# Patient Record
Sex: Male | Born: 1945
Health system: Southern US, Community
[De-identification: ages and names within clinical notes are randomized; demographics above are authoritative.]

## PROBLEM LIST (undated history)

## (undated) DIAGNOSIS — C801 Malignant (primary) neoplasm, unspecified: Secondary | ICD-10-CM

## (undated) DIAGNOSIS — I739 Peripheral vascular disease, unspecified: Secondary | ICD-10-CM

## (undated) DIAGNOSIS — M79606 Pain in leg, unspecified: Secondary | ICD-10-CM

## (undated) DIAGNOSIS — I714 Abdominal aortic aneurysm, without rupture, unspecified: Secondary | ICD-10-CM

## (undated) DIAGNOSIS — E785 Hyperlipidemia, unspecified: Secondary | ICD-10-CM

## (undated) DIAGNOSIS — I10 Essential (primary) hypertension: Secondary | ICD-10-CM

## (undated) DIAGNOSIS — I219 Acute myocardial infarction, unspecified: Secondary | ICD-10-CM

## (undated) DIAGNOSIS — C44300 Unspecified malignant neoplasm of skin of unspecified part of face: Secondary | ICD-10-CM

## (undated) DIAGNOSIS — I252 Old myocardial infarction: Secondary | ICD-10-CM

## (undated) DIAGNOSIS — I2510 Atherosclerotic heart disease of native coronary artery without angina pectoris: Secondary | ICD-10-CM

## (undated) HISTORY — DX: Abdominal aortic aneurysm, without rupture, unspecified: I71.40

## (undated) HISTORY — DX: Atherosclerotic heart disease of native coronary artery without angina pectoris: I25.10

## (undated) HISTORY — DX: Old myocardial infarction: I25.2

## (undated) HISTORY — PX: CARDIAC CATHETERIZATION: SHX172

## (undated) HISTORY — DX: Peripheral vascular disease, unspecified: I73.9

## (undated) HISTORY — DX: Essential (primary) hypertension: I10

## (undated) HISTORY — DX: Malignant (primary) neoplasm, unspecified: C80.1

## (undated) HISTORY — DX: Hyperlipidemia, unspecified: E78.5

## (undated) HISTORY — DX: Unspecified malignant neoplasm of skin of unspecified part of face: C44.300

## (undated) HISTORY — PX: CORONARY ANGIOPLASTY WITH STENT PLACEMENT: SHX49

## (undated) HISTORY — DX: Acute myocardial infarction, unspecified: I21.9

## (undated) HISTORY — DX: Abdominal aortic aneurysm, without rupture: I71.4

## (undated) HISTORY — DX: Pain in leg, unspecified: M79.606

## (undated) HISTORY — PX: CORONARY ARTERY BYPASS GRAFT: SHX141

---

## 2003-11-21 DIAGNOSIS — C801 Malignant (primary) neoplasm, unspecified: Secondary | ICD-10-CM

## 2003-11-21 HISTORY — DX: Malignant (primary) neoplasm, unspecified: C80.1

## 2008-10-24 ENCOUNTER — Inpatient Hospital Stay (HOSPITAL_COMMUNITY): Admission: EM | Admit: 2008-10-24 | Discharge: 2008-10-29 | Payer: Self-pay | Admitting: Emergency Medicine

## 2008-10-24 DIAGNOSIS — I219 Acute myocardial infarction, unspecified: Secondary | ICD-10-CM

## 2008-10-24 HISTORY — DX: Acute myocardial infarction, unspecified: I21.9

## 2008-10-25 ENCOUNTER — Ambulatory Visit: Payer: Self-pay | Admitting: Surgery

## 2008-11-16 ENCOUNTER — Ambulatory Visit: Payer: Self-pay | Admitting: Surgery

## 2008-11-16 ENCOUNTER — Encounter: Admission: RE | Admit: 2008-11-16 | Discharge: 2008-11-16 | Payer: Self-pay | Admitting: Surgery

## 2008-11-26 ENCOUNTER — Ambulatory Visit: Payer: Self-pay | Admitting: Surgery

## 2008-11-26 ENCOUNTER — Ambulatory Visit (HOSPITAL_COMMUNITY): Admission: RE | Admit: 2008-11-26 | Discharge: 2008-11-26 | Payer: Self-pay | Admitting: Surgery

## 2008-11-26 HISTORY — PX: OTHER SURGICAL HISTORY: SHX169

## 2008-12-14 ENCOUNTER — Ambulatory Visit: Payer: Self-pay | Admitting: Surgery

## 2008-12-23 ENCOUNTER — Encounter (HOSPITAL_COMMUNITY): Admission: RE | Admit: 2008-12-23 | Discharge: 2009-01-22 | Payer: Self-pay | Admitting: Cardiovascular Disease

## 2009-01-18 ENCOUNTER — Ambulatory Visit: Payer: Self-pay | Admitting: Surgery

## 2009-01-22 ENCOUNTER — Encounter (HOSPITAL_COMMUNITY): Admission: RE | Admit: 2009-01-22 | Discharge: 2009-02-21 | Payer: Self-pay | Admitting: Cardiovascular Disease

## 2009-02-22 ENCOUNTER — Encounter (HOSPITAL_COMMUNITY): Admission: RE | Admit: 2009-02-22 | Discharge: 2009-03-24 | Payer: Self-pay | Admitting: Cardiovascular Disease

## 2009-05-31 ENCOUNTER — Ambulatory Visit: Payer: Self-pay | Admitting: Surgery

## 2010-07-06 ENCOUNTER — Ambulatory Visit: Payer: Self-pay | Admitting: Cardiovascular Disease

## 2010-10-03 ENCOUNTER — Ambulatory Visit: Payer: Self-pay | Admitting: Surgery

## 2011-01-09 ENCOUNTER — Ambulatory Visit (INDEPENDENT_AMBULATORY_CARE_PROVIDER_SITE_OTHER): Payer: BC Managed Care – PPO | Admitting: Cardiovascular Disease

## 2011-01-09 DIAGNOSIS — E7800 Pure hypercholesterolemia, unspecified: Secondary | ICD-10-CM

## 2011-01-09 DIAGNOSIS — I119 Hypertensive heart disease without heart failure: Secondary | ICD-10-CM

## 2011-03-06 LAB — POCT I-STAT, CHEM 8
Calcium, Ion: 1.16 mmol/L (ref 1.12–1.32)
Glucose, Bld: 103 mg/dL — ABNORMAL HIGH (ref 70–99)
HCT: 42 % (ref 39.0–52.0)
Hemoglobin: 14.3 g/dL (ref 13.0–17.0)
TCO2: 27 mmol/L (ref 0–100)

## 2011-04-04 NOTE — Discharge Summary (Signed)
NAMEGAYNOR, FERRERAS               ACCOUNT NO.:  [id number]   MEDICAL RECORD NO.:  [id number]          PATIENT TYPE:  INP   LOCATION:  6527                         FACILITY:  [facility]   PHYSICIAN:  Vesta Mixer, M.D. DATE OF BIRTH:  [birth year]   DATE OF ADMISSION:  10/24/2008  DATE OF DISCHARGE:  10/29/2008                               DISCHARGE SUMMARY   Donald Mckinney is a 65 year old gentleman who was admitted from the  emergency room on October 24, 2008, with an acute inferior wall  myocardial infarction.   DISCHARGE DIAGNOSES:  1. Inferior wall myocardial infarction - status post percutaneous      transluminal coronary angioplasty and stent placement to the right      coronary artery.  He is also status post percutaneous transluminal      coronary angioplasty and stent placement to the left and mid left      anterior descending artery.  2. Dyslipidemia.  3. History of cigarette smoking.  4. Abdominal aortic aneurysm.  5. Moderate-to-severe peripheral vascular disease involving the distal      aorta and bilateral iliacs.   DISCHARGE MEDICATIONS:  1. Aspen 81 mg a day.  2. Plavix 75 mg a day.  3. Nitroglycerin 0.4 mg sublingually as needed for chest pain.  4. Crestor 20 mg at night.  5. Nicoderm patch 14 mg a day and taper as directed by the      instructions.  6. Carvedilol 3.125 mg twice a day.  7. Xanax 0.5 mg in a tapered fashion.   DISPOSITION:  The patient will see Dr. Elease Hashimoto in 1-2 weeks.  He will see  Dr. Myra Gianotti in 1-2 weeks as well.  He is to call for appointments on  both of those.  We will arrange for him to be seen at the [facility]  Cardiac Rehab Department.   HISTORY:  Donald Mckinney is a 65 year old gentleman with a strong family  history of coronary artery disease and hypercholesterolemia.  He has a  long history of cigarette smoking.  He was admitted through the  emergency room as an unassigned patient.  We were asked to seen by the  emergency room  doctors.  He was found to have an acute ST-segment  elevation myocardial infarction.   That evening he underwent successful PTCA and placement of a 2.5 x 32-mm  Taxus stent.  It was postdilated using a 2.75 Voyager [place] up to 16  atmospheres.   At that time, he was noted to have a tight mid left anterior descending  artery stenosis of about 70-80%.  We also noted that he had significant  peripheral vascular disease and had great difficulty in getting up into  a central circulation.   We allowed him to recover from his heart attack and then on October 28, 2008, we proceeded with PTCA and stenting of the left anterior  descending artery with placement of a 2.75 x 15 mm Xience drug-eluting  stent.  Poststent dilatation was achieved using a 3.0 x 12 mm [place] Voyager  up to 12 atmospheres.   The patient  tolerated that procedure quite well.  He will follow up with  Dr. Elease Hashimoto.  He has been instructed to take Plavix for at least a year  or so.   Peripheral vascular disease.  The patient has a long history of  claudication.  He was seen by Dr. Durene Cal.  At the end of the left  anterior descending artery PCI case, we performed a distal aortogram,  which revealed a moderate-sized infrarenal abdominal aortic aneurysm.  He had a stenosis just distal to this aneurysm and then had moderate-to-  severe bilateral peripheral vascular disease involving the entire iliac  arteries.  The disease extended down into the femoral arteries, but we  were not able to see any further than that.  The patient will follow up  with Dr. Myra Gianotti and will quite likely need to have further  investigations and quite possibly may need revascularization.   Hyperlipidemia.  The patient has a long history of hypercholesterolemia,  but has never taken cholesterol medications.  Fasting lipid level  revealed an LDL of 260.  We started him on Crestor 20 mg a day.  He was  scared to take statins because he had multiple  brothers who did not  tolerate statins but so far he seems to be tolerating it fairly well.   Smoking cessation.  The patient smokes 1-2 packs of cigarettes a day.  We placed a nicotine patch on him and gave him some Ativan and he  tolerated this fairly well.  He will continue with the nicotine patch  and tapering Xanax dose.   Alcohol abuse.  The patient has a long history of alcoholism.  We will  continue with the Xanax and he will gradually taper that dose.  He has  been instructed to drink alcohol only in moderation.  All his other  medical problems are stable.      Vesta Mixer, M.D.  Electronically Signed     PJN/MEDQ  D:  10/29/2008  T:  10/29/2008  Job:  [id number]   cc:   Jorge Ny, MD  [facility] Cardiac Rehab Department

## 2011-04-04 NOTE — Assessment & Plan Note (Signed)
OFFICE VISIT   FENIX, RORKE  DOB:  Jul 28, 1946                                       01/18/2009  UEAVW#:[id number]   REASON FOR VISIT:  Follow-up.   HISTORY:  This is a 65 year old gentleman who initially presented to the  emergency department with an acute MI.  He underwent coronary  intervention.  During his hospital stay he was worked up for a  chronically ischemic right leg.  He has undergone arteriogram on January  7th and found to have high-grade stenosis at the origin of his iliac  arteries for which he underwent kissing stent placement.  His external  iliac artery was also stented.  The patient does have residual lower  extremity disease, specifically in his profunda and superficial femoral  arteries.  Due to his coronary stent placement, he is on Plavix.  I do  not think he has additional percutaneous options, his next option would  be an open repair.  When I last saw him he had improved from a lower  extremity standpoint, I elected to manage him medically and we added  cilostazol to his medication regimen.  He comes back in today to see if  this has had any benefit.  He states that he is still having some  trouble with his right foot; however, he is able to participate in  cardiac rehab.  He occasionally has rest pain.  He does not have any  ulceration.   PHYSICAL EXAMINATION:  He is well-appearing, in no distress.  His blood  pressure is 138/78, pulse is 77.  His feet are warm and well-perfused.  He does not have palpable pulses.  There is no ulceration.   DIAGNOSTIC STUDIES:  Ankle brachial index was performed today, this is  0.32 on the right, which is up from undetectable, and 0.62 on the left,  which is up from 0.58.  The patient also underwent a carotid duplex  which reveals no evidence of internal carotid stenosis bilaterally.   ASSESSMENT/PLAN:  Right lower extremity peripheral vascular disease.   PLAN:  The patient continues to get  by, with regards to his peripheral  vascular disease.  I think his intervention will need to be a surgical  bypass.  I would prefer this to be an aortobifemoral, however, the  timing at which this needs to be done he may require an ax-bi-fem.  He  potentially could require an additional femoral popliteal bypass to help  with his symptoms.  Since he has been stable with his symptoms, I think  we can continue to manage him medically and expectantly.  He is going to  come back to see me in 3 months.  If he has any problems in the interim  he will contact me; specifically, if he has any ulceration.  He is going  to continue cilostazol for another month.   Jorge Ny, MD  Electronically Signed   VWB/MEDQ  D:  01/18/2009  T:  01/19/2009  Job:  [id number]   cc:   Vesta Mixer, M.D.

## 2011-04-04 NOTE — Assessment & Plan Note (Signed)
OFFICE VISIT   Donald Mckinney, Donald Mckinney  DOB:  Dec 11, 1945                                       11/16/2008  ZOXWR#:[id number]   REASON FOR VISIT:  Leg pain.   HISTORY:  This is a 65 year old gentleman who presented to the emergency  department with acute MI.  He was taken for percutaneous transluminal  coronary angioplasty and stent placement to the right coronary artery.  Following his procedure, the patient began complaining of pain in his  right foot.  I was consulted for possible ischemic leg.  Upon my  questioning the patient, he stated that he has been dealing with rest  pain for several months, to the point that it keeps him awake most  nights and requires him to hang his leg over the bed for relief.  We  kept a close eye on his legs, which did not change.  He was  neurologically intact throughout these events.  He additionally  underwent stenting of his left and mid left anterior descending artery  several days later, and was discharged to home on Plavix.  The patient  states that his rest pain has improved somewhat, although he still is  unable to participate in cardiac rehab given his claudication.  The  patient had a CT scan prior to seeing me today.   PHYSICAL EXAMINATION:  He is in no acute distress.  Cardiovascular:  Regular rate and rhythm.  Extremities are warm and well perfused.  Abdomen is soft.   CT SCAN:  The patient's CT scan shows extensive atherosclerotic disease  in his lower extremity.  He had multilevel disease.   ASSESSMENT AND PLAN:  Bilateral claudication, right leg rest pain.   PLAN:  The patient is scheduled to undergo lower extremity arteriogram  with possible percutaneous intervention.  The patient has common and  external iliac disease.  I discussed with him he will most likely need  bilateral access to potentially extend his aortic bifurcation.  I plan  on just treating his inflow disease at this time and getting better  detail of his outflow disease and runoff.  We discussed the risk of  bleeding, contrast, complications, and embolization.  The patient is on  the schedule for Thursday, January 7.  He is supposed to see Dr. Elease Hashimoto  on January 12.  As long as Dr. Elease Hashimoto is okay with me proceeding on this  date, we will go ahead.  The patient is going to contact Dr. Elease Hashimoto and  call me to reschedule if he would like to wait until he sees him before  preceding with lower extremity intervention.  The patient is already on  Plavix, so he will not need to be loaded.   Jorge Ny, MD  Electronically Signed   VWB/MEDQ  D:  11/16/2008  T:  11/17/2008  Job:  [id number]   cc:   Vesta Mixer, M.D.

## 2011-04-04 NOTE — Assessment & Plan Note (Signed)
OFFICE VISIT   Mckinney, Donald  DOB:  [birth year]                                       05/31/2009  ZOXWR#:[id number]   REASON FOR VISIT:  Followup.   HISTORY:  This is a 65 year old gentleman who I initially met when he  presented with an MI.  He underwent percutaneous coronary intervention.  He developed a painful right leg following his procedure.  On further  evaluation, he has chronically had rest pain; however, it was more  pronounced after his cardiac catheterization.  He did undergo lower  extremity arteriogram on November 26, 2008 and found to have high-grade  stenosis at the origin of his bilateral iliac arteries and underwent  kissing stent placement.  He also had stenting of his external iliac  artery.  He was found to have residual lower extremity disease in his  profunda and superficial femoral artery.  Based on his arteriogram, he  was found to not be a candidate for additional percutaneous procedures  and would require an open operation.  Since he has been on Plavix, I  have elected to manage him medically at this time.  He has been started  on cilostazol.  He comes back in today for followup.  He states he is  also doing some form of massage therapy to his right leg which is his  more symptomatic leg.  He is now sleeping through the night and out  playing golf.  Overall I think he is much improved.   PHYSICAL EXAMINATION:  Blood pressure 156/88, pulse 71.  General:  He is  well-appearing, no distress.  His legs are warm and well perfused.  There is no ulceration.   DIAGNOSTIC STUDIES:  ABIs were performed today which reveal ABI of 0.32  on the right and 0.62 on the left.  His bilateral common iliac and right  external iliac stents are widely patent.   ASSESSMENT/PLAN:  Severe peripheral vascular disease.   PLAN:  We will continue to manage the patient medically.  As long as he  is continuing to be without rest pain and without nonhealing  wounds, I  would not recommend intervention until he can come off his Plavix which  will likely be in the distant future.  I plan on seeing him back in a  year with repeat ankle-brachial indices.  Should he develop worsening  symptoms prior to that, he has been instructed to contact me.   Jorge Ny, MD  Electronically Signed   VWB/MEDQ  D:  05/31/2009  T:  06/01/2009  Job:  [id number]   cc:   Vesta Mixer, M.D.

## 2011-04-04 NOTE — H&P (Signed)
Donald Mckinney               ACCOUNT NO.:  [id number]   MEDICAL RECORD NO.:  [id number]          PATIENT TYPE:  INP   LOCATION:  2914                         FACILITY:  [facility]   PHYSICIAN:  Vesta Mixer, M.D. DATE OF BIRTH:  Feb 20, 1946   DATE OF ADMISSION:  10/24/2008  DATE OF DISCHARGE:                              HISTORY & PHYSICAL   Donald Mckinney is a 65 year old gentleman who is admitted from the ER for  an acute inferior wall myocardial infarction.  He filled up on  unassigned cardiology call.   Donald Mckinney does not have a regular medical doctor.  He knows that he  has a history of hypercholesterolemia and he has a strong family history  of hypercholesterolemia.  He presented to the hospital after a sudden  episode of syncope.  The episode of syncope happened around 7 o'clock.  Following that, he had some diffuse chest pressure that was described as  about 5/10.  The pressure was in the middle of his chest.  There was  some diaphoresis and some shortness of breath.  The pain did not  resolve.  He called his family who ultimately called the EMS.  He was  found to have acute ST-segment elevation in the inferior leads and was  brought emergently to [facility].   The patient denies any previous episodes of syncope or presyncope.  He  denies any PND or orthopnea.   CURRENT MEDICATIONS:  None.   PAST MEDICAL HISTORY:  Hypercholesterolemia.   SOCIAL HISTORY:  The patient smokes 2 packs of cigarettes a day.  He  drinks 5-6 alcoholic beverages a day.  He works as a Visual merchandiser.  He used to  work in [employer].   FAMILY HISTORY:  Strongly positive for coronary artery disease and  hypercholesterolemia.   REVIEW OF SYSTEMS:  All systems are reviewed and are negative.   PHYSICAL EXAMINATION:  GENERAL:  He is a middle-aged gentleman in no  acute distress.  He is alert and oriented x3.  His mood and affect are  normal.  VITAL SIGNS:  His blood pressure is 100/60, heart  rate 60.  HEENT:  No JVD, no thyromegaly.  He is normocephalic and atraumatic.  His sclerae are nonicteric.  LUNGS:  Clear.  HEART:  Regular rate, S1 and S2.  ABDOMEN:  Nontender.  EXTREMITIES:  He has 1+ pulses on the right side.  He has 1+ pulses  distally.  NEUROLOGIC:  Nonfocal.   His EKG reveals normal sinus rhythm.  He has ST-segment elevation  inferiorly.   IMPRESSION AND PLAN:  Acute myocardial infarction.  The patient presents  with syncope and episodes of chest pain.  He is clearly having an acute  inferior wall myocardial infarction.  We have discussed the risks,  benefits, and options concerning heart catheterization.  He understands  and agrees to go to the cath lab.  Further plans following heart  catheterization.  All of his other medical problems remain fairly  stable.      Vesta Mixer, M.D.  Electronically Signed     PJN/MEDQ  D:  10/25/2008  T:  10/25/2008  Job:  [id number]

## 2011-04-04 NOTE — Assessment & Plan Note (Signed)
OFFICE VISIT   Donald Mckinney, Donald Mckinney  DOB:  03-27-1946                                       10/03/2010  ZOXWR#:[id number]   REASON FOR VISIT:  Followup claudication.   HISTORY:  This is a 65 year old gentleman who suffered an MI in  December/January of 2010.  He underwent a cardiac catheterization for  this and in the recovery phase he was found to have significant rest  pain in his right leg.  He has ultimately undergone stenting of his  right external iliac artery.  He does have residual disease in his leg,  however, he has been without symptoms.  He is back playing golf and  working.  He does not have rest pain.  He is taking cilostazol.   PHYSICAL EXAMINATION:  Vital signs:  Heart rate 76, blood pressure of  155/83, respiratory rate 18.  General:  He is well-appearing, in no  distress.  Legs are warm and well-perfused.  Pedal pulses are not  palpable.  There is no edema.   DIAGNOSTIC STUDIES:  ABIs were reviewed today.  His ABIs are stable from  his previous study, the right is 0.42, the left is 057.   ASSESSMENT AND PLAN:  Status post right external iliac stent.  The  patient is asymptomatic at this time with stable ABIs.  We will continue  to follow him yearly.  If the symptoms change he is instructed to  contact me.     Jorge Ny, MD  Electronically Signed   VWB/MEDQ  D:  10/03/2010  T:  10/04/2010  Job:  [id number]   cc:   Vesta Mixer, M.D.

## 2011-04-04 NOTE — Cardiovascular Report (Signed)
[name], Mckinney               ACCOUNT NO.:  [id number]   MEDICAL RECORD NO.:  [id number]          PATIENT TYPE:  INP   LOCATION:  2914                         FACILITY:  [facility]   PHYSICIAN:  Vesta Mixer, M.D. DATE OF BIRTH:  12/24/1945   DATE OF PROCEDURE:  DATE OF DISCHARGE:                            CARDIAC CATHETERIZATION   Donald Mckinney is a 65 year old gentleman with a history of  hypercholesterolemia and cigarette smoking.  He presents today after  having an episode of syncope.  His family called EMS and was found to  have an inferior wall myocardial infarction.  He was brought urgently to  the cath lab for further evaluation.   The procedure was left heart catheterization with coronary angiography  with PTCA and stenting of the right coronary artery.   The right femoral artery was easily cannulated using modified Seldinger  technique.  There was significant plaque in his iliac artery and  required a Wholey wire to get up into the central circulation.  The  catheters were exchanged over a guidewire.   HEMODYNAMIC:  LV pressure is 90/9 with aortic pressure of 90/54.   Angiography left main, the left main is fairly normal.   The left anterior descending artery has a 70-80% eccentric stenosis in  the proximal segment.  This is right at the takeoff of the first  diagonal artery.  The first diagonal artery has mild irregularities and  about a 50% proximal stenosis at its takeoff.  The second diagonal  artery is unremarkable.  The distal LAD is fairly unremarkable.   A left circumflex artery is fairly normal.  It gives off a large first  obtuse marginal artery which is normal.   The right coronary artery is occluded.   The left ventriculogram was actually performed at the end of the case.  Reveals overall well preserved left ventricular systolic function.  Ejection fraction is around 55%.  There is no mitral regurgitation.   PCI of the right coronary artery was  engaged using the Judkins 6-French  JR-4 side-hole guide.  Angiography revealed an occluded right coronary  artery in its proximal segment.  Angiomax was given.  ACT initially was  215.  Another bolus was given at the Angiomax.  The second ACT was 327.   A Prowater angioplasty wire was positioned down into the distal right  coronary artery.  A 2.5 x 15-mm apex was positioned across the stenosis.  We actually achieved reperfusion with the wire.  We were able to open up  the vessel and then the patient had some bradycardia.  We quickly  reinflated the balloon and then did a series of 10 very short inflations  with very short reperfusion time somewhat in an effort to allow the  patient to reperfuse slowly.  He did have some bradycardia and sinus  pauses.  We gave atropine 1 mg IV which corrected the problem.   After the patient had stabilized and his rhythm had stabilized, we took  a 2.5 x 32 mm Taxus stent.  It was placed across the entire mid vessel  and was deployed  at 14 atmospheres for 23 seconds.  Post stent  dilatation was achieved using a 2.75 x 20 mm Voyager [place].  It was  positioned distally and inflated up to 12 atmospheres for 18 seconds.  It was then brought back into the middle of the stent and inflated up to  18 atmospheres for 16 seconds and then 22 atmospheres for 25 seconds.  This gave [place] a very nice angiographic result of the stent.   There seemed to be a dissection that extended from the midvessel down  into the distal vessel.  The distal aspect of this flap was still  visible, but otherwise fairly normal segment.  We watched this segment  for a while and did not seem to obstruct flow.  Our plan is to treat  this with Plavix and let heal upon its own.  The patient will need to be  brought back for intervention on that as his left anterior descending  artery.  We will follow up with restudy of his distal right coronary  artery at that time.  We will continue to treat with  Plavix.   The patient will need aggressive therapy with Crestor as he has a  history of hypercholesterolemia and a significant coronary artery  disease.      Vesta Mixer, M.D.  Electronically Signed     PJN/MEDQ  D:  10/24/2008  T:  10/25/2008  Job:  [id number]

## 2011-04-04 NOTE — Op Note (Signed)
Donald Mckinney, Donald Mckinney               ACCOUNT NO.:  [id number]   MEDICAL RECORD NO.:  [id number]          PATIENT TYPE:  AMB   LOCATION:  SDS                          FACILITY:  [facility]   PHYSICIAN:  Juleen China IV, MDDATE OF BIRTH:  10-04-1946   DATE OF PROCEDURE:  11/26/2008  DATE OF DISCHARGE:  11/26/2008                               OPERATIVE REPORT   PREOPERATIVE DIAGNOSIS:  Right leg rest pain.   POSTOPERATIVE DIAGNOSIS:  Right leg rest pain.   PROCEDURES PERFORMED:  1. Ultrasound access, left femoral artery.  2. Ultrasound access, right common femoral artery.  3. Abdominal aortogram.  4. Second-order catheterization (right external iliac artery).  5. Catheter in aorta from the right leg.  6. Right lower extremity runoff.  7. Stent, right common iliac artery.  8. Stent, left common iliac artery.  9. Stent, right external iliac artery.   CONSCIOUS SEDATION:  Times 1 hour from 12:04 to 1:04.   INDICATIONS:  This is a 65 year old gentleman who presented with an  acute MI and underwent subsequent cardiac stenting by Dr. Elease Hashimoto.  During his stay, he was having difficulty with right foot pain which was  a subacute problem for him.  He was discharged from the hospital after  recovering and comes in for further evaluation of his right leg.  I did  got a CT scan in the hospital which shows inflow, outflow, and runoff  disease.  He comes in today for an arteriogram and possible intervention  to better assist him and his ability to participate in cardiac rehab.   PROCEDURE IN DETAIL:  The patient was identified in the holding and  taken to room 8.  He was placed supine on the table.  Bilateral groins  were prepped and draped in standard sterile fashion.  Time-out was  called.  The left femoral artery was divided with ultrasound and found  to be patent.  Lidocaine 1% was used for local anesthesia.  The left  femoral artery was accessed under ultrasound guidance with an 18-gauge  needle.  A 0.035 Bentson wire was advanced into the aorta under  fluoroscopic visualization and a 5-French sheath was placed.  Over the  wire, Omni flush catheter was placed to the level of L1 and abdominal  aortogram was obtained.  Next, the catheter was pulled down to the  aortic bifurcation and pelvic angiogram was obtained.  Using the Omni  flush catheter and Bentson wire, the aortic bifurcation was crossed.  The catheter was placed in the right external iliac artery and right  lower extremity runoff was obtained.   FINDINGS:  Abdominal aortogram:  The visualized portions of suprarenal  abdominal aorta showed minimal disease.  There are single renal arteries  bilaterally which are widely patent.  There is mild disease within the  infrarenal abdominal aorta and the distal aorta is ectatic.   Pelvic angiogram:  There is stenosis at the origin of the right and left  common iliac artery.  There is mild diffuse disease throughout the right  common iliac artery.  The right internal iliac artery is widely  patent.  The right external iliac artery is diffusely diseased.  The left iliac  artery is patent after its origin of stenosis.  The left hypogastric  artery is diffusely diseased but patent.  There is diffuse disease  throughout the left external iliac artery.   Right lower extremity:  The right common femoral artery is patent.  The  right superficial femoral and profunda femoral artery are occluded.  There is reconstitution of the profunda femoral artery just distal to  its origin.  The popliteal artery above the knee reconstitutes, however,  it is diffusely diseased.  The below-knee popliteal artery is patent.  The patient appears to have three-vessel runoff, however, this was a  somewhat limited evaluation due to proximal disease and patient  movement.   INTERVENTION:  At this point in time, I elected to proceed with treating  the patient's inflow disease.  The right femoral artery  was evaluated  with ultrasound and found to be patent.  Lidocaine 1% was used for local  anesthesia.  It was accessed under ultrasound guidance with an 18-gauge  needle.  Using a Bentson wire, access was obtained into the common iliac  artery and a 7-French sheath was then placed.  Using a Kumpe catheter,  wire access was obtained into the aorta.  Wire exchange was then  performed on both sides to place a Rosen wire and a 7-French sheath was  placed from the left groin.  I elected to place kissing iliac stents.  Abbott balloon expandable stents measuring 9 x 28 were placed  bilaterally.  Balloon was taken to 12 atmospheres.  Followup study  revealed significantly improved blood flow across the aortic  bifurcation.  Next, I elected to treat the right external iliac artery.  This was done using a Abbott self-expanding stent 7 x 60.  It was molded  to confirmation with a 6-mm balloon.  The stent landed at the takeoff of  the hypogastric artery and came down into the external iliac artery.  The patient's followup study revealed significantly improved results.  The patient still has a femoral disease which is best not treated  percutaneously.  However, due to the limitations he was with his recent  cardiac history, I think his exercise tolerance will be improved and he  should relieved of his rest pain.   Please note that the patient was given systemic heparinization while the  7-French sheath was placed.  After these results, the patient was taken  to the holding area for sheath pull once his coagulation profile  corrected.   IMPRESSION:  1. Aortic bifurcation stenosis, successfully treated using balloon      expandable 9 x 28 kissing iliac stents.  2. Diffuse disease throughout the right external iliac artery treated      with a self-expanding 7 x 60 stent.  3. Occluded superficial femoral and profunda femoral artery on the      right.  There is reconstitution of the profunda distal to  its      takeoff.  There is reconstitution of the below-knee popliteal      artery with a three-vessel runoff.           ______________________________  V. Charlena Cross, MD  Electronically Signed     VWB/MEDQ  D:  11/26/2008  T:  11/27/2008  Job:  [id number]   cc:   Vesta Mixer, M.D.

## 2011-04-04 NOTE — Cardiovascular Report (Signed)
NAMETRACE, WIRICK               ACCOUNT NO.:  [id number]   MEDICAL RECORD NO.:  [id number]          PATIENT TYPE:  INP   LOCATION:  6527                         FACILITY:  [facility]   PHYSICIAN:  Vesta Mixer, M.D. DATE OF BIRTH:  [birth year]   DATE OF PROCEDURE:  10/28/2008  DATE OF DISCHARGE:                            CARDIAC CATHETERIZATION   HISTORY:  Donald Mckinney is a 65 year old gentleman who presented several  days ago with an acute inferior myocardial epicardium.  He underwent  sick successful emergent PTCA and stenting of his right coronary artery.  At that time, he was noted to have a significant stenosis in his left  anterior descending artery.  We also noticed that he had severe  peripheral vascular disease and we had great difficulty getting up into  the central circulation.  He is brought back today for the second of his  stage procedure for angioplasty of the left anterior descending artery.  We also wanted to check the right coronary artery since he had a  dissection at the end of the case.  We also wanted to do an aortogram  for further evaluation of his peripheral vascular disease.   The left femoral artery was easily cannulated using modified Seldinger  technique.  We had difficulty in getting up with the curved J-wire, so a  straight Glidewire was used.  We were able to pass it without difficulty  into the central circulation.  All catheters were exchanged over the  guidewire.   Aortic pressure was 89/52.   A right diagnostic catheter was placed into the right coronary artery.  This revealed a widely patent stent with no residual restenosis.  There  is no edge dissection.  The question of the dissection flap that was in  the distal right coronary artery has now healed and there is no evidence  of any linear dissection.  There is brisk flow down the right coronary  artery.   The right coronary catheter was traded out for left 4 guide.  Angiomax  was given.   The ACT was 343.  Prowater angioplasty wire was placed down  into the distal left anterior descending artery.   Angiography revealed a tight 80% stenosis in the mid left anterior  descending artery just between the first and second diagonal vessels.   Initial pre-dilatation was achieved using a 2.7, 5 x 12 mm apex balloon.  We inflated it up to 6 atmospheres for 30 seconds.  At this point, a  2.75 x 15 mm Xience (drug-eluting stent) was positioned across the  stenosis.  It was deployed at 12 atmospheres for 36 seconds.   Following this, post-stent dilatation was achieved using a 3.0 x 12 mm  Voyager.  We inflated it distally up to 12 mm for 15 seconds and then  proximally 12 mm for 15 seconds giving a final size of 3.01 mm.  This  gave [place] a very nice angiographic result.  There was no evidence of edge  dissection and there appears to be full apposition.  There is brisk flow  down each of the diagonal arteries.  Following this, distal aortogram was performed at 45 mL at a rate of 12  mL per second.  The distal aortogram revealed patent renal arteries.  He  has a small to moderate sized abdominal aortic aneurysm just before the  bifurcation.  There is then a stenosis in the aorta.  There is severe  and diffuse disease involving both iliac arteries.  We could not come  down much further than the iliac arteries.   COMPLICATIONS:  None.   CONCLUSION:  1. Successful PTCA and stenting of the left anterior descending artery      (mid LAD).  2. Good short-term result of the right coronary artery stent based on      October 24, 2008.  The distal dissection flap has now healed      nicely.  3. Significant peripheral vascular disease with an abdominal aortic      aneurysm as well as an aortic stenosis and severe bilateral iliac      artery disease.  We will continue to treat the patient medically.      He needs aggressive cholesterol reduction.  He may have stopped      smoking.  He  certainly has not smoked in the hospital.  He will      need further vascular studies by Dr. Myra Gianotti.      Vesta Mixer, M.D.  Electronically Signed     PJN/MEDQ  D:  10/28/2008  T:  10/28/2008  Job:  [id number]   cc:   Jorge Ny, MD

## 2011-04-04 NOTE — Procedures (Signed)
AORTA-ILIAC DUPLEX EVALUATION   INDICATION:  Follow up lower extremity revascularization.   HISTORY:  Diabetes:  No.  Cardiac:  MI.  Hypertension:  No.  Smoking:  Previous.  Previous Surgery:  On 11/26/08, bilateral common iliac artery and right  external iliac artery stents.               SINGLE LEVEL ARTERIAL EXAM                              RIGHT                  LEFT  Brachial:                  152                    151  Anterior tibial:           56                     84  Posterior tibial:                                 89  Peroneal:                  44  Ankle/brachial index:      0.37                   0.59  Previous ABI/date:         01/18/09, 0.32         01/18/09, 0.62   AORTA-ILIAC DUPLEX EXAM  Aorta - Proximal     69 cm/s  Aorta - Mid          52 cm/s  Aorta - Distal       84 cm/s   RIGHT                                   LEFT  85 cm/s           CIA-PROXIMAL          124 cm/s  133 cm/s          CIA-DISTAL            159 cm/s  202 cm/s          HYPOGASTRIC           261 cm/s  119 cm/s          EIA-PROXIMAL          242 cm/s  61 cm/s           EIA-MID               171 cm/s  79 cm/s           EIA-DISTAL            222 cm/s   IMPRESSION:  1. Bilateral ankle brachial indices appear stable from previous study.  2. Patent bilateral common iliac artery and right external iliac      artery stents.  3. Mildly elevated velocities in the left external iliac artery and      bilateral hypogastric arteries.   ___________________________________________  V. Charlena Cross, MD   AS/MEDQ  D:  05/31/2009  T:  05/31/2009  Job:  [id number]

## 2011-04-04 NOTE — Assessment & Plan Note (Signed)
OFFICE VISIT   Donald Mckinney, Donald Mckinney  DOB:  Jul 16, 1946                                       12/14/2008  YQMVH#:[id number]   REASON FOR VISIT:  Follow-up.   HISTORY:  This is a 65 year old gentleman who initially presented to the  emergency department with an acute MI.  He underwent percutaneous  transluminal coronary angioplasty and stent placement in his right  coronary artery.  Following this, there is concern over an ischemic leg.  Upon my questioning the patient had been dealing with rest pain for  several months to the point where it was keeping him up most nights and  requiring him to hang his foot over the bed for relief.  We kept a very  close eye on his right leg and it did not change throughout his stay.  He was neurologically intact with exception of some mild sensation  changes.  Several days later he underwent stenting of his left and mid  left anterior descending artery.  He was discharged home on Plavix.  I  took him to the angiogram suite for evaluation of his lower extremity on  November 26, 2008.  At that time high-grade lesions were found at the  origin of his right common iliac artery.  He underwent kissing iliac  stents.  He also had a severe stenosis throughout his right external  iliac artery, which was subsequently stented.  Following this the  patient states that he had improvement of his rest pain symptoms for  approximately 1 week at which time, he began requiring awakening at  night and hanging his foot over the bed to improve his discomfort.  He  comes back in today for his follow-up.   PHYSICAL EXAMINATION:  VITAL SIGNS:  Blood pressure 147/84, pulse 78,  respirations 18.  GENERAL:  He is well-appearing in no distress.  CARDIOVASCULAR:  Regular rate and rhythm.  His groins are well-healed.  Extremities are warm and well-perfused, there were no ulcerations.   DIAGNOSTIC STUDIES:  The patient had Doppler ultrasound today, no  signals  are recorded in his anterior tibial and posterior tibial artery  on the right.  His ankle brachial index on the left was 0.58.   ASSESSMENT/PLAN:  Severe peripheral vascular disease, right greater than  left.   PLAN:  I had a long discussion with the patient and his wife regarding  the complex nature of his symptoms.  I am concerned that he did not have  significant relief from the stenting of his aortoiliac segment.  I am  concerned that he may have developed early stenosis within the stents  placed.  That does not bode well for our options from a percutaneous  perspective.  The patient does have a palpable right femoral pulse and  therefore I am confident that the stenting of his iliac system is  patent.  However, he has either had progression of his distal disease or  is now back to his baseline from in-stent stenosis.  Regardless, I do  not think he will do well with further endovascular therapy and is more  likely to need open surgical therapy.  Since he has recently had an  acute MI and is now on Plavix for coronary stents, I am reluctant to  proceed with surgical revascularization and therefore I think we should  try to manage  him medically.  I have added cilostazol to see if this has  any improvement in his symptoms.   From a minimalist perspective, since he does have an occluded profunda  femoral artery, we could potentially try a groin incision, either under  local, or MAC anesthesia, to minimize anesthetic risks and perform  endarterectomy to open his profunda femoral artery.  He most likely in  the future will require aortobifemoral bypass and femoral below knee  popliteal bypass for resolution of symptoms.  Since at this time he does  not have any tissue loss, I would recommend conservative nonoperative  therapy and we will see him back in 1 month to see if he has had any  improvement from his cilostazol.   Jorge Ny, MD  Electronically Signed   VWB/MEDQ  D:   12/14/2008  T:  12/15/2008  Job:  [id number]   cc:   Vesta Mixer, M.D.

## 2011-04-04 NOTE — Procedures (Signed)
CAROTID DUPLEX EXAM   INDICATION:  Peripheral vascular disease.   HISTORY:  Diabetes:  No.  Cardiac:  No.  Hypertension:  No.  Smoking:  Previous.  Previous Surgery:  History of bilateral iliac artery stents.  CV History:  Currently asymptomatic.  Amaurosis Fugax No, Paresthesias No, Hemiparesis No.                                       RIGHT             LEFT  Brachial systolic pressure:         138               139  Brachial Doppler waveforms:         Normal            Normal  Vertebral direction of flow:        Antegrade         Antegrade  DUPLEX VELOCITIES (cm/sec)  CCA peak systolic                   79                69  ECA peak systolic                   105               90  ICA peak systolic                   78                67  ICA end diastolic                   26                24  PLAQUE MORPHOLOGY:                  Heterogeneous     Heterogeneous  PLAQUE AMOUNT:                      Minimal           Minimal  PLAQUE LOCATION:                    Bifurcation       Bifurcation   IMPRESSION:  No evidence of bilateral internal carotid artery stenosis  noted with minimal plaque seen at the bilateral bifurcation levels.   ___________________________________________  V. Charlena Cross, MD   CH/MEDQ  D:  01/18/2009  T:  01/18/2009  Job:  [id number]

## 2011-04-26 ENCOUNTER — Encounter: Payer: Self-pay | Admitting: Cardiovascular Disease

## 2011-04-27 ENCOUNTER — Other Ambulatory Visit: Payer: Self-pay | Admitting: Cardiology

## 2011-04-27 ENCOUNTER — Ambulatory Visit (INDEPENDENT_AMBULATORY_CARE_PROVIDER_SITE_OTHER): Payer: BC Managed Care – PPO | Admitting: Cardiovascular Disease

## 2011-04-27 ENCOUNTER — Encounter: Payer: Self-pay | Admitting: Cardiovascular Disease

## 2011-04-27 VITALS — BP 172/110 | HR 70 | Ht 67.0 in | Wt 178.4 lb

## 2011-04-27 DIAGNOSIS — E782 Mixed hyperlipidemia: Secondary | ICD-10-CM | POA: Insufficient documentation

## 2011-04-27 DIAGNOSIS — I2510 Atherosclerotic heart disease of native coronary artery without angina pectoris: Secondary | ICD-10-CM

## 2011-04-27 DIAGNOSIS — I10 Essential (primary) hypertension: Secondary | ICD-10-CM

## 2011-04-27 DIAGNOSIS — E785 Hyperlipidemia, unspecified: Secondary | ICD-10-CM

## 2011-04-27 LAB — HEPATIC FUNCTION PANEL
ALT: 26 U/L (ref 0–53)
AST: 24 U/L (ref 0–37)
Albumin: 4.8 g/dL (ref 3.5–5.2)
Alkaline Phosphatase: 50 U/L (ref 39–117)

## 2011-04-27 LAB — LIPID PANEL
Cholesterol: 235 mg/dL — ABNORMAL HIGH (ref 0–200)
Triglycerides: 236 mg/dL — ABNORMAL HIGH (ref 0.0–149.0)

## 2011-04-27 LAB — BASIC METABOLIC PANEL
Calcium: 8.8 mg/dL (ref 8.4–10.5)
Creatinine, Ser: 0.8 mg/dL (ref 0.4–1.5)

## 2011-04-27 LAB — LDL CHOLESTEROL, DIRECT: Direct LDL: 81.9 mg/dL

## 2011-04-27 NOTE — Telephone Encounter (Signed)
Refill done.  

## 2011-04-27 NOTE — Progress Notes (Signed)
[name] Date of Birth  1946/09/15 [facility] / [facility] [address]     [address] [place], [place]  [address] [phone]  Fax  [phone]  History of Present Illness:  No complaints.  No angina or dyspnea with exertion.  BP readings at home are normal.  He has been trying to watch his diet and has lost 7 pounds.  Overall he's doing well.  Current Outpatient Prescriptions on File Prior to Visit  Medication Sig Dispense Refill  . aspirin 81 MG tablet Take 81 mg by mouth daily.        . carvedilol (COREG) 3.125 MG tablet Take 12.5 mg by mouth 2 (two) times daily with a meal.       . cilostazol (PLETAL) 100 MG tablet Take 100 mg by mouth 2 (two) times daily.        . clopidogrel (PLAVIX) 75 MG tablet Take 75 mg by mouth daily.        . Multiple Vitamin (MULTIVITAMIN) tablet Take 1 tablet by mouth daily.        . niacin (NIASPAN) 500 MG CR tablet Take 500 mg by mouth at bedtime.        . nitroGLYCERIN (NITROSTAT) 0.4 MG SL tablet Place 0.4 mg under the tongue every 5 (five) minutes as needed.        . rosuvastatin (CRESTOR) 20 MG tablet Take 20 mg by mouth daily.          Allergies  Allergen Reactions  . Ampicillin   . Iodinated Diagnostic Agents     Past Medical History  Diagnosis Date  . Coronary artery disease   . MI, old     INFERIOR WALL M.I.  . Hyperlipidemia   . Hypertension   . AAA (abdominal aortic aneurysm)   . PVD (peripheral vascular disease)     MODERATE TO SEVERE, INVOLVING THE DISTAL AORTA AND BILATERAL ILIACS    Past Surgical History  Procedure Date  . Cardiac catheterization     WELL PRESERVED LEFT VENTRICULAR SYSTOLIC FUNCTION. EF 55%  . Coronary angioplasty with stent placement     RIGHT CORONARY ARTERY, LEFT ANTERIOR DESCENDING ARTERY    History  Smoking status  . Former Smoker  . Quit date: 10/20/2008  Smokeless tobacco  . Not on file    History  Alcohol Use No    No family history on  file.  Reviw of Systems:  Reviewed in the HPI.  All other systems are negative.  Physical Exam: BP 172/110  Pulse 70  Ht 5\' 7"  (1.702 m)  Wt 178 lb 6.4 oz (80.922 kg)  BMI 27.94 kg/m2 The patient is alert and oriented x 3.  The mood and affect are normal.  The skin is warm and dry.  Color is normal.  The HEENT exam reveals that the sclera are nonicteric.  The mucous membranes are moist.  The carotids are 2+ without bruits.  There is no thyromegaly.  There is no JVD.  The lungs are clear.  The chest wall is non tender.  The heart exam reveals a regular rate with a normal S1 and S2.  There are no murmurs, gallops, or rubs.  The PMI is not displaced.   Abdominal exam reveals good bowel sounds.  There is no guarding or rebound.  There is no hepatosplenomegaly or tenderness.  There are no masses.  Exam of the legs reveal no clubbing, cyanosis, or edema.  The legs are without  rashes.  The distal pulses are intact.  Cranial nerves II - XII are intact.  Motor and sensory functions are intact.  The gait is normal.  ECG:  Assessment / Plan:

## 2011-04-27 NOTE — Assessment & Plan Note (Signed)
He is doing very well.  No angina.  Continue current meds.

## 2011-04-27 NOTE — Assessment & Plan Note (Signed)
He's really working hard on diet and exercise program.  To continue to try diet for starting fenofibrate.

## 2011-04-28 ENCOUNTER — Telehealth: Payer: Self-pay | Admitting: *Deleted

## 2011-04-28 NOTE — Telephone Encounter (Signed)
msg left to diet and exercise and continue meds, to call office with questions or concersJodette Mason Jim RN

## 2011-04-28 NOTE — Telephone Encounter (Signed)
Message copied by Antony Odea on Fri Apr 28, 2011  4:43 PM ------      Message from: Vesta Mixer      Created: Fri Apr 28, 2011  4:04 PM       Continue meds, diet and exercise

## 2011-08-09 ENCOUNTER — Encounter: Payer: Self-pay | Admitting: Surgery

## 2011-08-25 LAB — CBC
HCT: 39.5 % (ref 39.0–52.0)
HCT: 41.4 % (ref 39.0–52.0)
HCT: 44.7 % (ref 39.0–52.0)
Hemoglobin: 13.6 g/dL (ref 13.0–17.0)
Hemoglobin: 14.2 g/dL (ref 13.0–17.0)
MCHC: 33.5 g/dL (ref 30.0–36.0)
MCHC: 34.3 g/dL (ref 30.0–36.0)
MCHC: 34.3 g/dL (ref 30.0–36.0)
Platelets: 255 10*3/uL (ref 150–400)
Platelets: 266 10*3/uL (ref 150–400)
RBC: 4.2 MIL/uL — ABNORMAL LOW (ref 4.22–5.81)
RDW: 13.6 % (ref 11.5–15.5)
RDW: 14 % (ref 11.5–15.5)
RDW: 14.2 % (ref 11.5–15.5)

## 2011-08-25 LAB — BASIC METABOLIC PANEL
BUN: 9 mg/dL (ref 6–23)
CO2: 23 mEq/L (ref 19–32)
CO2: 23 mEq/L (ref 19–32)
Calcium: 8.4 mg/dL (ref 8.4–10.5)
Calcium: 9.3 mg/dL (ref 8.4–10.5)
Chloride: 105 mEq/L (ref 96–112)
GFR calc Af Amer: >60 mL/min (ref >60)
Glucose, Bld: 100 mg/dL — ABNORMAL HIGH (ref 70–99)
Glucose, Bld: 93 mg/dL (ref 70–99)
Potassium: 3.7 mEq/L (ref 3.5–5.1)
Potassium: 4.1 mEq/L (ref 3.5–5.1)
Sodium: 136 mEq/L (ref 135–145)
Sodium: 141 mEq/L (ref 135–145)

## 2011-08-25 LAB — POCT I-STAT, CHEM 8
BUN: 13 mg/dL (ref 6–23)
BUN: 17 mg/dL (ref 6–23)
Calcium, Ion: 1.07 mmol/L — ABNORMAL LOW (ref 1.12–1.32)
Chloride: 102 mEq/L (ref 96–112)
Creatinine, Ser: 0.8 mg/dL (ref 0.4–1.5)
Creatinine, Ser: 1.1 mg/dL (ref 0.4–1.5)
Glucose, Bld: 108 mg/dL — ABNORMAL HIGH (ref 70–99)
Glucose, Bld: 110 mg/dL — ABNORMAL HIGH (ref 70–99)
Hemoglobin: 16.3 g/dL (ref 13.0–17.0)
Potassium: 3.2 mEq/L — ABNORMAL LOW (ref 3.5–5.1)
Sodium: 135 mEq/L (ref 135–145)
Sodium: 137 mEq/L (ref 135–145)
TCO2: 23 mmol/L (ref 0–100)

## 2011-08-25 LAB — POCT CARDIAC MARKERS: Myoglobin, poc: 72.3 ng/mL (ref 12–200)

## 2011-08-25 LAB — LIPID PANEL
Cholesterol: 402 mg/dL — ABNORMAL HIGH (ref 0–200)
LDL Cholesterol: 285 mg/dL — ABNORMAL HIGH (ref 0–99)
Total CHOL/HDL Ratio: 9.8 RATIO
Triglycerides: 380 mg/dL — ABNORMAL HIGH (ref <150)
VLDL: 76 mg/dL — ABNORMAL HIGH (ref 0–40)

## 2011-08-25 LAB — PROTIME-INR: Prothrombin Time: 11.9 seconds (ref 11.6–15.2)

## 2011-08-25 LAB — DIFFERENTIAL
Basophils Absolute: 0.1 10*3/uL (ref 0.0–0.1)
Basophils Relative: 0 % (ref 0–1)
Eosinophils Relative: 2 % (ref 0–5)
Lymphocytes Relative: 26 % (ref 12–46)
Neutro Abs: 11.6 10*3/uL — ABNORMAL HIGH (ref 1.7–7.7)

## 2011-08-25 LAB — CARDIAC PANEL(CRET KIN+CKTOT+MB+TROPI): Total CK: 1630 U/L — ABNORMAL HIGH (ref 7–232)

## 2011-09-29 ENCOUNTER — Encounter: Payer: Self-pay | Admitting: Surgery

## 2011-10-02 ENCOUNTER — Other Ambulatory Visit (INDEPENDENT_AMBULATORY_CARE_PROVIDER_SITE_OTHER): Payer: BC Managed Care – PPO | Admitting: *Deleted

## 2011-10-02 ENCOUNTER — Encounter: Payer: Self-pay | Admitting: Surgery

## 2011-10-02 ENCOUNTER — Ambulatory Visit (INDEPENDENT_AMBULATORY_CARE_PROVIDER_SITE_OTHER): Payer: BC Managed Care – PPO | Admitting: Surgery

## 2011-10-02 ENCOUNTER — Encounter (INDEPENDENT_AMBULATORY_CARE_PROVIDER_SITE_OTHER): Payer: BC Managed Care – PPO | Admitting: *Deleted

## 2011-10-02 VITALS — BP 150/84 | HR 69 | Ht 67.0 in | Wt 182.0 lb

## 2011-10-02 DIAGNOSIS — Z48812 Encounter for surgical aftercare following surgery on the circulatory system: Secondary | ICD-10-CM

## 2011-10-02 DIAGNOSIS — I70229 Atherosclerosis of native arteries of extremities with rest pain, unspecified extremity: Secondary | ICD-10-CM

## 2011-10-02 DIAGNOSIS — I70219 Atherosclerosis of native arteries of extremities with intermittent claudication, unspecified extremity: Secondary | ICD-10-CM

## 2011-10-02 NOTE — Progress Notes (Signed)
Vascular and Vein Specialist of [facility]   Patient name: Donald Mckinney MRN: [id number] DOB: 1946/03/16 Sex: male     Chief Complaint  Patient presents with  . PVD    1 yr. f/u; vasc study today    HISTORY OF PRESENT ILLNESS: The patient comes back today for followup. He suffered a myocardial infarction in in December of 2009. He underwent a cardiac catheterization for this and in the recovery room was found to have significant rest pain in his right leg. He ultimately underwent stenting of his right external and bilateral common iliac arteries. He is also been taking cilostazol. He denies symptoms of claudication at this time. He denies ulceration he denies nonhealing wounds. Overall he is doing very well. He continues to be without chest pain.  Past Medical History  Diagnosis Date  . Coronary artery disease   . MI, old     INFERIOR WALL M.I.  . Hyperlipidemia   . Hypertension   . AAA (abdominal aortic aneurysm)   . PVD (peripheral vascular disease)     MODERATE TO SEVERE, INVOLVING THE DISTAL AORTA AND BILATERAL ILIACS  . Myocardial infarction 10/24/2008  . Leg pain     with walking    Past Surgical History  Procedure Date  . Cardiac catheterization     WELL PRESERVED LEFT VENTRICULAR SYSTOLIC FUNCTION. EF 55%  . Coronary angioplasty with stent placement     RIGHT CORONARY ARTERY, LEFT ANTERIOR DESCENDING ARTERY  . Coronary artery bypass graft   . Iliac artery stenting     History   Social History  . Marital Status: Married    Spouse Name: N/A    Number of Children: N/A  . Years of Education: N/A   Occupational History  . Not on file.   Social History Main Topics  . Smoking status: Former Smoker    Types: Cigarettes    Quit date: 10/20/2008  . Smokeless tobacco: Not on file  . Alcohol Use: 0.0 oz/week     states drinking 2-3 beers a day  . Drug Use: No  . Sexually Active: Not on file   Other Topics Concern  . Not on file   Social History Narrative  .  No narrative on file    No family history on file.  Allergies as of 10/02/2011 - Review Complete 10/02/2011  Allergen Reaction Noted  . Ampicillin  04/26/2011  . Iodinated diagnostic agents  04/26/2011    Current Outpatient Prescriptions on File Prior to Visit  Medication Sig Dispense Refill  . aspirin 81 MG tablet Take 81 mg by mouth daily.        . carvedilol (COREG) 3.125 MG tablet Take 12.5 mg by mouth 2 (two) times daily with a meal.       . cilostazol (PLETAL) 100 MG tablet Take 100 mg by mouth 2 (two) times daily.        . clopidogrel (PLAVIX) 75 MG tablet Take 75 mg by mouth daily.        [name] lisinopril (PRINIVIL,ZESTRIL) 10 MG tablet Take 10 mg by mouth daily.        . Multiple Vitamin (MULTIVITAMIN) tablet Take 1 tablet by mouth daily.        . niacin (NIASPAN) 500 MG CR tablet Take 500 mg by mouth at bedtime.        [name] NITROSTAT 0.4 MG SL tablet DISSOLVE ONE TABLET UNDER THE TONGUE EVERY 5 MINUTES AS NEEDED FOR CHEST PAIN.  DO NOT EXCEED  A TOTAL OF 3 DOSES IN 15 MINUTES. NO RELIEF, C  25 each  3  . rosuvastatin (CRESTOR) 20 MG tablet Take 20 mg by mouth daily.           REVIEW OF SYSTEMS: No change from prior visit  PHYSICAL EXAMINATION:   Vital signs are BP 150/84  Pulse 69  Ht 5\' 7"  (1.702 m)  Wt 182 lb (82.555 kg)  BMI 28.51 kg/m2  SpO2 99% General: The patient appears their stated age. Pulmonary:  Non labored breathing Musculoskeletal: There are no major deformities.  There is no significant extremity pain. Neurologic: No focal weakness or paresthesias are detected, Skin: There are no ulcer or rashes noted. Psychiatric: The patient has normal affect. Cardiovascular: Pedal pulses are not palpable he has 2+ femoral pulses bilaterally   Diagnostic Studies Ultrasound was reviewed in order today this shows stable ABIs. 0.44 on the right 0.55 on the left. There were unable to visualize the distal abdominal aorta due to overlying bowel gas  Assessment: Status post  bilateral iliac stenting for severe claudication/rest pain Plan: The patient continues to do very well. He denies symptoms of claudication. I recommend continuing to take his cilostazol. We will repeat an ultrasound in one year. Should he develop recurrent symptoms he'll contact me.  Jorge Ny, M.D. [facility] of [place] Office: [phone] Pager:  [phone]

## 2011-10-09 NOTE — Procedures (Unsigned)
AORTA-ILIAC DUPLEX EVALUATION  INDICATION:  Iliac stents.  HISTORY: Diabetes:  No. Cardiac:  MI. Hypertension:  Yes. Smoking:  Previous. Previous Surgery:  Bilateral common iliac artery and right external iliac artery stents on 09/26/2009.              SINGLE LEVEL ARTERIAL EXAM                             RIGHT                  LEFT Brachial: Anterior tibial: Posterior tibial: Peroneal: Ankle/brachial index: Previous ABI/date:  AORTA-ILIAC DUPLEX EXAM Aorta - Proximal     26 cm/s Aorta - Mid          Not visualized Aorta - Distal       Not visualized  RIGHT                                   LEFT Not visualized    CIA-PROXIMAL          Not visualized Not visualized    CIA-DISTAL            Not visualized Not visualized    HYPOGASTRIC           Not visualized 49 cm/s           EIA-PROXIMAL          83 cm/s 82 cm/s           EIA-MID               177 cm/s 92 cm/s           EIA-DISTAL            183 cm/s  IMPRESSION: 1. Unable to adequately visualize the distal abdominal aorta and     bilateral common iliac arteries due to overlying bowel gas     patterns. 2. Patent bilateral external iliac arteries with monophasic Doppler     waveforms noted. 3. Bilateral ankle brachial indices are noted on a separate report.  ___________________________________________ V. Charlena Cross, MD  CH/MEDQ  D:  10/04/2011  T:  10/04/2011  Job:  [id number]

## 2011-10-24 ENCOUNTER — Ambulatory Visit (INDEPENDENT_AMBULATORY_CARE_PROVIDER_SITE_OTHER): Payer: BC Managed Care – PPO | Admitting: Cardiovascular Disease

## 2011-10-24 ENCOUNTER — Encounter: Payer: Self-pay | Admitting: Cardiovascular Disease

## 2011-10-24 DIAGNOSIS — I2510 Atherosclerotic heart disease of native coronary artery without angina pectoris: Secondary | ICD-10-CM

## 2011-10-24 DIAGNOSIS — E785 Hyperlipidemia, unspecified: Secondary | ICD-10-CM

## 2011-10-24 DIAGNOSIS — I10 Essential (primary) hypertension: Secondary | ICD-10-CM

## 2011-10-24 DIAGNOSIS — I739 Peripheral vascular disease, unspecified: Secondary | ICD-10-CM | POA: Insufficient documentation

## 2011-10-24 LAB — LIPID PANEL
Cholesterol: 261 mg/dL — ABNORMAL HIGH (ref 0–200)
Total CHOL/HDL Ratio: 4
Triglycerides: 254 mg/dL — ABNORMAL HIGH (ref 0.0–149.0)
VLDL: 50.8 mg/dL — ABNORMAL HIGH (ref 0.0–40.0)

## 2011-10-24 LAB — HEPATIC FUNCTION PANEL
Albumin: 4.5 g/dL (ref 3.5–5.2)
Alkaline Phosphatase: 45 U/L (ref 39–117)

## 2011-10-24 LAB — BASIC METABOLIC PANEL
BUN: 12 mg/dL (ref 6–23)
Chloride: 105 mEq/L (ref 96–112)
Potassium: 4.7 mEq/L (ref 3.5–5.1)

## 2011-10-24 NOTE — Assessment & Plan Note (Signed)
He continues to followup with Dr. Myra Gianotti.   He's not having any claudication.

## 2011-10-24 NOTE — Assessment & Plan Note (Signed)
Horrace remained fairly stable. He's not had any episodes of chest pain or shortness breath.

## 2011-10-24 NOTE — Progress Notes (Signed)
Donald Mckinney Date of Birth  1946/04/28 [facility] [address]    [address] [place], [place]  [address] [phone]  Fax  [phone]  History of Present Illness:  Donald Mckinney is a 65 year old gentleman with a history of coronary artery disease. He status post PTCA and stenting of his right coronary artery and also status post PTCA and stenting of left anterior descending artery.  He also has a history of hypertension and hyperlipidemia.  He has a history of severe peripheral vascular disease and is followed by Dr. Myra Gianotti.  He's done well since I last saw him. He's not having episodes of chest pain or shortness of breath. He injured his right knee and has not been exercising quite as much as he would like.  Current Outpatient Prescriptions on File Prior to Visit  Medication Sig Dispense Refill  . aspirin 81 MG tablet Take 81 mg by mouth daily.        . carvedilol (COREG) 3.125 MG tablet Take 12.5 mg by mouth 2 (two) times daily with a meal.       . cilostazol (PLETAL) 100 MG tablet Take 100 mg by mouth 2 (two) times daily.        . clopidogrel (PLAVIX) 75 MG tablet Take 75 mg by mouth daily.        [name] lisinopril (PRINIVIL,ZESTRIL) 10 MG tablet Take 10 mg by mouth daily.        . Multiple Vitamin (MULTIVITAMIN) tablet Take 1 tablet by mouth daily.        . niacin (NIASPAN) 500 MG CR tablet Take 500 mg by mouth at bedtime.        [name] NITROSTAT 0.4 MG SL tablet DISSOLVE ONE TABLET UNDER THE TONGUE EVERY 5 MINUTES AS NEEDED FOR CHEST PAIN.  DO NOT EXCEED A TOTAL OF 3 DOSES IN 15 MINUTES. NO RELIEF, C  25 each  3  . rosuvastatin (CRESTOR) 20 MG tablet Take 20 mg by mouth daily.          Allergies  Allergen Reactions  . Ampicillin   . Iodinated Diagnostic Agents     Past Medical History  Diagnosis Date  . Coronary artery disease   . MI, old     INFERIOR WALL M.I.  . Hyperlipidemia   . Hypertension   . AAA (abdominal aortic aneurysm)   . PVD (peripheral vascular disease)       MODERATE TO SEVERE, INVOLVING THE DISTAL AORTA AND BILATERAL ILIACS  . Myocardial infarction 10/24/2008  . Leg pain     with walking    Past Surgical History  Procedure Date  . Cardiac catheterization     WELL PRESERVED LEFT VENTRICULAR SYSTOLIC FUNCTION. EF 55%  . Coronary angioplasty with stent placement     RIGHT CORONARY ARTERY, LEFT ANTERIOR DESCENDING ARTERY  . Coronary artery bypass graft   . Iliac artery stenting     History  Smoking status  . Former Smoker  . Types: Cigarettes  . Quit date: 10/20/2008  Smokeless tobacco  . Not on file    History  Alcohol Use  . 0.0 oz/week    states drinking 2-3 beers a day    No family history on file.  Reviw of Systems:  Reviewed in the HPI.  All other systems are negative.  Physical Exam: BP 160/91  Pulse 68  Ht 5\' 7"  (1.702 m)  Wt 183 lb (83.008 kg)  BMI 28.66 kg/m2 The patient is alert and oriented  x 3.  The mood and affect are normal.   Skin: warm and dry.  Color is normal.    HEENT:  [place]/AT , normal carotids, no JVD  Lungs: Clear   Heart: RR, no murmurs    Abdomen: +BS, nontender  Extremities:  Poor leg pulses  Neuro:  nonfocal    ECG: NSR. Mild non specific ST /T wave abnormalities.  Assessment / Plan:

## 2011-10-24 NOTE — Patient Instructions (Addendum)
Your physician wants you to follow-up in: 6 months You will receive a reminder letter in the mail two months in advance. If you don't receive a letter, please call our office to schedule the follow-up appointment.  Your physician recommends that you return for a FASTING lipid profile: 6 months and TODAY, we will call you with results in 3-5 days.

## 2011-10-25 MED ORDER — FENOFIBRATE 145 MG PO TABS: 145.0000 mg | ORAL_TABLET | Freq: Every day | ORAL | Status: DC

## 2011-10-25 NOTE — Progress Notes (Signed)
New addition of tricor added, pt informed and 3 mo labs ordered, pt aware

## 2011-12-12 ENCOUNTER — Other Ambulatory Visit: Payer: Self-pay | Admitting: Cardiology

## 2011-12-12 ENCOUNTER — Telehealth: Payer: Self-pay | Admitting: Cardiovascular Disease

## 2011-12-12 ENCOUNTER — Other Ambulatory Visit: Payer: Self-pay | Admitting: Cardiovascular Disease

## 2011-12-12 ENCOUNTER — Other Ambulatory Visit: Payer: Self-pay | Admitting: Surgery

## 2011-12-12 NOTE — Telephone Encounter (Signed)
Fu call °Patient returning your call °

## 2011-12-12 NOTE — Telephone Encounter (Signed)
They are not sure why our office number showed up on there phone, no notes seen for calls.

## 2011-12-12 NOTE — Telephone Encounter (Signed)
Fax Received. Refill Completed. Donald Mckinney (R.M.A)   

## 2011-12-18 ENCOUNTER — Other Ambulatory Visit: Payer: Self-pay | Admitting: Cardiology

## 2012-01-17 ENCOUNTER — Other Ambulatory Visit: Payer: Self-pay | Admitting: Cardiovascular Disease

## 2012-01-18 NOTE — Telephone Encounter (Signed)
Refilled carvedilol.

## 2012-01-23 ENCOUNTER — Other Ambulatory Visit (INDEPENDENT_AMBULATORY_CARE_PROVIDER_SITE_OTHER): Payer: BC Managed Care – PPO

## 2012-01-23 DIAGNOSIS — I739 Peripheral vascular disease, unspecified: Secondary | ICD-10-CM

## 2012-01-23 DIAGNOSIS — I2510 Atherosclerotic heart disease of native coronary artery without angina pectoris: Secondary | ICD-10-CM

## 2012-01-23 DIAGNOSIS — E785 Hyperlipidemia, unspecified: Secondary | ICD-10-CM

## 2012-01-23 LAB — LIPID PANEL
Cholesterol: 161 mg/dL (ref 0–200)
LDL Cholesterol: 60 mg/dL (ref 0–99)
Total CHOL/HDL Ratio: 3

## 2012-01-23 LAB — BASIC METABOLIC PANEL
Chloride: 101 mEq/L (ref 96–112)
Potassium: 3.6 mEq/L (ref 3.5–5.1)
Sodium: 134 mEq/L — ABNORMAL LOW (ref 135–145)

## 2012-01-23 LAB — HEPATIC FUNCTION PANEL
ALT: 25 U/L (ref 0–53)
AST: 24 U/L (ref 0–37)
Bilirubin, Direct: 0 mg/dL (ref 0.0–0.3)
Total Bilirubin: 0.4 mg/dL (ref 0.3–1.2)

## 2012-02-05 ENCOUNTER — Other Ambulatory Visit: Payer: Self-pay | Admitting: Cardiovascular Disease

## 2012-05-09 ENCOUNTER — Encounter: Payer: Self-pay | Admitting: Cardiovascular Disease

## 2012-05-09 ENCOUNTER — Ambulatory Visit (INDEPENDENT_AMBULATORY_CARE_PROVIDER_SITE_OTHER): Payer: BC Managed Care – PPO | Admitting: Cardiovascular Disease

## 2012-05-09 VITALS — BP 140/86 | HR 68 | Ht 67.0 in | Wt 184.8 lb

## 2012-05-09 DIAGNOSIS — I739 Peripheral vascular disease, unspecified: Secondary | ICD-10-CM

## 2012-05-09 DIAGNOSIS — I2510 Atherosclerotic heart disease of native coronary artery without angina pectoris: Secondary | ICD-10-CM

## 2012-05-09 NOTE — Assessment & Plan Note (Signed)
Cedrone doing well. He's not had any episodes of chest pain his lipid levels look okay. His triglyceride level has decreased dramatically.  I'll see him again in 6 months. We'll check fasting labs at that time.

## 2012-05-09 NOTE — Patient Instructions (Addendum)
Your physician wants you to follow-up in: 6 MONTHS.  You will receive a reminder letter in the mail two months in advance. If you don't receive a letter, please call our office to schedule the follow-up appointment.  Your physician recommends that you continue on your current medications as directed. Please refer to the Current Medication list given to you today.  

## 2012-05-09 NOTE — Progress Notes (Signed)
[name] Date of Birth  [birth year]       Donald Mckinney Office [address]  [address] [place], [place]  [address]   [place], [place]  [address] [phone]     [phone]   Fax  [phone]    Fax [phone]  Problem List: 1. Coronary artery disease-status post anterior wall myocardial infarction, status post PTCA and stenting of his right coronary artery and later status post PTCA and stenting of his LAD 2. Hyperlipidemia 3. Hypertension 4. History of peripheral vascular disease-followed by Dr. York Pellant  History of Present Illness: Donald Mckinney is a 66 year old gentleman with a history of coronary artery disease. He status post PTCA and stenting of his right coronary artery and also status post PTCA and stenting of left anterior descending artery. He also has a history of hypertension and hyperlipidemia. He has a history of severe peripheral vascular disease and is followed by Dr. Myra Gianotti.  His blood pressure is typically normal at home. He states that he goes up when he comes to the Dr.  He has been exercising without cp or claudication.   Current Outpatient Prescriptions on File Prior to Visit  Medication Sig Dispense Refill  . aspirin 81 MG tablet Take 81 mg by mouth daily.        . carvedilol (COREG) 12.5 MG tablet TAKE ONE TABLET BY MOUTH TWICE DAILY  60 tablet  11  . cilostazol (PLETAL) 100 MG tablet TAKE ONE TABLET BY MOUTH TWICE DAILY  60 tablet  11  . CRESTOR 20 MG tablet TAKE ONE TABLET BY MOUTH EVERY DAY AT BEDTIME  30 each  5  . fenofibrate (TRICOR) 145 MG tablet Take 1 tablet (145 mg total) by mouth daily.  30 tablet  5  . lisinopril (PRINIVIL,ZESTRIL) 10 MG tablet TAKE ONE TABLET BY MOUTH EVERY DAY  30 tablet  7  . Multiple Vitamin (MULTIVITAMIN) tablet Take 1 tablet by mouth daily.        [name] NIASPAN 500 MG CR tablet TAKE ONE TABLET BY MOUTH EVERY DAY AT BEDTIME  30 each  7  . NITROSTAT 0.4 MG SL tablet DISSOLVE  ONE TABLET UNDER THE TONGUE EVERY 5 MINUTES AS NEEDED FOR CHEST PAIN.  DO NOT EXCEED A TOTAL OF 3 DOSES IN 15 MINUTES. NO RELIEF, C  25 each  3  . PLAVIX 75 MG tablet TAKE ONE TABLET BY MOUTH EVERY DAY  30 each  6  . DISCONTD: carvedilol (COREG) 3.125 MG tablet Take 12.5 mg by mouth 2 (two) times daily with a meal.         Allergies  Allergen Reactions  . Ampicillin   . Iodinated Diagnostic Agents     Past Medical History  Diagnosis Date  . Coronary artery disease   . MI, old     INFERIOR WALL M.I.  . Hyperlipidemia   . Hypertension   . AAA (abdominal aortic aneurysm)   . PVD (peripheral vascular disease)     MODERATE TO SEVERE, INVOLVING THE DISTAL AORTA AND BILATERAL ILIACS  . Myocardial infarction 10/24/2008  . Leg pain     with walking    Past Surgical History  Procedure Date  . Cardiac catheterization     WELL PRESERVED LEFT VENTRICULAR SYSTOLIC FUNCTION. EF 55%  . Coronary angioplasty with stent placement     RIGHT CORONARY ARTERY, LEFT ANTERIOR DESCENDING ARTERY  . Coronary artery bypass graft   .  Iliac artery stenting     History  Smoking status  . Former Smoker  . Types: Cigarettes  . Quit date: 10/20/2008  Smokeless tobacco  . Not on file    History  Alcohol Use  . 0.0 oz/week    states drinking 2-3 beers a day    No family history on file.  Reviw of Systems:  Reviewed in the HPI.  All other systems are negative.  Physical Exam: Blood pressure 140/86, pulse 68, height 5\' 7"  (1.702 m), weight 184 lb 12.8 oz (83.825 kg). General: Well developed, well nourished, in no acute distress.  Head: Normocephalic, atraumatic, sclera non-icteric, mucus membranes are moist,   Neck: Supple. Carotids are 2 + without bruits. No JVD  Lungs: Clear bilaterally to auscultation.  Heart: regular rate.  normal  S1 S2. No murmurs, gallops or rubs.  Abdomen: Soft, non-tender, non-distended with normal bowel sounds. No hepatomegaly. No rebound/guarding. No  masses.  Msk:  Strength and tone are normal  Extremities: No clubbing or cyanosis. No edema.  Distal pedal pulses are 2+ and equal bilaterally.  Neuro: Alert and oriented X 3. Moves all extremities spontaneously.  Psych:  Responds to questions appropriately with a normal affect.  ECG:  Assessment / Plan:

## 2012-05-09 NOTE — Assessment & Plan Note (Signed)
He's not had any episodes of claudication recently. He's walking on a regular basis. We'll continue the same medications. I'll see him again in 6 months for followup visit and fasting lipid profile, basic metabolic profile,  liver enzymes and office visit.

## 2012-06-13 ENCOUNTER — Other Ambulatory Visit: Payer: Self-pay | Admitting: Cardiovascular Disease

## 2012-06-13 NOTE — Telephone Encounter (Signed)
..   Requested Prescriptions   Pending Prescriptions Disp Refills  . TRICOR 145 MG tablet [Pharmacy Med Name: TRICOR 145MG         TAB] 30 each 6    Sig: TAKE ONE TABLET BY MOUTH EVERY DAY

## 2012-07-15 ENCOUNTER — Other Ambulatory Visit: Payer: Self-pay | Admitting: Cardiovascular Disease

## 2012-10-04 ENCOUNTER — Encounter: Payer: Self-pay | Admitting: Surgery

## 2012-10-04 ENCOUNTER — Other Ambulatory Visit: Payer: Self-pay | Admitting: *Deleted

## 2012-10-04 DIAGNOSIS — I739 Peripheral vascular disease, unspecified: Secondary | ICD-10-CM

## 2012-10-04 DIAGNOSIS — Z48812 Encounter for surgical aftercare following surgery on the circulatory system: Secondary | ICD-10-CM

## 2012-10-07 ENCOUNTER — Ambulatory Visit (INDEPENDENT_AMBULATORY_CARE_PROVIDER_SITE_OTHER): Payer: BC Managed Care – PPO | Admitting: Vascular Surgery

## 2012-10-07 ENCOUNTER — Ambulatory Visit (INDEPENDENT_AMBULATORY_CARE_PROVIDER_SITE_OTHER): Payer: BC Managed Care – PPO | Admitting: Surgery

## 2012-10-07 ENCOUNTER — Encounter: Payer: Self-pay | Admitting: Surgery

## 2012-10-07 VITALS — BP 151/81 | HR 67 | Resp 16 | Ht 67.0 in | Wt 180.0 lb

## 2012-10-07 DIAGNOSIS — M79609 Pain in unspecified limb: Secondary | ICD-10-CM

## 2012-10-07 DIAGNOSIS — Z48812 Encounter for surgical aftercare following surgery on the circulatory system: Secondary | ICD-10-CM

## 2012-10-07 DIAGNOSIS — I714 Abdominal aortic aneurysm, without rupture, unspecified: Secondary | ICD-10-CM

## 2012-10-07 DIAGNOSIS — I739 Peripheral vascular disease, unspecified: Secondary | ICD-10-CM

## 2012-10-07 NOTE — Addendum Note (Signed)
Addended by: Sharee Pimple on: 10/07/2012 02:29 PM   Modules accepted: Orders

## 2012-10-07 NOTE — Progress Notes (Signed)
Vascular and Vein Specialist of [facility]   Patient name: Donald Mckinney MRN: [id number] DOB: Dec 03, 1945 Sex: male     Chief Complaint  Patient presents with  . PVD    One year f/up with vascular Lab study.  C/O  Bilateral pain in legs with walking off/on.    HISTORY OF PRESENT ILLNESS: The patient is here today for followup. He initially suffered a MI in early 2010. This was treated percutaneously. He was found to have poor lower extremity circulation at that time. He has subsequently undergone stenting of his bilateral common iliac arteries as well as his right external iliac artery. He is also taking cilostazol. He only takes this in the morning. He rarely gets symptoms of claudication. He denies ulceration. He denies rest pain.  Past Medical History  Diagnosis Date  . Coronary artery disease   . MI, old     INFERIOR WALL M.I.  . Hyperlipidemia   . Hypertension   . AAA (abdominal aortic aneurysm)   . PVD (peripheral vascular disease)     MODERATE TO SEVERE, INVOLVING THE DISTAL AORTA AND BILATERAL ILIACS  . Myocardial infarction 10/24/2008  . Leg pain     with walking  . Cancer 2005    [facility]    Past Surgical History  Procedure Date  . Cardiac catheterization     WELL PRESERVED LEFT VENTRICULAR SYSTOLIC FUNCTION. EF 55%  . Coronary angioplasty with stent placement     RIGHT CORONARY ARTERY, LEFT ANTERIOR DESCENDING ARTERY  . Coronary artery bypass graft   . Iliac artery stenting     History   Social History  . Marital Status: Married    Spouse Name: N/A    Number of Children: N/A  . Years of Education: N/A   Occupational History  . Not on file.   Social History Main Topics  . Smoking status: Former Smoker    Types: Cigarettes    Quit date: 10/20/2008  . Smokeless tobacco: Not on file  . Alcohol Use: 0.0 oz/week     Comment: states drinking 2-3 beers a day  . Drug Use: No  . Sexually Active: Not on file   Other Topics Concern  . Not on file   Social  History Narrative  . No narrative on file    Family History  Problem Relation Age of Onset  . Cancer Mother   . Hyperlipidemia Mother   . Cancer Father     Allergies as of 10/07/2012 - Review Complete 10/07/2012  Allergen Reaction Noted  . Ampicillin  04/26/2011  . Iodinated diagnostic agents  04/26/2011    Current Outpatient Prescriptions on File Prior to Visit  Medication Sig Dispense Refill  . aspirin 81 MG tablet Take 81 mg by mouth daily.        . carvedilol (COREG) 12.5 MG tablet TAKE ONE TABLET BY MOUTH TWICE DAILY  60 tablet  11  . cilostazol (PLETAL) 100 MG tablet TAKE ONE TABLET BY MOUTH TWICE DAILY  60 tablet  11  . clopidogrel (PLAVIX) 75 MG tablet TAKE ONE TABLET BY MOUTH EVERY DAY  30 tablet  9  . CRESTOR 20 MG tablet TAKE ONE TABLET BY MOUTH EVERY DAY AT BEDTIME  30 each  9  . lisinopril (PRINIVIL,ZESTRIL) 10 MG tablet TAKE ONE TABLET BY MOUTH EVERY DAY  30 tablet  7  . Multiple Vitamin (MULTIVITAMIN) tablet Take 1 tablet by mouth daily.        [name] NIASPAN 500 MG CR  tablet TAKE ONE TABLET BY MOUTH EVERY DAY AT BEDTIME  30 each  7  . NITROSTAT 0.4 MG SL tablet DISSOLVE ONE TABLET UNDER THE TONGUE EVERY 5 MINUTES AS NEEDED FOR CHEST PAIN.  DO NOT EXCEED A TOTAL OF 3 DOSES IN 15 MINUTES. NO RELIEF, C  25 each  3  . TRICOR 145 MG tablet TAKE ONE TABLET BY MOUTH EVERY DAY  30 each  6     REVIEW OF SYSTEMS: Past for pain in his legs with walking, off and on. All other systems are negative as documented by the patient in the encounter form.  PHYSICAL EXAMINATION:   Vital signs are BP 151/81  Pulse 67  Resp 16  Ht 5\' 7"  (1.702 m)  Wt 180 lb (81.647 kg)  BMI 28.19 kg/m2  SpO2 99% General: The patient appears their stated age. HEENT:  No gross abnormalities Pulmonary:  Non labored breathing Abdomen: Soft and non-tender Musculoskeletal: There are no major deformities. Neurologic: No focal weakness or paresthesias are detected, Skin: There are no ulcer or rashes  noted. Psychiatric: The patient has normal affect. Cardiovascular: There is a regular rate and rhythm without significant murmur appreciated. No carotid bruits. Pedal pulses are not palpable.  Diagnostic Studies Ultrasound was ordered and reviewed. An ABI on the left is 0.65, which is an increase from 0.55 one year ago. ABI on the right is 0.41, which is a decrease from 0.44 one year ago. No evidence of in-stent stenosis was detected today.  Assessment:  peripheral vascular disease status post bilateral iliac stenting Plan: The patient continues to be with minimal symptoms. For that reason, I have recommended continuing our current treatment plan which is surveillance of his iliac stents, and cilostazol. I have encouraged him to contact me if he develops worsening symptoms. Otherwise I will see him back in one year with a repeat ultrasound.  Jorge Ny, M.D. [facility] [place] Office: [phone] Pager:  [phone]

## 2012-10-07 NOTE — Progress Notes (Signed)
Ankle brachial index performed @ [facility] 10/07/2012

## 2012-10-07 NOTE — Progress Notes (Signed)
Aorto-iliac artery duplex performed @ [facility] 10/07/2012

## 2012-11-18 ENCOUNTER — Other Ambulatory Visit: Payer: Self-pay | Admitting: *Deleted

## 2012-11-18 MED ORDER — LISINOPRIL 10 MG PO TABS: 10.0000 mg | ORAL_TABLET | Freq: Every day | ORAL | Status: DC

## 2012-11-18 NOTE — Telephone Encounter (Signed)
Fax Received. Refill Completed. Donald Mckinney (R.M.A)   

## 2012-12-10 ENCOUNTER — Telehealth: Payer: Self-pay | Admitting: Cardiovascular Disease

## 2012-12-10 NOTE — Telephone Encounter (Signed)
error 

## 2012-12-14 ENCOUNTER — Other Ambulatory Visit: Payer: Self-pay | Admitting: Surgery

## 2013-01-06 ENCOUNTER — Encounter: Payer: Self-pay | Admitting: Cardiovascular Disease

## 2013-01-06 ENCOUNTER — Ambulatory Visit (INDEPENDENT_AMBULATORY_CARE_PROVIDER_SITE_OTHER): Payer: BC Managed Care – PPO | Admitting: Cardiovascular Disease

## 2013-01-06 VITALS — BP 150/83 | HR 66 | Ht 67.0 in | Wt 187.0 lb

## 2013-01-06 DIAGNOSIS — I10 Essential (primary) hypertension: Secondary | ICD-10-CM

## 2013-01-06 DIAGNOSIS — E785 Hyperlipidemia, unspecified: Secondary | ICD-10-CM

## 2013-01-06 DIAGNOSIS — I2510 Atherosclerotic heart disease of native coronary artery without angina pectoris: Secondary | ICD-10-CM

## 2013-01-06 NOTE — Assessment & Plan Note (Signed)
He has been keeping his blood pressure records and his readings have been normal.

## 2013-01-06 NOTE — Assessment & Plan Note (Signed)
Donald Mckinney  is doing well. He status post myocardial infarction in 2009. He has not had any episodes of chest pain or shortness of breath. We will continue with his same medications. We'll check fasting labs on him today. His EKG is unremarkable. I've encouraged him to exercise on regular basis

## 2013-01-06 NOTE — Progress Notes (Signed)
[name] Date of Birth  1946-03-04       [facility] Office [address]  [address] [place], [place]  [address]   Donald [place]ckin[place], [place]  [address] [phone]     [phone]   Fax  [phone]    Fax [phone]  Problem List: 1. Coronary artery disease-status post anterior wall myocardial infarction (Dec. 5, 2009), status post PTCA and stenting of his right coronary artery and later status post PTCA and stenting of his LAD 2. Hyperlipidemia 3. Hypertension 4. History of peripheral vascular disease-followed by Dr. York Pellant  History of Present Illness: Donald Mckinney is a 67 year old gentleman with a history of coronary artery disease. He status post PTCA and stenting of his right coronary artery and also status post PTCA and stenting of left anterior descending artery. He also has a history of hypertension and hyperlipidemia. He has a history of severe peripheral vascular disease and is followed by Dr. Myra Gianotti.  His blood pressure is typically normal at home. He states that he goes up when he comes to the Dr.  He has been exercising without cp or claudication.  Feb. 17, 2014:  Donald Mckinney is doing very well. He's not had any episodes of chest pain or shortness of breath. He's had some basal cell cancers removed from his face.  He has not been exercising as much as he would like.  He is fasting today.  He has been keeping a record of his BP - his readings are OK.   Current Outpatient Prescriptions on File Prior to Visit  Medication Sig Dispense Refill  . aspirin 81 MG tablet Take 81 mg by mouth daily.        . carvedilol (COREG) 12.5 MG tablet TAKE ONE TABLET BY MOUTH TWICE DAILY  60 tablet  11  . cilostazol (PLETAL) 100 MG tablet TAKE ONE TABLET BY MOUTH TWICE DAILY  60 tablet  10  . clopidogrel (PLAVIX) 75 MG tablet TAKE ONE TABLET BY MOUTH EVERY DAY  30 tablet  9  . CRESTOR 20 MG tablet TAKE ONE TABLET BY MOUTH EVERY DAY AT BEDTIME  30  each  9  . lisinopril (PRINIVIL,ZESTRIL) 10 MG tablet Take 1 tablet (10 mg total) by mouth daily.  30 tablet  5  . Multiple Vitamin (MULTIVITAMIN) tablet Take 1 tablet by mouth daily.        [name] NIASPAN 500 MG CR tablet TAKE ONE TABLET BY MOUTH EVERY DAY AT BEDTIME  30 each  7  . NITROSTAT 0.4 MG SL tablet DISSOLVE ONE TABLET UNDER THE TONGUE EVERY 5 MINUTES AS NEEDED FOR CHEST PAIN.  DO NOT EXCEED A TOTAL OF 3 DOSES IN 15 MINUTES. NO RELIEF, C  25 each  3  . TRICOR 145 MG tablet TAKE ONE TABLET BY MOUTH EVERY DAY  30 each  6   No current facility-administered medications on file prior to visit.    Allergies  Allergen Reactions  . Ampicillin   . Iodinated Diagnostic Agents     Past Medical History  Diagnosis Date  . Coronary artery disease   . MI, old     INFERIOR WALL M.I.  . Hyperlipidemia   . Hypertension   . AAA (abdominal aortic aneurysm)   . PVD (peripheral vascular disease)     MODERATE TO SEVERE, INVOLVING THE DISTAL AORTA AND BILATERAL ILIACS  . Myocardial infarction 10/24/2008  . Leg pain  with walking  . Cancer 2005    [facility]    Past Surgical History  Procedure Laterality Date  . Cardiac catheterization      WELL PRESERVED LEFT VENTRICULAR SYSTOLIC FUNCTION. EF 55%  . Coronary angioplasty with stent placement      RIGHT CORONARY ARTERY, LEFT ANTERIOR DESCENDING ARTERY  . Coronary artery bypass graft    . Iliac artery stenting      History  Smoking status  . Former Smoker  . Types: Cigarettes  . Quit date: 10/20/2008  Smokeless tobacco  . Not on file    History  Alcohol Use  . 0.0 oz/week    Comment: states drinking 2-3 beers a day    Family History  Problem Relation Age of Onset  . Cancer Mother   . Hyperlipidemia Mother   . Cancer Father     Reviw of Systems:  Reviewed in the HPI.  All other systems are negative.  Physical Exam: Blood pressure 150/83, pulse 66, height 5\' 7"  (1.702 m), weight 187 lb (84.823 kg). General: Well developed,  well nourished, in no acute distress.  Head: Normocephalic, atraumatic, sclera non-icteric, mucus membranes are moist,   Neck: Supple. Carotids are 2 + without bruits. No JVD  Lungs: Clear bilaterally to auscultation.  Heart: regular rate.  normal  S1 S2. No murmurs, gallops or rubs.  Abdomen: Soft, non-tender, non-distended with normal bowel sounds. No hepatomegaly. No rebound/guarding. No masses.  Msk:  Strength and tone are normal  Extremities: No clubbing or cyanosis. No edema.  Distal pedal pulses are 2+ and equal bilaterally.  Neuro: Alert and oriented X 3. Moves all extremities spontaneously.  Psych:  Responds to questions appropriately with a normal affect.  ECG: Feb. 17, 2014:  NSR at 67, NS ST abnormalities Assessment / Plan:

## 2013-01-06 NOTE — Patient Instructions (Addendum)
Your physician recommends that you return for a FASTING lipid profile: today and in 6 months   Your physician wants you to follow-up in: 6 months  You will receive a reminder letter in the mail two months in advance. If you don't receive a letter, please call our office to schedule the follow-up appointment.  Your physician recommends that you continue on your current medications as directed. Please refer to the Current Medication list given to you today.  

## 2013-01-17 ENCOUNTER — Other Ambulatory Visit: Payer: Self-pay | Admitting: *Deleted

## 2013-02-20 ENCOUNTER — Other Ambulatory Visit: Payer: Self-pay | Admitting: *Deleted

## 2013-02-20 MED ORDER — FENOFIBRATE 145 MG PO TABS: 145.0000 mg | ORAL_TABLET | Freq: Every day | ORAL | Status: DC

## 2013-02-20 NOTE — Telephone Encounter (Signed)
Fax Received. Refill Completed. Donald Mckinney (R.M.A)   

## 2013-02-28 ENCOUNTER — Telehealth: Payer: Self-pay | Admitting: Cardiovascular Disease

## 2013-02-28 NOTE — Telephone Encounter (Signed)
Pt needs results of labs from Monday 01/06/13, chart reviewed, pt's wife states pt had lab drawn 01/06/13, they were called later and told the blood needed to be redrawn due to blood freezing/ clotting. Lab called them and told pt to come in to redraw- pt said he couldn't come back then and was told to come when he could and no app was needed. Pt returned Friday 01/17/13 for lab redraw. Pt states all went smooth. Chart reviewed, no labs seen, I called [facility] for results and they have no record of labs. Incident reviewed with Doylene Bode Rn manager, I caledl pt / family back and gave her Gina's number and apologized, informed her she will be called back later after reviewed. Family states she wants to hear something by Tuesday 4/14.

## 2013-02-28 NOTE — Telephone Encounter (Signed)
New problem   Per pts wife it's been 6wks and they still have not heard anything about his lab results

## 2013-02-28 NOTE — Telephone Encounter (Signed)
Results were given prior, re gave results.

## 2013-04-23 ENCOUNTER — Ambulatory Visit (INDEPENDENT_AMBULATORY_CARE_PROVIDER_SITE_OTHER): Payer: Medicare Other | Admitting: *Deleted

## 2013-04-23 DIAGNOSIS — E785 Hyperlipidemia, unspecified: Secondary | ICD-10-CM

## 2013-04-23 LAB — LIPID PANEL
Cholesterol: 137 mg/dL (ref 0–200)
Total CHOL/HDL Ratio: 2
Triglycerides: 135 mg/dL (ref 0.0–149.0)

## 2013-04-23 LAB — HEPATIC FUNCTION PANEL
AST: 30 U/L (ref 0–37)
Albumin: 4.3 g/dL (ref 3.5–5.2)
Alkaline Phosphatase: 37 U/L — ABNORMAL LOW (ref 39–117)

## 2013-04-23 LAB — BASIC METABOLIC PANEL
CO2: 24 mEq/L (ref 19–32)
Calcium: 9.1 mg/dL (ref 8.4–10.5)
Creatinine, Ser: 0.9 mg/dL (ref 0.4–1.5)
GFR: 96.16 mL/min (ref >60.00)

## 2013-05-12 ENCOUNTER — Other Ambulatory Visit: Payer: Self-pay | Admitting: Cardiovascular Disease

## 2013-05-13 NOTE — Telephone Encounter (Signed)
Fax Received. Refill Completed. Donald Mckinney (R.M.A)   

## 2013-06-20 ENCOUNTER — Encounter: Payer: Self-pay | Admitting: Cardiovascular Disease

## 2013-06-25 ENCOUNTER — Other Ambulatory Visit: Payer: Self-pay

## 2013-07-09 ENCOUNTER — Other Ambulatory Visit: Payer: Self-pay | Admitting: *Deleted

## 2013-07-09 MED ORDER — ROSUVASTATIN CALCIUM 20 MG PO TABS: 20.0000 mg | ORAL_TABLET | Freq: Every day | ORAL | Status: DC

## 2013-07-09 MED ORDER — LISINOPRIL 10 MG PO TABS: 10.0000 mg | ORAL_TABLET | Freq: Every day | ORAL | Status: DC

## 2013-07-09 NOTE — Telephone Encounter (Signed)
Fax Received. Refill Completed. Donald Mckinney (R.M.A)   

## 2013-07-23 ENCOUNTER — Encounter: Payer: Self-pay | Admitting: Cardiovascular Disease

## 2013-07-24 ENCOUNTER — Ambulatory Visit: Payer: BC Managed Care – PPO | Admitting: Cardiovascular Disease

## 2013-08-20 ENCOUNTER — Ambulatory Visit: Payer: Self-pay | Admitting: Cardiovascular Disease

## 2013-08-21 ENCOUNTER — Other Ambulatory Visit: Payer: Self-pay | Admitting: Cardiovascular Disease

## 2013-09-24 ENCOUNTER — Other Ambulatory Visit: Payer: Self-pay | Admitting: Cardiovascular Disease

## 2013-09-25 ENCOUNTER — Other Ambulatory Visit: Payer: Self-pay

## 2013-09-29 ENCOUNTER — Encounter: Payer: Self-pay | Admitting: Cardiovascular Disease

## 2013-09-29 ENCOUNTER — Ambulatory Visit (INDEPENDENT_AMBULATORY_CARE_PROVIDER_SITE_OTHER): Payer: Medicare Other | Admitting: Cardiovascular Disease

## 2013-09-29 VITALS — BP 170/100 | HR 69 | Ht 67.0 in | Wt 187.8 lb

## 2013-09-29 DIAGNOSIS — I10 Essential (primary) hypertension: Secondary | ICD-10-CM

## 2013-09-29 DIAGNOSIS — E785 Hyperlipidemia, unspecified: Secondary | ICD-10-CM

## 2013-09-29 DIAGNOSIS — I2510 Atherosclerotic heart disease of native coronary artery without angina pectoris: Secondary | ICD-10-CM

## 2013-09-29 DIAGNOSIS — M79609 Pain in unspecified limb: Secondary | ICD-10-CM

## 2013-09-29 LAB — BASIC METABOLIC PANEL
CO2: 25 mEq/L (ref 19–32)
Chloride: 106 mEq/L (ref 96–112)
Glucose, Bld: 109 mg/dL — ABNORMAL HIGH (ref 70–99)
Potassium: 3.9 mEq/L (ref 3.5–5.1)
Sodium: 139 mEq/L (ref 135–145)

## 2013-09-29 LAB — LIPID PANEL
HDL: 59.7 mg/dL (ref >39.00)
LDL Cholesterol: 63 mg/dL (ref 0–99)
Total CHOL/HDL Ratio: 2
Triglycerides: 132 mg/dL (ref 0.0–149.0)
VLDL: 26.4 mg/dL (ref 0.0–40.0)

## 2013-09-29 LAB — HEPATIC FUNCTION PANEL
Albumin: 4.7 g/dL (ref 3.5–5.2)
Total Bilirubin: 0.8 mg/dL (ref 0.3–1.2)

## 2013-09-29 NOTE — Progress Notes (Signed)
[name] Date of Birth  1946/03/30       [facility]    [place] [address]  [address] [place], [place]  [address]   [place], [place]  [address] [phone]     [phone]   Fax  [phone]    Fax [phone]  Problem List: 1. Coronary artery disease-status post anterior wall myocardial infarction (Dec. 5, 2009), status post PTCA and stenting of his right coronary artery and later status post PTCA and stenting of his LAD 2. Hyperlipidemia 3. Hypertension 4. History of peripheral vascular disease-followed by Dr. York Pellant  History of Present Illness: Donald Mckinney is a 67 year old gentleman with a history of coronary artery disease. He status post PTCA and stenting of his right coronary artery and also status post PTCA and stenting of left anterior descending artery. He also has a history of hypertension and hyperlipidemia. He has a history of severe peripheral vascular disease and is followed by Dr. Myra Gianotti.  His blood pressure is typically normal at home. He states that he goes up when he comes to the Dr.  He has been exercising without cp or claudication.  Feb. 17, 2014:  [name] is doing very well. He's not had any episodes of chest pain or shortness of breath. He's had some basal cell cancers removed from his face.  He has not been exercising as much as he would like.  He is fasting today.  He has been keeping a record of his BP - his readings are OK.  Nov. 10, 2014:  [name] is doing well.  His BP is high today but his BP readings at home are ok.  He is playing lots of golf.    He is off the niaspan.  Current Outpatient Prescriptions on File Prior to Visit  Medication Sig Dispense Refill  . aspirin 81 MG tablet Take 81 mg by mouth daily.        . carvedilol (COREG) 12.5 MG tablet TAKE ONE TABLET BY MOUTH TWICE DAILY  60 tablet  11  . cilostazol (PLETAL) 100 MG tablet TAKE ONE TABLET BY MOUTH TWICE DAILY  60 tablet  10  .  clopidogrel (PLAVIX) 75 MG tablet TAKE ONE TABLET BY MOUTH EVERY DAY  30 tablet  6  . fenofibrate (TRICOR) 145 MG tablet TAKE ONE TABLET BY MOUTH ONCE DAILY  30 tablet  0  . lisinopril (PRINIVIL,ZESTRIL) 10 MG tablet Take 1 tablet (10 mg total) by mouth daily.  30 tablet  5  . Multiple Vitamin (MULTIVITAMIN) tablet Take 1 tablet by mouth daily.        [name] NITROSTAT 0.4 MG SL tablet DISSOLVE ONE TABLET UNDER THE TONGUE EVERY 5 MINUTES AS NEEDED FOR CHEST PAIN.  DO NOT EXCEED A TOTAL OF 3 DOSES IN 15 MINUTES. NO RELIEF, C  25 each  3  . rosuvastatin (CRESTOR) 20 MG tablet Take 1 tablet (20 mg total) by mouth daily.  30 tablet  5  . NIASPAN 500 MG CR tablet TAKE ONE TABLET BY MOUTH EVERY DAY AT BEDTIME  30 each  7   No current facility-administered medications on file prior to visit.    Allergies  Allergen Reactions  . Ampicillin   . Iodinated Diagnostic Agents     Past Medical History  Diagnosis Date  . Coronary artery disease   . MI, old     INFERIOR WALL M.I.  . Hyperlipidemia   . Hypertension   .  AAA (abdominal aortic aneurysm)   . PVD (peripheral vascular disease)     MODERATE TO SEVERE, INVOLVING THE DISTAL AORTA AND BILATERAL ILIACS  . Myocardial infarction 10/24/2008  . Leg pain     with walking  . Cancer 2005    [facility]    Past Surgical History  Procedure Laterality Date  . Cardiac catheterization      WELL PRESERVED LEFT VENTRICULAR SYSTOLIC FUNCTION. EF 55%  . Coronary angioplasty with stent placement      RIGHT CORONARY ARTERY, LEFT ANTERIOR DESCENDING ARTERY  . Coronary artery bypass graft    . Iliac artery stenting      History  Smoking status  . Former Smoker  . Types: Cigarettes  . Quit date: 10/20/2008  Smokeless tobacco  . Not on file    History  Alcohol Use  . 0.0 oz/week    Comment: states drinking 2-3 beers a day    Family History  Problem Relation Age of Onset  . Cancer Mother   . Hyperlipidemia Mother   . Cancer Father     Reviw of  Systems:  Reviewed in the HPI.  All other systems are negative.  Physical Exam: Blood pressure 170/100, pulse 69, height 5\' 7"  (1.702 m), weight 187 lb 12.8 oz (85.186 kg). General: Well developed, well nourished, in no acute distress.  Head: Normocephalic, atraumatic, sclera non-icteric, mucus membranes are moist,   Neck: Supple. Carotids are 2 + without bruits. No JVD  Lungs: Clear bilaterally to auscultation.  Heart: regular rate.  normal  S1 S2. No murmurs, gallops or rubs.  Abdomen: Soft, non-tender, non-distended with normal bowel sounds. No hepatomegaly. No rebound/guarding. No masses.  Msk:  Strength and tone are normal  Extremities: No clubbing or cyanosis. No edema.  Distal pedal pulses are 2+ and equal bilaterally.  Neuro: Alert and oriented X 3. Moves all extremities spontaneously.  Psych:  Responds to questions appropriately with a normal affect.  ECG: Nov. 10, 2014:   NSR, at 57. NS ST abnormality. Assessment / Plan:

## 2013-09-29 NOTE — Assessment & Plan Note (Signed)
Continue with current medications

## 2013-09-29 NOTE — Assessment & Plan Note (Signed)
Bless do well. Not having any episodes of chest pain or shortness breath. We'll continue with the same medications. We'll check fasting labs today. His last lipids looked quite good. I'll see him again in 6 months for followup office visit and fasting labs.

## 2013-09-29 NOTE — Patient Instructions (Signed)
Your physician recommends that you return for a FASTING lipid profile: today  Your physician wants you to follow-up in: 6 months  You will receive a reminder letter in the mail two months in advance. If you don't receive a letter, please call our office to schedule the follow-up appointment.  Your physician recommends that you continue on your current medications as directed. Please refer to the Current Medication list given to you today. 

## 2013-10-01 ENCOUNTER — Telehealth: Payer: Self-pay | Admitting: *Deleted

## 2013-10-01 DIAGNOSIS — I2510 Atherosclerotic heart disease of native coronary artery without angina pectoris: Secondary | ICD-10-CM

## 2013-10-01 DIAGNOSIS — E785 Hyperlipidemia, unspecified: Secondary | ICD-10-CM

## 2013-10-01 NOTE — Telephone Encounter (Signed)
Message copied by Antony Odea on Wed Oct 01, 2013 11:38 AM ------      Message from: Vesta Mixer      Created: Mon Sep 29, 2013  5:39 PM       Liver enzymes are mildly elevated but not high enough to stop the statin.      reckeck lipids, hfp, and bmp in 3 months       ------

## 2013-10-01 NOTE — Telephone Encounter (Signed)
PT INFORMED/ LAB DATE GIVEN.

## 2013-10-03 ENCOUNTER — Encounter: Payer: Self-pay | Admitting: Family

## 2013-10-06 ENCOUNTER — Ambulatory Visit (HOSPITAL_COMMUNITY)
Admission: RE | Admit: 2013-10-06 | Discharge: 2013-10-06 | Disposition: A | Discharge status: Home / Self Care | Payer: Medicare Other | Source: Ambulatory Visit | Attending: Family | Admitting: Family

## 2013-10-06 ENCOUNTER — Encounter (HOSPITAL_COMMUNITY): Payer: Self-pay

## 2013-10-06 ENCOUNTER — Ambulatory Visit (INDEPENDENT_AMBULATORY_CARE_PROVIDER_SITE_OTHER)
Admission: RE | Admit: 2013-10-06 | Discharge: 2013-10-06 | Disposition: A | Discharge status: Home / Self Care | Payer: Medicare Other | Source: Ambulatory Visit | Attending: Family | Admitting: Family

## 2013-10-06 ENCOUNTER — Other Ambulatory Visit (HOSPITAL_COMMUNITY): Payer: Self-pay

## 2013-10-06 ENCOUNTER — Encounter: Payer: Self-pay | Admitting: Family

## 2013-10-06 ENCOUNTER — Ambulatory Visit (INDEPENDENT_AMBULATORY_CARE_PROVIDER_SITE_OTHER): Payer: Medicare Other | Admitting: Family

## 2013-10-06 ENCOUNTER — Encounter: Payer: Self-pay | Admitting: *Deleted

## 2013-10-06 VITALS — BP 136/76 | HR 62 | Resp 16 | Ht 67.0 in | Wt 189.0 lb

## 2013-10-06 DIAGNOSIS — I714 Abdominal aortic aneurysm, without rupture, unspecified: Secondary | ICD-10-CM | POA: Insufficient documentation

## 2013-10-06 DIAGNOSIS — Z48812 Encounter for surgical aftercare following surgery on the circulatory system: Secondary | ICD-10-CM

## 2013-10-06 DIAGNOSIS — I739 Peripheral vascular disease, unspecified: Secondary | ICD-10-CM

## 2013-10-06 NOTE — Progress Notes (Signed)
[facility] [place] HISTORY AND PHYSICAL -PAD  Previous Bypass Surgery/ Stent Placement: Yes  History of Present Illness Donald Mckinney is a 67 y.o. male patient that Dr. Myra Gianotti has been following for PAD, he returns today for routine surveillance. He initially suffered a MI in early 2010. This was treated percutaneously. He was found to have poor lower extremity circulation at that time. He has subsequently undergone stenting of his bilateral common iliac arteries as well as his right external iliac artery. He is also taking cilostazol. He only takes this in the morning. Before the above procedure he could not sleep due to calf pain, since the surgery he can sleep all night. Denies non-healing wounds, states he walks a couple of hundred yards before bilateral calf pain occurs which is resolved with rest, this has improved since the above surgery, denies buttocks pain with walking.  Works out in gym 3-4 days/week. Has 2 cardiac stents, had MI in 2009. Patient denies ever having a stroke or TIA symptoms.  Reports New Medical or Surgical History: basal cell cancer removed from left side of face and nose.   Pt Diabetic: No Pt smoker: former smoker, quit in 2009   Pt meds include: Statin :Yes Betablocker: Yes ASA: Yes Other anticoagulants/antiplatelets: Plavix  Past Medical History  Diagnosis Date  . Coronary artery disease   . MI, old     INFERIOR WALL M.I.  . Hyperlipidemia   . Hypertension   . AAA (abdominal aortic aneurysm)   . PVD (peripheral vascular disease)     MODERATE TO SEVERE, INVOLVING THE DISTAL AORTA AND BILATERAL ILIACS  . Myocardial infarction 10/24/2008  . Leg pain     with walking  . Cancer 2005    [facility]    Social History History  Substance Use Topics  . Smoking status: Former Smoker    Types: Cigarettes    Quit date: 10/20/2008  . Smokeless tobacco: Not on file  . Alcohol Use: 0.0 oz/week     Comment: states drinking 2-3 beers a  day    Family History Family History  Problem Relation Age of Onset  . Cancer Mother   . Hyperlipidemia Mother   . Cancer Father     Past Surgical History  Procedure Laterality Date  . Cardiac catheterization      WELL PRESERVED LEFT VENTRICULAR SYSTOLIC FUNCTION. EF 55%  . Coronary angioplasty with stent placement      RIGHT CORONARY ARTERY, LEFT ANTERIOR DESCENDING ARTERY  . Coronary artery bypass graft    . Iliac artery stenting      Allergies  Allergen Reactions  . Ampicillin   . Iodinated Diagnostic Agents     Current Outpatient Prescriptions  Medication Sig Dispense Refill  . aspirin 81 MG tablet Take 81 mg by mouth daily.        . carvedilol (COREG) 12.5 MG tablet TAKE ONE TABLET BY MOUTH TWICE DAILY  60 tablet  11  . cilostazol (PLETAL) 100 MG tablet TAKE ONE TABLET BY MOUTH TWICE DAILY  60 tablet  10  . clopidogrel (PLAVIX) 75 MG tablet TAKE ONE TABLET BY MOUTH EVERY DAY  30 tablet  6  . fenofibrate (TRICOR) 145 MG tablet TAKE ONE TABLET BY MOUTH ONCE DAILY  30 tablet  0  . lisinopril (PRINIVIL,ZESTRIL) 10 MG tablet Take 1 tablet (10 mg total) by mouth daily.  30 tablet  5  . Multiple Vitamin (MULTIVITAMIN) tablet Take 1 tablet by mouth daily.        [name]  NIASPAN 500 MG CR tablet TAKE ONE TABLET BY MOUTH EVERY DAY AT BEDTIME  30 each  7  . NITROSTAT 0.4 MG SL tablet DISSOLVE ONE TABLET UNDER THE TONGUE EVERY 5 MINUTES AS NEEDED FOR CHEST PAIN.  DO NOT EXCEED A TOTAL OF 3 DOSES IN 15 MINUTES. NO RELIEF, C  25 each  3  . rosuvastatin (CRESTOR) 20 MG tablet Take 1 tablet (20 mg total) by mouth daily.  30 tablet  5   No current facility-administered medications for this visit.    ROS: [x]  Positive   [ ]  Denies  General:[ ]  Weight loss,  [ ]  Weight gain, [ ]  Fever, [ ]  chills Neurologic: [ ]  Dizziness, [ ]  Blackouts, [ ]  Seizure [ ]  Stroke, [ ]  "Mini stroke", [ ]  Slurred speech, [ ]  Temporary blindness;  [ ] weakness, [ ]  Hoarseness Cardiac: [ ]  Chest pain/pressure, [  ] Shortness of breath at rest [ ]  Shortness of breath with exertion,  [ ]   Atrial fibrillation or irregular heartbeat Vascular:[ ]  Pain in legs with walking, [ ]  Pain in legs at rest ,[ ]  Pain in legs at night,  [ ]   Non-healing ulcer, [ ]  Blood clot in vein/DVT,   Pulmonary: [ ]  Home oxygen, [ ]   Productive cough, [ ]  Coughing up blood,  [ ]  Asthma,  [ ]  Wheezing Musculoskeletal:  [ ]  Arthritis, [ ]  Low back pain,  [ ]  Joint pain Hematologic:[ ]  Easy Bruising, [ ]  Anemia; [ ]  Hepatitis Gastrointestinal: [ ]  Blood in stool,  [ ]  Gastroesophageal Reflux, [ ]  Trouble swallowing Urinary: [ ]  chronic Kidney disease, [ ]  on HD, [ ]  Burning with urination, [ ]  Frequent urination, [ ]  Difficulty urinating;  Skin: [ ]  Rashes, [ ]  Wounds     Physical Examination  Filed Vitals:   10/06/13 1043  BP: 136/76  Pulse: 62  Resp: 16   Filed Weights   10/06/13 1043  Weight: 189 lb (85.73 kg)   Body mass index is 29.59 kg/(m^2).  General: A&O x 3, WDWN,  Gait: normal Eyes: PERRLA, Pulmonary: CTAB, without wheezes , rales or rhonchi Cardiac: regular Rythm , without murmur          Carotid Bruits Left Right   Negative Negative  Aorta: palpable Radial pulses: 2+ and=                           VASCULAR EXAM: Extremities without ischemic changes  without Gangrene; without open wounds.                                                                                                          LE Pulses LEFT RIGHT       FEMORAL   palpable  faintly palpable        POPLITEAL  not palpable   not palpable       POSTERIOR TIBIAL  not palpable   not palpable        DORSALIS PEDIS  ANTERIOR TIBIAL not palpable  not palpable    Abdomen: soft, NT, no masses. Skin: no rashes, no ulcers noted. Musculoskeletal: no muscle wasting or atrophy.  Neurologic: A&O X 3; Appropriate Affect ; SENSATION: normal; MOTOR FUNCTION:  moving all extremities equally, motor strength 5/5 throughout. Speech is  fluent/normal. CN 2-12 intact.    Non-Invasive Vascular Imaging: DATE: 10/06/2013 ABI: RIGHT 0.46, was 0.41 a year prior, Waveforms: absent PT, monophasic DP;  LEFT 0.65, was 0.65 a year prior, Waveforms: monophasic DUPLEX SCAN OF BYPASS: Patent bilateral iliac stents with no evidence of restenosis. Mild diffuse plaque observed throughout the aorta. Significant (>50%) stenosis of the right internal iliac artery.  ASSESSMENT: Donald Mckinney is a 68 y.o. male who is s/p stenting of his bilateral common iliac arteries as well as his right external iliac artery in 2010; he presents with: mild claudication in calves with significant stenosis of the right internal iliac artery. Severe arterial occlusive disease on the right LE by ABI, and moderate in the left LE. ABI's and iliac velocities about the same over a year, mild claudication symptoms.  PLAN:  I discussed in depth with the patient the nature of atherosclerosis, and emphasized the importance of maximal medical management including strict control of blood pressure, blood glucose, and lipid levels, obtaining regular exercise, and cessation of smoking.  The patient is aware that without maximal medical management the underlying atherosclerotic disease process will progress, limiting the benefit of any interventions. Based on the patient's vascular studies and examination, pt will return to clinic in 1 year for aorto-iliac Duplex and ABI's.  Patient advised to start a graduated walking program in addition to his workouts at the gym.  The patient was given information about PAD including signs, symptoms, treatment, what symptoms should prompt the patient to seek immediate medical care, and risk reduction measures to take.  Charisse March, RN, MSN, FNP-C [facility] Phone: [phone]  Clinic MD: Myra Gianotti 10/06/2013 9:16 AM

## 2013-10-06 NOTE — Patient Instructions (Signed)
Peripheral Vascular Disease Peripheral Vascular Disease (PVD), also called Peripheral Arterial Disease (PAD), is a circulation problem caused by cholesterol (atherosclerotic plaque) deposits in the arteries. PVD commonly occurs in the lower extremities (legs) but it can occur in other areas of the body, such as your arms. The cholesterol buildup in the arteries reduces blood flow which can cause pain and other serious problems. The presence of PVD can place a person at risk for Coronary Artery Disease (CAD).  CAUSES  Causes of PVD can be many. It is usually associated with more than one risk factor such as:   High Cholesterol.  Smoking.  Diabetes.  Lack of exercise or inactivity.  High blood pressure (hypertension).  Obesity.  Family history. SYMPTOMS   When the lower extremities are affected, patients with PVD may experience:  Leg pain with exertion or physical activity. This is called INTERMITTENT CLAUDICATION. This may present as cramping or numbness with physical activity. The location of the pain is associated with the level of blockage. For example, blockage at the abdominal level (distal abdominal aorta) may result in buttock or hip pain. Lower leg arterial blockage may result in calf pain.  As PVD becomes more severe, pain can develop with less physical activity.  In people with severe PVD, leg pain may occur at rest.  Other PVD signs and symptoms:  Leg numbness or weakness.  Coldness in the affected leg or foot, especially when compared to the other leg.  A change in leg color.  Patients with significant PVD are more prone to ulcers or sores on toes, feet or legs. These may take longer to heal or may reoccur. The ulcers or sores can become infected.  If signs and symptoms of PVD are ignored, gangrene may occur. This can result in the loss of toes or loss of an entire limb.  Not all leg pain is related to PVD. Other medical conditions can cause leg pain such  as:  Blood clots (embolism) or Deep Vein Thrombosis.  Inflammation of the blood vessels (vasculitis).  Spinal stenosis. DIAGNOSIS  Diagnosis of PVD can involve several different types of tests. These can include:  Pulse Volume Recording Method (PVR). This test is simple, painless and does not involve the use of X-rays. PVR involves measuring and comparing the blood pressure in the arms and legs. An ABI (Ankle-Brachial Index) is calculated. The normal ratio of blood pressures is 1. As this number becomes smaller, it indicates more severe disease.  < 0.95  indicates significant narrowing in one or more leg vessels.  <0.8 there will usually be pain in the foot, leg or buttock with exercise.  <0.4 will usually have pain in the legs at rest.  <0.25  usually indicates limb threatening PVD.  Doppler detection of pulses in the legs. This test is painless and checks to see if you have a pulses in your legs/feet.  A dye or contrast material (a substance that highlights the blood vessels so they show up on x-ray) may be given to help your caregiver better see the arteries for the following tests. The dye is eliminated from your body by the kidney's. Your caregiver may order blood work to check your kidney function and other laboratory values before the following tests are performed:  Magnetic Resonance Angiography (MRA). An MRA is a picture study of the blood vessels and arteries. The MRA machine uses a large magnet to produce images of the blood vessels.  Computed Tomography Angiography (CTA). A CTA is a   specialized x-ray that looks at how the blood flows in your blood vessels. An IV may be inserted into your arm so contrast dye can be injected.  Angiogram. Is a procedure that uses x-rays to look at your blood vessels. This procedure is minimally invasive, meaning a small incision (cut) is made in your groin. A small tube (catheter) is then inserted into the artery of your groin. The catheter is  guided to the blood vessel or artery your caregiver wants to examine. Contrast dye is injected into the catheter. X-rays are then taken of the blood vessel or artery. After the images are obtained, the catheter is taken out. TREATMENT  Treatment of PVD involves many interventions which may include:  Lifestyle changes:  Quitting smoking.  Exercise.  Following a low fat, low cholesterol diet.  Control of diabetes.  Foot care is very important to the PVD patient. Good foot care can help prevent infection.  Medication:  Cholesterol-lowering medicine.  Blood pressure medicine.  Anti-platelet drugs.  Certain medicines may reduce symptoms of Intermittent Claudication.  Interventional/Surgical options:  Angioplasty. An Angioplasty is a procedure that inflates a balloon in the blocked artery. This opens the blocked artery to improve blood flow.  Stent Implant. A wire mesh tube (stent) is placed in the artery. The stent expands and stays in place, allowing the artery to remain open.  Peripheral Bypass Surgery. This is a surgical procedure that reroutes the blood around a blocked artery to help improve blood flow. This type of procedure may be performed if Angioplasty or stent implants are not an option. SEEK IMMEDIATE MEDICAL CARE IF:   You develop pain or numbness in your arms or legs.  Your arm or leg turns cold, becomes blue in color.  You develop redness, warmth, swelling and pain in your arms or legs. MAKE SURE YOU:   Understand these instructions.  Will watch your condition.  Will get help right away if you are not doing well or get worse. Document Released: 12/14/2004 Document Revised: 01/29/2012 Document Reviewed: 11/10/2008 [employer] Patient Information 2014 [employer].  

## 2013-10-21 ENCOUNTER — Other Ambulatory Visit: Payer: Self-pay | Admitting: Cardiovascular Disease

## 2013-12-08 ENCOUNTER — Other Ambulatory Visit: Payer: Self-pay | Admitting: Cardiovascular Disease

## 2013-12-21 ENCOUNTER — Other Ambulatory Visit: Payer: Self-pay | Admitting: Surgery

## 2013-12-25 ENCOUNTER — Other Ambulatory Visit (INDEPENDENT_AMBULATORY_CARE_PROVIDER_SITE_OTHER): Payer: Medicare Other

## 2013-12-25 DIAGNOSIS — I2510 Atherosclerotic heart disease of native coronary artery without angina pectoris: Secondary | ICD-10-CM

## 2013-12-25 DIAGNOSIS — E785 Hyperlipidemia, unspecified: Secondary | ICD-10-CM

## 2013-12-25 LAB — LIPID PANEL
CHOL/HDL RATIO: 3
Cholesterol: 158 mg/dL (ref 0–200)
HDL: 54.5 mg/dL (ref >39.00)
LDL CALC: 71 mg/dL (ref 0–99)
Triglycerides: 165 mg/dL — ABNORMAL HIGH (ref 0.0–149.0)
VLDL: 33 mg/dL (ref 0.0–40.0)

## 2013-12-25 LAB — HEPATIC FUNCTION PANEL
ALK PHOS: 38 U/L — AB (ref 39–117)
ALT: 29 U/L (ref 0–53)
AST: 30 U/L (ref 0–37)
Albumin: 4.4 g/dL (ref 3.5–5.2)
BILIRUBIN DIRECT: 0 mg/dL (ref 0.0–0.3)
BILIRUBIN TOTAL: 0.7 mg/dL (ref 0.3–1.2)
Total Protein: 7.6 g/dL (ref 6.0–8.3)

## 2013-12-25 LAB — BASIC METABOLIC PANEL
BUN: 19 mg/dL (ref 6–23)
CHLORIDE: 106 meq/L (ref 96–112)
CO2: 25 meq/L (ref 19–32)
CREATININE: 1 mg/dL (ref 0.4–1.5)
Calcium: 9.4 mg/dL (ref 8.4–10.5)
GFR: 79.55 mL/min (ref >60.00)
GLUCOSE: 108 mg/dL — AB (ref 70–99)
POTASSIUM: 4.2 meq/L (ref 3.5–5.1)
Sodium: 138 mEq/L (ref 135–145)

## 2014-01-23 ENCOUNTER — Other Ambulatory Visit: Payer: Self-pay

## 2014-01-23 MED ORDER — CARVEDILOL 12.5 MG PO TABS: ORAL_TABLET | ORAL | Status: DC

## 2014-02-16 ENCOUNTER — Other Ambulatory Visit: Payer: Self-pay | Admitting: Surgery

## 2014-02-16 ENCOUNTER — Other Ambulatory Visit: Payer: Self-pay | Admitting: Cardiovascular Disease

## 2014-03-24 ENCOUNTER — Other Ambulatory Visit: Payer: Self-pay | Admitting: Cardiovascular Disease

## 2014-04-03 ENCOUNTER — Ambulatory Visit (INDEPENDENT_AMBULATORY_CARE_PROVIDER_SITE_OTHER): Payer: Medicare Other | Admitting: Cardiovascular Disease

## 2014-04-03 ENCOUNTER — Other Ambulatory Visit: Payer: Medicare Other

## 2014-04-03 ENCOUNTER — Encounter: Payer: Self-pay | Admitting: Cardiovascular Disease

## 2014-04-03 VITALS — BP 149/89 | HR 64 | Ht 67.0 in | Wt 186.0 lb

## 2014-04-03 DIAGNOSIS — I739 Peripheral vascular disease, unspecified: Secondary | ICD-10-CM

## 2014-04-03 DIAGNOSIS — I2510 Atherosclerotic heart disease of native coronary artery without angina pectoris: Secondary | ICD-10-CM

## 2014-04-03 DIAGNOSIS — E785 Hyperlipidemia, unspecified: Secondary | ICD-10-CM

## 2014-04-03 NOTE — Progress Notes (Signed)
Ladell Pier Date of Birth  08/10/1946       [facility]    [employer] [address], [place], [place]  [address]   [place], [place]  [address] [phone]     [phone]   Fax  [phone]    Fax [phone]  Problem List: 1. Coronary artery disease-status post anterior wall myocardial infarction (Dec. 5, 2009), status post PTCA and stenting of his right coronary artery and later status post PTCA and stenting of his LAD 2. Hyperlipidemia 3. Hypertension 4. History of peripheral vascular disease-followed by Dr. Etta Quill  History of Present Illness: Christipher is a 68 year old gentleman with a history of coronary artery disease. He status post PTCA and stenting of his right coronary artery and also status post PTCA and stenting of left anterior descending artery. He also has a history of hypertension and hyperlipidemia. He has a history of severe peripheral vascular disease and is followed by Dr. Trula Slade.  His blood pressure is typically normal at home. He states that he goes up when he comes to the Dr.  He has been exercising without cp or claudication.  Feb. 17, 2014:  Jeryn is doing very well. He's not had any episodes of chest pain or shortness of breath. He's had some basal cell cancers removed from his face.  He has not been exercising as much as he would like.  He is fasting today.  He has been keeping a record of his BP - his readings are OK.  Nov. 10, 2014:  Eryc is doing well.  His BP is high today but his BP readings at home are ok.  He is playing lots of golf.    He is off the niaspan.  Apr 03, 2014:  Mesiah is doing well.  He has had some periodontal work.   He has been keeping his BP - is getting some low readings.    Current Outpatient Prescriptions on File Prior to Visit  Medication Sig Dispense Refill  . aspirin 81 MG tablet Take 81 mg by mouth daily.        . carvedilol (COREG) 12.5 MG tablet TAKE ONE  TABLET BY MOUTH TWICE DAILY  60 tablet  6  . clopidogrel (PLAVIX) 75 MG tablet TAKE ONE TABLET BY MOUTH ONCE DAILY  30 tablet  3  . CRESTOR 20 MG tablet TAKE ONE TABLET BY MOUTH ONCE DAILY  30 tablet  0  . fenofibrate (TRICOR) 145 MG tablet TAKE ONE TABLET BY MOUTH ONCE DAILY  30 tablet  5  . lisinopril (PRINIVIL,ZESTRIL) 10 MG tablet TAKE ONE TABLET BY MOUTH ONCE DAILY  30 tablet  0  . Multiple Vitamin (MULTIVITAMIN) tablet Take 1 tablet by mouth daily.        [name] NITROSTAT 0.4 MG SL tablet DISSOLVE ONE TABLET UNDER THE TONGUE EVERY 5 MINUTES AS NEEDED FOR CHEST PAIN.  DO NOT EXCEED A TOTAL OF 3 DOSES IN 15 MINUTES. NO RELIEF, C  25 each  3   No current facility-administered medications on file prior to visit.    Allergies  Allergen Reactions  . Ampicillin Anaphylaxis and Diarrhea  . Iodinated Diagnostic Agents Itching and Rash    Past Medical History  Diagnosis Date  . Coronary artery disease   . MI, old     INFERIOR WALL M.I.  . Hyperlipidemia   . Hypertension   . AAA (abdominal aortic aneurysm)   .  PVD (peripheral vascular disease)     MODERATE TO SEVERE, INVOLVING THE DISTAL AORTA AND BILATERAL ILIACS  . Myocardial infarction 10/24/2008  . Leg pain     with walking  . Cancer 2005    BCC  . Cancer of skin, face Dec. 2013  and  Jan.  2014    [facility]  Left cheek and nose    Past Surgical History  Procedure Laterality Date  . Cardiac catheterization      WELL PRESERVED LEFT VENTRICULAR SYSTOLIC FUNCTION. EF 55%  . Coronary angioplasty with stent placement      RIGHT CORONARY ARTERY, LEFT ANTERIOR DESCENDING ARTERY  . Coronary artery bypass graft    . Iliac artery stenting Bilateral Jan. 7, 2010    Bilateral CIA and Right External IA  stents     History  Smoking status  . Former Smoker  . Types: Cigarettes  . Quit date: 10/20/2008  Smokeless tobacco  . Never Used    History  Alcohol Use  . 0.0 oz/week    Comment: states drinking 2-3 beers a day    Family History   Problem Relation Age of Onset  . Cancer Mother   . Hyperlipidemia Mother   . Cancer Father     Reviw of Systems:  Reviewed in the HPI.  All other systems are negative.  Physical Exam: Blood pressure 149/89, pulse 64, height 5\' 7"  (1.702 m), weight 186 lb (84.369 kg). General: Well developed, well nourished, in no acute distress.  Head: Normocephalic, atraumatic, sclera non-icteric, mucus membranes are moist,   Neck: Supple. Carotids are 2 + without bruits. No JVD  Lungs: Clear bilaterally to auscultation.  Heart: regular rate.  normal  S1 S2. No murmurs, gallops or rubs.  Abdomen: Soft, non-tender, non-distended with normal bowel sounds. No hepatomegaly. No rebound/guarding. No masses.  Msk:  Strength and tone are normal  Extremities: No clubbing or cyanosis. No edema.  Distal pedal pulses are 2+ and equal bilaterally.  Neuro: Alert and oriented X 3. Moves all extremities spontaneously.  Psych:  Responds to questions appropriately with a normal affect.  ECG: Nov. 10, 2014:   NSR, at 48. NS ST abnormality. Assessment / Plan:

## 2014-04-03 NOTE — Assessment & Plan Note (Signed)
Donald Mckinney is doing well.  No angina.  Continue current meds.  I will see him in 6 months for OV , fasting labs.

## 2014-04-03 NOTE — Assessment & Plan Note (Signed)
Followed by Dr Brabham 

## 2014-04-03 NOTE — Patient Instructions (Signed)
Your physician recommends that you continue on your current medications as directed. Please refer to the Current Medication list given to you today.  Your physician wants you to follow-up in: 6 months with fasting lab work.  You will receive a reminder letter in the mail two months in advance. If you don't receive a letter, please call our office to schedule the follow-up appointment. You will need to fast for your appointment - nothing to eat or drink after midnight except water

## 2014-04-03 NOTE — Assessment & Plan Note (Signed)
His lipids are well controlled.  Continue crestor.

## 2014-04-07 ENCOUNTER — Other Ambulatory Visit: Payer: Self-pay | Admitting: Cardiovascular Disease

## 2014-04-16 ENCOUNTER — Other Ambulatory Visit: Payer: Self-pay | Admitting: Surgery

## 2014-04-23 ENCOUNTER — Other Ambulatory Visit: Payer: Self-pay | Admitting: Cardiovascular Disease

## 2014-04-27 ENCOUNTER — Other Ambulatory Visit: Payer: Self-pay

## 2014-04-27 MED ORDER — NITROSTAT 0.4 MG SL SUBL: SUBLINGUAL_TABLET | SUBLINGUAL | Status: DC

## 2014-05-05 ENCOUNTER — Other Ambulatory Visit: Payer: Self-pay | Admitting: Cardiovascular Disease

## 2014-06-18 ENCOUNTER — Other Ambulatory Visit: Payer: Self-pay | Admitting: Surgery

## 2014-06-18 DIAGNOSIS — I739 Peripheral vascular disease, unspecified: Secondary | ICD-10-CM

## 2014-08-21 ENCOUNTER — Other Ambulatory Visit: Payer: Self-pay | Admitting: Cardiovascular Disease

## 2014-09-08 ENCOUNTER — Other Ambulatory Visit: Payer: Self-pay

## 2014-09-08 MED ORDER — ROSUVASTATIN CALCIUM 20 MG PO TABS: ORAL_TABLET | ORAL | Status: DC

## 2014-09-08 MED ORDER — LISINOPRIL 10 MG PO TABS: ORAL_TABLET | ORAL | Status: DC

## 2014-09-15 HISTORY — PX: PRE-MALIGNANT / BENIGN SKIN LESION EXCISION: SHX160

## 2014-09-29 ENCOUNTER — Other Ambulatory Visit: Payer: Self-pay

## 2014-09-29 MED ORDER — LISINOPRIL 10 MG PO TABS: ORAL_TABLET | ORAL | Status: DC

## 2014-09-30 ENCOUNTER — Other Ambulatory Visit (INDEPENDENT_AMBULATORY_CARE_PROVIDER_SITE_OTHER): Payer: Medicare Other | Admitting: *Deleted

## 2014-09-30 ENCOUNTER — Ambulatory Visit (INDEPENDENT_AMBULATORY_CARE_PROVIDER_SITE_OTHER): Payer: Medicare Other | Admitting: Cardiovascular Disease

## 2014-09-30 ENCOUNTER — Encounter: Payer: Self-pay | Admitting: Cardiovascular Disease

## 2014-09-30 VITALS — BP 146/80 | HR 71 | Ht 67.0 in | Wt 188.8 lb

## 2014-09-30 DIAGNOSIS — E785 Hyperlipidemia, unspecified: Secondary | ICD-10-CM

## 2014-09-30 DIAGNOSIS — I10 Essential (primary) hypertension: Secondary | ICD-10-CM

## 2014-09-30 DIAGNOSIS — I2510 Atherosclerotic heart disease of native coronary artery without angina pectoris: Secondary | ICD-10-CM

## 2014-09-30 LAB — LIPID PANEL
CHOL/HDL RATIO: 3
Cholesterol: 167 mg/dL (ref 0–200)
HDL: 48.4 mg/dL (ref >39.00)
LDL CALC: 85 mg/dL (ref 0–99)
NonHDL: 118.6
Triglycerides: 170 mg/dL — ABNORMAL HIGH (ref 0.0–149.0)
VLDL: 34 mg/dL (ref 0.0–40.0)

## 2014-09-30 LAB — HEPATIC FUNCTION PANEL
ALT: 28 U/L (ref 0–53)
AST: 28 U/L (ref 0–37)
Albumin: 4.2 g/dL (ref 3.5–5.2)
Alkaline Phosphatase: 33 U/L — ABNORMAL LOW (ref 39–117)
BILIRUBIN DIRECT: 0 mg/dL (ref 0.0–0.3)
Total Bilirubin: 0.6 mg/dL (ref 0.2–1.2)
Total Protein: 8 g/dL (ref 6.0–8.3)

## 2014-09-30 LAB — BASIC METABOLIC PANEL
BUN: 15 mg/dL (ref 6–23)
CALCIUM: 9.7 mg/dL (ref 8.4–10.5)
CHLORIDE: 106 meq/L (ref 96–112)
CO2: 26 mEq/L (ref 19–32)
Creatinine, Ser: 1 mg/dL (ref 0.4–1.5)
GFR: 84.2 mL/min (ref >60.00)
Glucose, Bld: 98 mg/dL (ref 70–99)
Potassium: 4 mEq/L (ref 3.5–5.1)
Sodium: 139 mEq/L (ref 135–145)

## 2014-09-30 MED ORDER — CLOPIDOGREL BISULFATE 75 MG PO TABS: 75.0000 mg | ORAL_TABLET | Freq: Every day | ORAL | Status: DC

## 2014-09-30 NOTE — Patient Instructions (Signed)
Your physician recommends that you continue on your current medications as directed. Please refer to the Current Medication list given to you today.  Your physician recommends that you have lab work today:  Fasting cholesterol, liver and basic metabolic panel  Your physician wants you to follow-up in: 6 months with Dr. Acie Fredrickson.  You will receive a reminder letter in the mail two months in advance. If you don't receive a letter, please call our office to schedule the follow-up appointment.

## 2014-09-30 NOTE — Assessment & Plan Note (Signed)
Donald Mckinney is doing well.  No CP. Remains active.  Plays golf and walks regularly. No CP or dyspnea.

## 2014-09-30 NOTE — Assessment & Plan Note (Signed)
Check fasting labs today. Continue same meds. I will see him again in 6 months.

## 2014-09-30 NOTE — Assessment & Plan Note (Signed)
His blood pressure is a little high here but his readings at home are all very acceptable. He records his blood pressure several times a week.

## 2014-09-30 NOTE — Progress Notes (Signed)
Donald Mckinney Date of Birth  1946-04-14       [facility]    [employer] [address], [place], [place]  [address]   [place], [place]  [address] [phone]     [phone]   Fax  [phone]    Fax [phone]  Problem List: 1. Coronary artery disease-status post anterior wall myocardial infarction (Dec. 5, 2009), status post PTCA and stenting of his right coronary artery and later status post PTCA and stenting of his LAD 2. Hyperlipidemia 3. Hypertension 4. History of peripheral vascular disease-followed by Dr. Etta Quill  History of Present Illness: Donald Mckinney is a 68 year old gentleman with a history of coronary artery disease. He status post PTCA and stenting of his right coronary artery and also status post PTCA and stenting of left anterior descending artery. He also has a history of hypertension and hyperlipidemia. He has a history of severe peripheral vascular disease and is followed by Dr. Trula Slade.  His blood pressure is typically normal at home. He states that he goes up when he comes to the Dr.  He has been exercising without cp or claudication.  Feb. 17, 2014:  Donald Mckinney is doing very well. He's not had any episodes of chest pain or shortness of breath. He's had some basal cell cancers removed from his face.  He has not been exercising as much as he would like.  He is fasting today.  He has been keeping a record of his BP - his readings are OK.  Nov. 10, 2014:  Donald Mckinney is doing well.  His BP is high today but his BP readings at home are ok.  He is playing lots of golf.    He is off the niaspan.  Apr 03, 2014:  Donald Mckinney is doing well.  He has had some periodontal work.   He has been keeping his BP - is getting some low readings.    Nov. 11, 2015:  Donald Mckinney is doing well.  No CP or dyspnea.  .  Playing golf regularly. Still walks 3 times a week .   He brought his BP log - his readings are quite good.   Current  Outpatient Prescriptions on File Prior to Visit  Medication Sig Dispense Refill  . aspirin 81 MG tablet Take 81 mg by mouth daily.      . carvedilol (COREG) 12.5 MG tablet TAKE ONE TABLET BY MOUTH TWICE DAILY 60 tablet 3  . cilostazol (PLETAL) 100 MG tablet TAKE ONE TABLET BY MOUTH TWICE DAILY 60 tablet 10  . clopidogrel (PLAVIX) 75 MG tablet TAKE ONE TABLET BY MOUTH ONCE DAILY 30 tablet 5  . fenofibrate (TRICOR) 145 MG tablet TAKE ONE TABLET BY MOUTH ONCE DAILY 30 tablet 6  . lisinopril (PRINIVIL,ZESTRIL) 10 MG tablet TAKE ONE TABLET BY MOUTH ONCE DAILY 90 tablet 2  . Multiple Vitamin (MULTIVITAMIN) tablet Take 1 tablet by mouth daily.      [name] NITROSTAT 0.4 MG SL tablet DISSOLVE ONE TABLET UNDER THE TONGUE EVERY 5 MINUTES AS NEEDED FOR CHEST PAIN.  DO NOT EXCEED A TOTAL OF 3 DOSES IN 15 MINUTES. NO RELIEF, C 25 tablet 3  . rosuvastatin (CRESTOR) 20 MG tablet TAKE ONE TABLET BY MOUTH ONCE DAILY 30 tablet 4   No current facility-administered medications on file prior to visit.    Allergies  Allergen Reactions  . Ampicillin     Developed prolonged diarrhea - may have developed  C. Diff colitis.    . Iodinated Diagnostic Agents Itching and Rash    Past Medical History  Diagnosis Date  . Coronary artery disease   . MI, old     INFERIOR WALL M.I.  . Hyperlipidemia   . Hypertension   . AAA (abdominal aortic aneurysm)   . PVD (peripheral vascular disease)     MODERATE TO SEVERE, INVOLVING THE DISTAL AORTA AND BILATERAL ILIACS  . Myocardial infarction 10/24/2008  . Leg pain     with walking  . Cancer 2005    BCC  . Cancer of skin, face Dec. 2013  and  Jan.  2014    [facility]  Left cheek and nose    Past Surgical History  Procedure Laterality Date  . Cardiac catheterization      WELL PRESERVED LEFT VENTRICULAR SYSTOLIC FUNCTION. EF 55%  . Coronary angioplasty with stent placement      RIGHT CORONARY ARTERY, LEFT ANTERIOR DESCENDING ARTERY  . Coronary artery bypass graft    . Iliac  artery stenting Bilateral Jan. 7, 2010    Bilateral CIA and Right External IA  stents     History  Smoking status  . Former Smoker  . Types: Cigarettes  . Quit date: 10/20/2008  Smokeless tobacco  . Never Used    History  Alcohol Use  . 0.0 oz/week    Comment: states drinking 2-3 beers a day    Family History  Problem Relation Age of Onset  . Cancer Mother   . Hyperlipidemia Mother   . Cancer Father     Reviw of Systems:  Reviewed in the HPI.  All other systems are negative.  Physical Exam: Blood pressure 146/80, pulse 71, height 5\' 7"  (1.702 m), weight 188 lb 12.8 oz (85.639 kg). General: Well developed, well nourished, in no acute distress.  Head: Normocephalic, atraumatic, sclera non-icteric, mucus membranes are moist,   Neck: Supple. Carotids are 2 + without bruits. No JVD  Lungs: Clear bilaterally to auscultation.  Heart: regular rate.  normal  S1 S2. No murmurs, gallops or rubs.  Abdomen: Soft, non-tender, non-distended with normal bowel sounds. No hepatomegaly. No rebound/guarding. No masses.  Msk:  Strength and tone are normal  Extremities: No clubbing or cyanosis. No edema.  Distal pedal pulses are 2+ and equal bilaterally.  Neuro: Alert and oriented X 3. Moves all extremities spontaneously.  Psych:  Responds to questions appropriately with a normal affect.  ECG: Sep 30, 2014:

## 2014-10-01 ENCOUNTER — Telehealth: Payer: Self-pay | Admitting: Cardiovascular Disease

## 2014-10-01 NOTE — Telephone Encounter (Signed)
Reviewed lab results and plan of care with patient who verbalized understanding 

## 2014-10-01 NOTE — Telephone Encounter (Signed)
New Message  Pt called requests a calll back//sr

## 2014-10-09 ENCOUNTER — Encounter: Payer: Self-pay | Admitting: Family

## 2014-10-12 ENCOUNTER — Encounter: Payer: Self-pay | Admitting: Family

## 2014-10-12 ENCOUNTER — Ambulatory Visit (HOSPITAL_COMMUNITY)
Admission: RE | Admit: 2014-10-12 | Discharge: 2014-10-12 | Disposition: A | Discharge status: Home / Self Care | Payer: Medicare Other | Source: Ambulatory Visit | Attending: Family | Admitting: Family

## 2014-10-12 ENCOUNTER — Ambulatory Visit (INDEPENDENT_AMBULATORY_CARE_PROVIDER_SITE_OTHER): Payer: Medicare Other | Admitting: Family

## 2014-10-12 ENCOUNTER — Ambulatory Visit (INDEPENDENT_AMBULATORY_CARE_PROVIDER_SITE_OTHER)
Admission: RE | Admit: 2014-10-12 | Discharge: 2014-10-12 | Disposition: A | Discharge status: Home / Self Care | Payer: Medicare Other | Source: Ambulatory Visit | Attending: Family | Admitting: Family

## 2014-10-12 VITALS — BP 139/83 | HR 64 | Resp 16 | Ht 67.0 in | Wt 191.0 lb

## 2014-10-12 DIAGNOSIS — I739 Peripheral vascular disease, unspecified: Secondary | ICD-10-CM

## 2014-10-12 DIAGNOSIS — Z48812 Encounter for surgical aftercare following surgery on the circulatory system: Secondary | ICD-10-CM

## 2014-10-12 NOTE — Patient Instructions (Signed)

## 2014-10-12 NOTE — Progress Notes (Signed)
[facility] HISTORY AND PHYSICAL -PAD  History of Present Illness Donald Mckinney is a 68 y.o. male patient that Dr. Trula Slade has been following for PAD, he is s/p bilateral common iliac artery and right external iliac artery stents placed 2010, and returns today for routine surveillance.  He initially suffered a MI in early 2010. This was treated percutaneously. He was found to have poor lower extremity circulation at that time. He has subsequently undergone stenting of his bilateral common iliac arteries as well as his right external iliac artery in 2010. He is also taking cilostazol. He only takes this in the morning. Before the above procedure he could not sleep due to calf pain, since the surgery he can sleep all night. Denies non-healing wounds, states he walks a couple of hundred yards before bilateral calf pain occurs which is resolved with rest, this has improved since the above surgery, denies buttocks pain with walking.  Works out in gym 3-4 days/week. Has 2 cardiac stents, had MI in 2009. Patient denies ever having a stroke or TIA symptoms.  He reports walking about 20-30 minutes total daily  Reports New Medical or Surgical History: basal cell cancer removed from left side of face and nose.   Pt Diabetic: No Pt smoker: former smoker, quit in 2009   Pt meds include: Statin :Yes Betablocker: Yes ASA: Yes Other anticoagulants/antiplatelets: Plavix     Past Medical History  Diagnosis Date  . Coronary artery disease   . MI, old     INFERIOR WALL M.I.  . Hyperlipidemia   . Hypertension   . AAA (abdominal aortic aneurysm)   . PVD (peripheral vascular disease)     MODERATE TO SEVERE, INVOLVING THE DISTAL AORTA AND BILATERAL ILIACS  . Myocardial infarction 10/24/2008  . Leg pain     with walking  . Cancer 2005    BCC  . Cancer of skin, face Dec. 2013  and  Jan.  2014    [facility]  Left cheek and nose    Social History History  Substance Use  Topics  . Smoking status: Former Smoker    Types: Cigarettes    Quit date: 10/20/2008  . Smokeless tobacco: Never Used  . Alcohol Use: 0.0 oz/week     Comment: states drinking 2-3 beers a day    Family History Family History  Problem Relation Age of Onset  . Cancer Mother   . Hyperlipidemia Mother   . Cancer Father     Past Surgical History  Procedure Laterality Date  . Cardiac catheterization      WELL PRESERVED LEFT VENTRICULAR SYSTOLIC FUNCTION. EF 55%  . Coronary angioplasty with stent placement      RIGHT CORONARY ARTERY, LEFT ANTERIOR DESCENDING ARTERY  . Coronary artery bypass graft    . Iliac artery stenting Bilateral Jan. 7, 2010    Bilateral CIA and Right External IA  stents   . Pre-malignant / benign skin lesion excision Left Oct. 27, 2015    Posterior Neck- Benign mole    Allergies  Allergen Reactions  . Ampicillin     Developed prolonged diarrhea - may have developed C. Diff colitis.    . Iodinated Diagnostic Agents Itching and Rash    Current Outpatient Prescriptions  Medication Sig Dispense Refill  . aspirin 81 MG tablet Take 81 mg by mouth daily.      . carvedilol (COREG) 12.5 MG tablet TAKE ONE TABLET BY MOUTH TWICE DAILY 60 tablet 3  .  cilostazol (PLETAL) 100 MG tablet TAKE ONE TABLET BY MOUTH TWICE DAILY 60 tablet 10  . clopidogrel (PLAVIX) 75 MG tablet Take 1 tablet (75 mg total) by mouth daily. 30 tablet 11  . fenofibrate (TRICOR) 145 MG tablet TAKE ONE TABLET BY MOUTH ONCE DAILY 30 tablet 6  . lisinopril (PRINIVIL,ZESTRIL) 10 MG tablet TAKE ONE TABLET BY MOUTH ONCE DAILY 90 tablet 2  . Multiple Vitamin (MULTIVITAMIN) tablet Take 1 tablet by mouth daily.      [name] NITROSTAT 0.4 MG SL tablet DISSOLVE ONE TABLET UNDER THE TONGUE EVERY 5 MINUTES AS NEEDED FOR CHEST PAIN.  DO NOT EXCEED A TOTAL OF 3 DOSES IN 15 MINUTES. NO RELIEF, C 25 tablet 3  . rosuvastatin (CRESTOR) 20 MG tablet TAKE ONE TABLET BY MOUTH ONCE DAILY 30 tablet 4   No current  facility-administered medications for this visit.    ROS: See HPI for pertinent positives and negatives.   Physical Examination  Filed Vitals:   10/12/14 0952  BP: 139/83  Pulse: 64  Resp: 16  Height: 5\' 7"  (1.702 m)  Weight: 191 lb (86.637 kg)  SpO2: 99%   Body mass index is 29.91 kg/(m^2).  General: A&O x 3, WDWN,  Gait: normal Eyes: PERRLA, Pulmonary: CTAB, without wheezes , rales or rhonchi Cardiac: regular Rhythm, no detected murmur     Carotid Bruits Left Right   Negative Negative  Aorta: palpable Radial pulses: 2+ and=   VASCULAR EXAM: Extremities without ischemic changes  without Gangrene; without open wounds.     LE Pulses LEFT RIGHT   FEMORAL  not palpable not palpable    POPLITEAL not palpable  not palpable   POSTERIOR TIBIAL not palpable  not palpable    DORSALIS PEDIS  ANTERIOR TIBIAL not palpable  not palpable    Abdomen: soft, NT, no masses palpated. Skin: no rashes, no ulcers noted. Musculoskeletal: no muscle wasting or atrophy. Neurologic: A&O X 3; Appropriate Affect ; SENSATION: normal; MOTOR FUNCTION: moving all extremities equally, motor strength 5/5 throughout. Speech is fluent/normal.  CN 2-12 intact.     Non-Invasive Vascular Imaging: DATE: 10/12/2014 [facility] - ILIAC DUPLEX EVALUATION    INDICATION: Bilateral common iliac artery and right external iliac artery stents placed 2010.    PREVIOUS INTERVENTION(S):     DUPLEX EXAM:      Peak Systolic Velocity (cm/s)  AORTA - Proximal 58  AORTA - Mid 43  AORTA - Distal 42    RIGHT  LEFT  Peak Systolic Velocity (cm/s) Ratio (if abnormal) Waveform  Peak Systolic Velocity (cm/s) Ratio (if abnormal) Waveform  110  M Common Iliac Artery - Proximal 140  M  Not  Visualized    Common Iliac Artery - Mid 140  M  59  M Common Iliac Artery - Distal 152  M  80  M External Iliac Artery - Proximal 153  M  28  M External Iliac Artery - Mid 241  M  68  M  External Iliac Artery - Distal 221  M     Internal Iliac Artery     0.42 Today's ABI / TBI 0.58  0.46 Previous ABI / TBI (10/06/2013  ) 0.65    Waveform:    M - Monophasic       B - Biphasic       T - Triphasic  If Ankle Brachial Index (ABI) or Toe Brachial Index (TBI) performed, please see complete report       IMPRESSION: Patent  bilateral iliac stents. Mild diffuse plaque observed throughout the aorta.    Compared to the previous exam:  No significant change in comparison to the last exam on 10/06/2013.     ASSESSMENT: Donald Mckinney is a 68 y.o. male who is s/p bilateral common iliac artery and right external iliac artery stents placed 2010, and returns today for routine surveillance.  Bilateral LE aorto iliac Duplex today reveals patent bilateral iliac stents, mild diffuse plaque observed throughout the aorta.  No significant change in comparison to the last exam on 10/06/2013. He has claudication in both calves after walking a couple hundred yards, no change from a year ago.  He has gained some weight. Fortunately he has not used tobacco since 2009 and does not have DM.   PLAN:  Graduated walking program and continued maximal medical management of atherosclerotic risk factors.  I discussed in depth with the patient the nature of atherosclerosis, and emphasized the importance of maximal medical management including strict control of blood pressure, blood glucose, and lipid levels, obtaining regular exercise, and continued cessation of smoking.  The patient is aware that without maximal medical management the underlying atherosclerotic disease process will progress, limiting the benefit of any interventions.  Based on the patient's vascular studies and examination, pt will return to clinic in  1 year for ABI's and bilateral aortoiliac Duplex.  The patient was given information about PAD including signs, symptoms, treatment, what symptoms should prompt the patient to seek immediate medical care, and risk reduction measures to take.  Clemon Chambers, RN, MSN, FNP-C [facility] Phone: [phone]  Clinic MD: Trula Slade  10/12/2014 10:08 AM

## 2014-10-12 NOTE — Addendum Note (Signed)
Addended by: Mena Goes on: 10/12/2014 11:31 AM   Modules accepted: Orders

## 2014-12-01 ENCOUNTER — Other Ambulatory Visit: Payer: Self-pay | Admitting: Cardiovascular Disease

## 2014-12-21 ENCOUNTER — Other Ambulatory Visit: Payer: Self-pay | Admitting: Cardiovascular Disease

## 2014-12-29 ENCOUNTER — Telehealth: Payer: Self-pay | Admitting: Cardiovascular Disease

## 2014-12-29 DIAGNOSIS — E785 Hyperlipidemia, unspecified: Secondary | ICD-10-CM

## 2014-12-29 MED ORDER — CARVEDILOL 12.5 MG PO TABS: 12.5000 mg | ORAL_TABLET | Freq: Two times a day (BID) | ORAL | Status: DC

## 2014-12-29 NOTE — Telephone Encounter (Signed)
New Msg        Pt wife calling, would like new prescription for Carvedilol 12.5 mg.   States that pt was given a prescription with no refills, pt is not out of meds at the moment.    Please return call.

## 2014-12-29 NOTE — Telephone Encounter (Signed)
Spoke with patient's wife who states patient needs refill for Carvedilol. I advised her that refills were not sent with last Rx due to patient was due for 6 month follow-up with Dr. Acie Fredrickson and has not been seen since May 2015.  Wife states patient thought he was supposed to return in 1 year and has an appointment for May 2016.  She reports patient is doing well, no complaints.  Patient has appointment with Dr. Acie Fredrickson for May and has fasting lab work ordered for same day.

## 2014-12-30 ENCOUNTER — Telehealth: Payer: Self-pay | Admitting: Cardiovascular Disease

## 2014-12-30 NOTE — Telephone Encounter (Signed)
Spoke with patient's wife who states she told the operator that she called to make certain the record of patient's 09/30/14 office visit was in the patient's medical record because when I spoke with her yesterday, I said that he had not been seen since May 2015.  I verified that the 11/11 office visit is in the patient's record and apologized for the confusion; patient's wife verbalized understanding.

## 2014-12-30 NOTE — Telephone Encounter (Signed)
New Message  Pt wife called to speak w/ Sharyn Lull. Please call back and discuss.

## 2015-03-03 ENCOUNTER — Other Ambulatory Visit: Payer: Self-pay | Admitting: Cardiovascular Disease

## 2015-04-05 ENCOUNTER — Encounter: Payer: Self-pay | Admitting: Cardiovascular Disease

## 2015-04-05 ENCOUNTER — Other Ambulatory Visit (INDEPENDENT_AMBULATORY_CARE_PROVIDER_SITE_OTHER): Payer: Medicare Other | Admitting: *Deleted

## 2015-04-05 ENCOUNTER — Ambulatory Visit (INDEPENDENT_AMBULATORY_CARE_PROVIDER_SITE_OTHER): Payer: Medicare Other | Admitting: Cardiovascular Disease

## 2015-04-05 VITALS — BP 160/84 | HR 64 | Ht 67.0 in | Wt 191.2 lb

## 2015-04-05 DIAGNOSIS — E785 Hyperlipidemia, unspecified: Secondary | ICD-10-CM

## 2015-04-05 DIAGNOSIS — I10 Essential (primary) hypertension: Secondary | ICD-10-CM

## 2015-04-05 DIAGNOSIS — I2510 Atherosclerotic heart disease of native coronary artery without angina pectoris: Secondary | ICD-10-CM | POA: Diagnosis not present

## 2015-04-05 LAB — HEPATIC FUNCTION PANEL
ALK PHOS: 36 U/L — AB (ref 39–117)
ALT: 23 U/L (ref 0–53)
AST: 28 U/L (ref 0–37)
Albumin: 4.6 g/dL (ref 3.5–5.2)
BILIRUBIN TOTAL: 0.6 mg/dL (ref 0.2–1.2)
Bilirubin, Direct: 0.1 mg/dL (ref 0.0–0.3)
Total Protein: 7.6 g/dL (ref 6.0–8.3)

## 2015-04-05 LAB — LIPID PANEL
CHOL/HDL RATIO: 2
Cholesterol: 129 mg/dL (ref 0–200)
HDL: 54.3 mg/dL (ref >39.00)
LDL CALC: 49 mg/dL (ref 0–99)
NONHDL: 74.7
TRIGLYCERIDES: 131 mg/dL (ref 0.0–149.0)
VLDL: 26.2 mg/dL (ref 0.0–40.0)

## 2015-04-05 LAB — BASIC METABOLIC PANEL
BUN: 14 mg/dL (ref 6–23)
CALCIUM: 9.7 mg/dL (ref 8.4–10.5)
CO2: 27 meq/L (ref 19–32)
Chloride: 103 mEq/L (ref 96–112)
Creatinine, Ser: 0.9 mg/dL (ref 0.40–1.50)
GFR: 89.48 mL/min (ref >60.00)
GLUCOSE: 105 mg/dL — AB (ref 70–99)
Potassium: 3.7 mEq/L (ref 3.5–5.1)
Sodium: 137 mEq/L (ref 135–145)

## 2015-04-05 MED ORDER — LISINOPRIL 10 MG PO TABS: ORAL_TABLET | ORAL | Status: DC

## 2015-04-05 NOTE — Progress Notes (Signed)
Cardiology Office Note   Date:  04/05/2015   ID:  Donald Mckinney, DOB [birth year], MRN [id number]  PCP:  Delphina Cahill, MD  Cardiologist:   Thayer Headings, MD   Chief Complaint  Patient presents with  . Coronary Artery Disease   1. Coronary artery disease-status post anterior wall myocardial infarction (Dec. 5, 2009), status post PTCA and stenting of his right coronary artery and later status post PTCA and stenting of his LAD 2. Hyperlipidemia 3. Hypertension 4. History of peripheral vascular disease-followed by Dr. Etta Quill  History of Present Illness: Donald Mckinney is a 69 year old gentleman with a history of coronary artery disease. He status post PTCA and stenting of his right coronary artery and also status post PTCA and stenting of left anterior descending artery. He also has a history of hypertension and hyperlipidemia. He has a history of severe peripheral vascular disease and is followed by Dr. Trula Slade.  His blood pressure is typically normal at home. He states that he goes up when he comes to the Dr. He has been exercising without cp or claudication.  Feb. 17, 2014:  Donald Mckinney is doing very well. He's not had any episodes of chest pain or shortness of breath. He's had some basal cell cancers removed from his face. He has not been exercising as much as he would like. He is fasting today. He has been keeping a record of his BP - his readings are OK.  Nov. 10, 2014:  Donald Mckinney is doing well. His BP is high today but his BP readings at home are ok. He is playing lots of golf. He is off the niaspan.  Apr 03, 2014:  Donald Mckinney is doing well. He has had some periodontal work. He has been keeping his BP - is getting some low readings.    Nov. 11, 2015:  Donald Mckinney is doing well. No CP or dyspnea. . Playing golf regularly. Still walks 3 times a week .   He brought his BP log - his readings are quite good.    Apr 05, 2015:   Donald Mckinney is a 69 y.o. male who presents for  Follow  up of his CAD  BP is normal at home. A bit elevated here in the office . [name]    No CP   Past Medical History  Diagnosis Date  . Coronary artery disease   . MI, old     INFERIOR WALL M.I.  . Hyperlipidemia   . Hypertension   . AAA (abdominal aortic aneurysm)   . PVD (peripheral vascular disease)     MODERATE TO SEVERE, INVOLVING THE DISTAL AORTA AND BILATERAL ILIACS  . Myocardial infarction 10/24/2008  . Leg pain     with walking  . Cancer 2005    BCC  . Cancer of skin, face Dec. 2013  and  Jan.  2014    [name]  Left cheek and nose    Past Surgical History  Procedure Laterality Date  . Cardiac catheterization      WELL PRESERVED LEFT VENTRICULAR SYSTOLIC FUNCTION. EF 55%  . Coronary angioplasty with stent placement      RIGHT CORONARY ARTERY, LEFT ANTERIOR DESCENDING ARTERY  . Coronary artery bypass graft    . Iliac artery stenting Bilateral Jan. 7, 2010    Bilateral CIA and Right External IA  stents   . Pre-malignant / benign skin lesion excision Left Oct. 27, 2015    Posterior Neck- Benign mole     Current Outpatient Prescriptions  Medication Sig Dispense Refill  . aspirin 81 MG tablet Take 81 mg by mouth daily.      . carvedilol (COREG) 12.5 MG tablet Take 1 tablet (12.5 mg total) by mouth 2 (two) times daily. 180 tablet 3  . cilostazol (PLETAL) 100 MG tablet TAKE ONE TABLET BY MOUTH TWICE DAILY 60 tablet 10  . clopidogrel (PLAVIX) 75 MG tablet Take 1 tablet (75 mg total) by mouth daily. 30 tablet 11  . CRESTOR 20 MG tablet TAKE ONE TABLET BY MOUTH ONCE DAILY 30 tablet 3  . fenofibrate (TRICOR) 145 MG tablet TAKE ONE TABLET BY MOUTH ONCE DAILY 30 tablet 5  . lisinopril (PRINIVIL,ZESTRIL) 10 MG tablet TAKE ONE TABLET BY MOUTH ONCE DAILY 90 tablet 2  . Multiple Vitamin (MULTIVITAMIN) tablet Take 1 tablet by mouth daily.      [name] NITROSTAT 0.4 MG SL tablet DISSOLVE ONE TABLET UNDER THE TONGUE EVERY 5 MINUTES AS NEEDED FOR CHEST PAIN.  DO NOT EXCEED A TOTAL OF 3 DOSES IN 15  MINUTES. NO RELIEF, C 25 tablet 3   No current facility-administered medications for this visit.    Allergies:   Ampicillin and Iodinated diagnostic agents    Social History:  The patient  reports that he quit smoking about 6 years ago. His smoking use included Cigarettes. He has never used smokeless tobacco. He reports that he drinks alcohol. He reports that he does not use illicit drugs.   Family History:  The patient's family history includes Cancer in his father and mother; Hyperlipidemia in his mother.    ROS:  Please see the history of present illness.    Review of Systems: Constitutional:  denies fever, chills, diaphoresis, appetite change and fatigue.  HEENT: denies photophobia, eye pain, redness, hearing loss, ear pain, congestion, sore throat, rhinorrhea, sneezing, neck pain, neck stiffness and tinnitus.  Respiratory: denies SOB, DOE, cough, chest tightness, and wheezing.  Cardiovascular: denies chest pain, palpitations and leg swelling.  Gastrointestinal: denies nausea, vomiting, abdominal pain, diarrhea, constipation, blood in stool.  Genitourinary: denies dysuria, urgency, frequency, hematuria, flank pain and difficulty urinating.  Musculoskeletal: denies  myalgias, back pain, joint swelling, arthralgias and gait problem.   Skin: denies pallor, rash and wound.  Neurological: denies dizziness, seizures, syncope, weakness, light-headedness, numbness and headaches.   Hematological: denies adenopathy, easy bruising, personal or family bleeding history.  Psychiatric/ Behavioral: denies suicidal ideation, mood changes, confusion, nervousness, sleep disturbance and agitation.       All other systems are reviewed and negative.    PHYSICAL EXAM: VS:  BP 160/84 mmHg  Pulse 64  Ht 5\' 7"  (1.702 m)  Wt 86.728 kg (191 lb 3.2 oz)  BMI 29.94 kg/m2 , BMI Body mass index is 29.94 kg/(m^2). GEN: Well nourished, well developed, in no acute distress HEENT: normal Neck: no JVD,  carotid bruits, or masses Cardiac: RRR; no murmurs, rubs, or gallops,no edema  Respiratory:  clear to auscultation bilaterally, normal work of breathing GI: soft, nontender, nondistended, + BS MS: no deformity or atrophy Skin: warm and dry, no rash Neuro:  Strength and sensation are intact Psych: normal   EKG:  EKG is not ordered today. The ekg ordered today demonstrates    Recent Labs: 09/30/2014: ALT 28; BUN 15; Creatinine 1.0; Potassium 4.0; Sodium 139    Lipid Panel    Component Value Date/Time   CHOL 167 09/30/2014 0938   TRIG 170.0* 09/30/2014 0938   HDL 48.40 09/30/2014 0938   CHOLHDL 3 09/30/2014 2725  VLDL 34.0 09/30/2014 0938   LDLCALC 85 09/30/2014 0938   LDLDIRECT 80.7 10/24/2011 1049      Wt Readings from Last 3 Encounters:  04/05/15 86.728 kg (191 lb 3.2 oz)  10/12/14 86.637 kg (191 lb)  09/30/14 85.639 kg (188 lb 12.8 oz)      Other studies Reviewed: Additional studies/ records that were reviewed today include: . Review of the above records demonstrates:    ASSESSMENT AND PLAN:  1. Coronary artery disease-status post anterior wall myocardial infarction (Dec. 5, 2009), status post PTCA and stenting of his right coronary artery and later status post PTCA and stenting of his LAD , no angina .[name] Continue current meds.   2. Hyperlipidemia we check fasting labs today. Continue current medications.  3. Hypertension-  - BP readings at home are ok.    4. History of peripheral vascular disease-followed by Dr. Etta Quill  Current medicines are reviewed at length with the patient today.  The patient does not have concerns regarding medicines.  The following changes have been made:  no change  Labs/ tests ordered today include:  No orders of the defined types were placed in this encounter.     Disposition:   FU with me in 6 month.  Will get fasting labs at that time      Thayer Headings, MD  04/05/2015 10:14 AM    [address] [place], [place], [place]  [address] Phone: [phone]; Fax: [phone]   [facility]  [address] [place], [place]  [address] [phone]    Fax [phone]

## 2015-04-05 NOTE — Patient Instructions (Signed)

## 2015-04-12 ENCOUNTER — Telehealth: Payer: Self-pay

## 2015-04-12 NOTE — Telephone Encounter (Signed)
Spoke with patient's wife who called to ask whether it is safe for patient to take generic Crestor (Rosuvastatin).  She states there was a medication he was prescribed in the past that Dr. Acie Fredrickson told him to only take brand name but patient thinks it was Niacin.  I advised that I do see that brand name Niacin was ordered in the past and that patient has been prescribed Rosuvastatin in the past.  Wife states insurance company will only pay for generic but that she will pay for brand name if needed.  I advised her that patient may try Rosuvastatin and call us if he has problems or concerns.  She verbalized understanding and agreement.

## 2015-04-30 ENCOUNTER — Other Ambulatory Visit: Payer: Self-pay | Admitting: *Deleted

## 2015-04-30 MED ORDER — FENOFIBRATE 145 MG PO TABS: 145.0000 mg | ORAL_TABLET | Freq: Every day | ORAL | Status: DC

## 2015-05-17 ENCOUNTER — Other Ambulatory Visit: Payer: Self-pay

## 2015-07-05 ENCOUNTER — Other Ambulatory Visit: Payer: Self-pay | Admitting: Surgery

## 2015-07-20 ENCOUNTER — Other Ambulatory Visit: Payer: Self-pay | Admitting: Cardiovascular Disease

## 2015-08-02 ENCOUNTER — Other Ambulatory Visit: Payer: Self-pay | Admitting: Cardiovascular Disease

## 2015-08-02 NOTE — Telephone Encounter (Signed)
Medication Detail      Disp Refills Start End     clopidogrel (PLAVIX) 75 MG tablet 30 tablet 11 09/30/2014     Sig - Route: Take 1 tablet (75 mg total) by mouth daily. - Oral    E-Prescribing Status: Receipt confirmed by pharmacy (09/30/2014 10:26 AM EST)     Pharmacy    [facility] [place], [place] [address]

## 2015-09-27 ENCOUNTER — Other Ambulatory Visit: Payer: Self-pay | Admitting: Cardiovascular Disease

## 2015-10-04 ENCOUNTER — Encounter: Payer: Self-pay | Admitting: Cardiovascular Disease

## 2015-10-04 ENCOUNTER — Ambulatory Visit (INDEPENDENT_AMBULATORY_CARE_PROVIDER_SITE_OTHER): Payer: Medicare Other | Admitting: Cardiovascular Disease

## 2015-10-04 ENCOUNTER — Other Ambulatory Visit (INDEPENDENT_AMBULATORY_CARE_PROVIDER_SITE_OTHER): Payer: Medicare Other | Admitting: *Deleted

## 2015-10-04 VITALS — BP 140/72 | HR 66 | Ht 67.0 in | Wt 189.8 lb

## 2015-10-04 DIAGNOSIS — I2510 Atherosclerotic heart disease of native coronary artery without angina pectoris: Secondary | ICD-10-CM | POA: Diagnosis not present

## 2015-10-04 DIAGNOSIS — E785 Hyperlipidemia, unspecified: Secondary | ICD-10-CM

## 2015-10-04 DIAGNOSIS — I10 Essential (primary) hypertension: Secondary | ICD-10-CM

## 2015-10-04 LAB — COMPREHENSIVE METABOLIC PANEL
ALBUMIN: 4.6 g/dL (ref 3.6–5.1)
ALK PHOS: 37 U/L — AB (ref 40–115)
ALT: 34 U/L (ref 9–46)
AST: 31 U/L (ref 10–35)
BUN: 15 mg/dL (ref 7–25)
CHLORIDE: 103 mmol/L (ref 98–110)
CO2: 22 mmol/L (ref 20–31)
CREATININE: 0.85 mg/dL (ref 0.70–1.25)
Calcium: 9.8 mg/dL (ref 8.6–10.3)
Glucose, Bld: 100 mg/dL — ABNORMAL HIGH (ref 65–99)
Potassium: 4 mmol/L (ref 3.5–5.3)
SODIUM: 139 mmol/L (ref 135–146)
TOTAL PROTEIN: 7.9 g/dL (ref 6.1–8.1)
Total Bilirubin: 0.5 mg/dL (ref 0.2–1.2)

## 2015-10-04 LAB — HEPATIC FUNCTION PANEL
ALT: 35 U/L (ref 9–46)
AST: 32 U/L (ref 10–35)
Albumin: 4.6 g/dL (ref 3.6–5.1)
Alkaline Phosphatase: 36 U/L — ABNORMAL LOW (ref 40–115)
BILIRUBIN DIRECT: 0.1 mg/dL (ref <=0.2)
BILIRUBIN INDIRECT: 0.4 mg/dL (ref 0.2–1.2)
TOTAL PROTEIN: 7.8 g/dL (ref 6.1–8.1)
Total Bilirubin: 0.5 mg/dL (ref 0.2–1.2)

## 2015-10-04 LAB — LIPID PANEL
Cholesterol: 136 mg/dL (ref 125–200)
Cholesterol: 139 mg/dL (ref 125–200)
HDL: 51 mg/dL (ref >=40)
HDL: 52 mg/dL (ref >=40)
LDL CALC: 56 mg/dL (ref <130)
LDL CALC: 58 mg/dL (ref <130)
TRIGLYCERIDES: 143 mg/dL (ref <150)
TRIGLYCERIDES: 144 mg/dL (ref <150)
Total CHOL/HDL Ratio: 2.7 Ratio (ref <=5.0)
Total CHOL/HDL Ratio: 2.7 Ratio (ref <=5.0)
VLDL: 29 mg/dL (ref <30)
VLDL: 29 mg/dL (ref <30)

## 2015-10-04 LAB — BASIC METABOLIC PANEL
BUN: 15 mg/dL (ref 7–25)
CALCIUM: 9.8 mg/dL (ref 8.6–10.3)
CO2: 22 mmol/L (ref 20–31)
CREATININE: 0.82 mg/dL (ref 0.70–1.25)
Chloride: 103 mmol/L (ref 98–110)
Glucose, Bld: 100 mg/dL — ABNORMAL HIGH (ref 65–99)
Potassium: 4.3 mmol/L (ref 3.5–5.3)
Sodium: 139 mmol/L (ref 135–146)

## 2015-10-04 NOTE — Patient Instructions (Signed)
Medication Instructions:  Your physician recommends that you continue on your current medications as directed. Please refer to the Current Medication list given to you today.   Labwork: TODAY - cholesterol, liver, basic metabolic panel  Your physician recommends that you return for lab work in: 6 months on the day of or a few days before your office visit with Dr. Nahser.  You will need to FAST for this appointment - nothing to eat or drink after midnight the night before except water.   Testing/Procedures: None Ordered   Follow-Up: Your physician wants you to follow-up in: 6 months with Dr. Nahser.  You will receive a reminder letter in the mail two months in advance. If you don't receive a letter, please call our office to schedule the follow-up appointment.   If you need a refill on your cardiac medications before your next appointment, please call your pharmacy.   Thank you for choosing [facility]! Loriel Diehl, RN [phone]   

## 2015-10-04 NOTE — Progress Notes (Signed)
Cardiology Office Note   Date:  10/04/2015   ID:  Donald Mckinney, DOB 07/20/1946, MRN [id number]  PCP:  Wende Neighbors, MD  Cardiologist:   Thayer Headings, MD   Chief Complaint  Patient presents with  . Coronary Artery Disease   1. Coronary artery disease-status post anterior wall myocardial infarction (Dec. 5, 2009), status post PTCA and stenting of his right coronary artery and later status post PTCA and stenting of his LAD 2. Hyperlipidemia 3. Hypertension 4. History of peripheral vascular disease-followed by Dr. Etta Quill  History of Present Illness: Donald Mckinney is a 68 year old gentleman with a history of coronary artery disease. He status post PTCA and stenting of his right coronary artery and also status post PTCA and stenting of left anterior descending artery. He also has a history of hypertension and hyperlipidemia. He has a history of severe peripheral vascular disease and is followed by Dr. Trula Slade.  His blood pressure is typically normal at home. He states that he goes up when he comes to the Dr. He has been exercising without cp or claudication.  Feb. 17, 2014:  Donald Mckinney is doing very well. He's not had any episodes of chest pain or shortness of breath. He's had some basal cell cancers removed from his face. He has not been exercising as much as he would like. He is fasting today. He has been keeping a record of his BP - his readings are OK.  Nov. 10, 2014:  Donald Mckinney is doing well. His BP is high today but his BP readings at home are ok. He is playing lots of golf. He is off the niaspan.  Apr 03, 2014:  Donald Mckinney is doing well. He has had some periodontal work. He has been keeping his BP - is getting some low readings.    Nov. 11, 2015:  Donald Mckinney is doing well. No CP or dyspnea. . Playing golf regularly. Still walks 3 times a week .   He brought his BP log - his readings are quite good.    Apr 05, 2015:   Donald Mckinney is a 69 y.o. male who presents for  Follow  up of his CAD  BP is normal at home. A bit elevated here in the office . [name]    No CP   Nov. 14, 2016:  Doing well.   Has been busy picking corn.   No Cp or dyspnea.    Keeps a record of his BP  - all are in the normal range.   Past Medical History  Diagnosis Date  . Coronary artery disease   . MI, old     INFERIOR WALL M.I.  . Hyperlipidemia   . Hypertension   . AAA (abdominal aortic aneurysm) ([place])   . PVD (peripheral vascular disease) ([facility])     MODERATE TO SEVERE, INVOLVING THE DISTAL AORTA AND BILATERAL ILIACS  . Myocardial infarction ([place]) 10/24/2008  . Leg pain     with walking  . Cancer ([place]) 2005    BCC  . Cancer of skin, face Dec. 2013  and  Jan.  2014    [facility]  Left cheek and nose    Past Surgical History  Procedure Laterality Date  . Cardiac catheterization      WELL PRESERVED LEFT VENTRICULAR SYSTOLIC FUNCTION. EF 55%  . Coronary angioplasty with stent placement      RIGHT CORONARY ARTERY, LEFT ANTERIOR DESCENDING ARTERY  . Coronary artery bypass graft    . Iliac artery stenting  Bilateral Jan. 7, 2010    Bilateral CIA and Right External IA  stents   . Pre-malignant / benign skin lesion excision Left Oct. 27, 2015    Posterior Neck- Benign mole     Current Outpatient Prescriptions  Medication Sig Dispense Refill  . aspirin 81 MG tablet Take 81 mg by mouth daily.      . carvedilol (COREG) 12.5 MG tablet Take 1 tablet (12.5 mg total) by mouth 2 (two) times daily. 180 tablet 3  . cilostazol (PLETAL) 100 MG tablet TAKE ONE TABLET BY MOUTH TWICE DAILY 60 tablet 6  . clopidogrel (PLAVIX) 75 MG tablet TAKE ONE TABLET BY MOUTH ONCE DAILY 30 tablet 11  . CRESTOR 20 MG tablet TAKE ONE TABLET BY MOUTH ONCE DAILY 30 tablet 6  . fenofibrate (TRICOR) 145 MG tablet Take 1 tablet (145 mg total) by mouth daily. 30 tablet 11  . lisinopril (PRINIVIL,ZESTRIL) 10 MG tablet TAKE ONE TABLET BY MOUTH ONCE DAILY 30 tablet 11  . Multiple Vitamin (MULTIVITAMIN) tablet Take 1 tablet  by mouth daily.      [name] NITROSTAT 0.4 MG SL tablet DISSOLVE ONE TABLET UNDER THE TONGUE EVERY 5 MINUTES AS NEEDED FOR CHEST PAIN.  DO NOT EXCEED A TOTAL OF 3 DOSES IN 15 MINUTES. NO RELIEF, C 25 tablet 3   No current facility-administered medications for this visit.    Allergies:   Ampicillin and Iodinated diagnostic agents    Social History:  The patient  reports that he quit smoking about 6 years ago. His smoking use included Cigarettes. He has never used smokeless tobacco. He reports that he drinks alcohol. He reports that he does not use illicit drugs.   Family History:  The patient's family history includes Cancer in his father and mother; Hyperlipidemia in his mother.    ROS:  Please see the history of present illness.    Review of Systems: Constitutional:  denies fever, chills, diaphoresis, appetite change and fatigue.  HEENT: denies photophobia, eye pain, redness, hearing loss, ear pain, congestion, sore throat, rhinorrhea, sneezing, neck pain, neck stiffness and tinnitus.  Respiratory: denies SOB, DOE, cough, chest tightness, and wheezing.  Cardiovascular: denies chest pain, palpitations and leg swelling.  Gastrointestinal: denies nausea, vomiting, abdominal pain, diarrhea, constipation, blood in stool.  Genitourinary: denies dysuria, urgency, frequency, hematuria, flank pain and difficulty urinating.  Musculoskeletal: denies  myalgias, back pain, joint swelling, arthralgias and gait problem.   Skin: denies pallor, rash and wound.  Neurological: denies dizziness, seizures, syncope, weakness, light-headedness, numbness and headaches.   Hematological: denies adenopathy, easy bruising, personal or family bleeding history.  Psychiatric/ Behavioral: denies suicidal ideation, mood changes, confusion, nervousness, sleep disturbance and agitation.       All other systems are reviewed and negative.    PHYSICAL EXAM: VS:  BP 140/72 mmHg  Pulse 66  Ht 5\' 7"  (1.702 m)  Wt 189 lb  12.8 oz (86.093 kg)  BMI 29.72 kg/m2 , BMI Body mass index is 29.72 kg/(m^2). GEN: Well nourished, well developed, in no acute distress HEENT: normal Neck: no JVD, carotid bruits, or masses Cardiac: RRR; no murmurs, rubs, or gallops,no edema  Respiratory:  clear to auscultation bilaterally, normal work of breathing GI: soft, nontender, nondistended, + BS MS: no deformity or atrophy Skin: warm and dry, no rash Neuro:  Strength and sensation are intact Psych: normal   EKG:  EKG is ordered today. The ekg ordered today demonstrates   NSR at 66.  NS ST abn.  Recent Labs: 04/05/2015: ALT 23; BUN 14; Creatinine, Ser 0.90; Potassium 3.7; Sodium 137    Lipid Panel    Component Value Date/Time   CHOL 129 04/05/2015 0935   TRIG 131.0 04/05/2015 0935   HDL 54.30 04/05/2015 0935   CHOLHDL 2 04/05/2015 0935   VLDL 26.2 04/05/2015 0935   LDLCALC 49 04/05/2015 0935   LDLDIRECT 80.7 10/24/2011 1049      Wt Readings from Last 3 Encounters:  10/04/15 189 lb 12.8 oz (86.093 kg)  04/05/15 191 lb 3.2 oz (86.728 kg)  10/12/14 191 lb (86.637 kg)      Other studies Reviewed: Additional studies/ records that were reviewed today include: . Review of the above records demonstrates:    ASSESSMENT AND PLAN:  1. Coronary artery disease-status post anterior wall myocardial infarction (Dec. 5, 2009), status post PTCA and stenting of his right coronary artery and later status post PTCA and stenting of his LAD , no angina .[name] Continue current meds.   2. Hyperlipidemia:   we check fasting labs today. Continue current medications.  3. Hypertension-  - BP readings at home are ok.    4. History of peripheral vascular disease-followed by Dr. Etta Quill  Current medicines are reviewed at length with the patient today.  The patient does not have concerns regarding medicines.  The following changes have been made:  no change  Labs/ tests ordered today include:  No orders of the defined types were  placed in this encounter.     Disposition:   FU with me in 6 month.  Will get fasting labs at that time      Thayer Headings, MD  10/04/2015 9:36 AM    [facility] [place], [place], [place]  [address] Phone: [phone]; Fax: [phone]   [facility]  [address] [place], [place]  [address] [phone]    Fax [phone]

## 2015-10-12 ENCOUNTER — Encounter: Payer: Self-pay | Admitting: Family

## 2015-10-18 ENCOUNTER — Ambulatory Visit (HOSPITAL_COMMUNITY)
Admission: RE | Admit: 2015-10-18 | Discharge: 2015-10-18 | Disposition: A | Discharge status: Home / Self Care | Payer: Medicare Other | Source: Ambulatory Visit | Attending: Family | Admitting: Family

## 2015-10-18 ENCOUNTER — Ambulatory Visit (INDEPENDENT_AMBULATORY_CARE_PROVIDER_SITE_OTHER)
Admission: RE | Admit: 2015-10-18 | Discharge: 2015-10-18 | Disposition: A | Discharge status: Home / Self Care | Payer: Medicare Other | Source: Ambulatory Visit | Attending: Family | Admitting: Family

## 2015-10-18 ENCOUNTER — Encounter: Payer: Self-pay | Admitting: Family

## 2015-10-18 ENCOUNTER — Ambulatory Visit (INDEPENDENT_AMBULATORY_CARE_PROVIDER_SITE_OTHER): Payer: Medicare Other | Admitting: Family

## 2015-10-18 VITALS — BP 130/76 | HR 61 | Temp 97.1°F | Resp 16 | Ht 67.0 in | Wt 190.0 lb

## 2015-10-18 DIAGNOSIS — Z48812 Encounter for surgical aftercare following surgery on the circulatory system: Secondary | ICD-10-CM | POA: Diagnosis not present

## 2015-10-18 DIAGNOSIS — I719 Aortic aneurysm of unspecified site, without rupture: Secondary | ICD-10-CM | POA: Insufficient documentation

## 2015-10-18 DIAGNOSIS — I739 Peripheral vascular disease, unspecified: Secondary | ICD-10-CM | POA: Diagnosis not present

## 2015-10-18 DIAGNOSIS — I70213 Atherosclerosis of native arteries of extremities with intermittent claudication, bilateral legs: Secondary | ICD-10-CM | POA: Diagnosis not present

## 2015-10-18 DIAGNOSIS — I10 Essential (primary) hypertension: Secondary | ICD-10-CM | POA: Insufficient documentation

## 2015-10-18 DIAGNOSIS — Z95828 Presence of other vascular implants and grafts: Secondary | ICD-10-CM

## 2015-10-18 DIAGNOSIS — E785 Hyperlipidemia, unspecified: Secondary | ICD-10-CM | POA: Insufficient documentation

## 2015-10-18 NOTE — Progress Notes (Signed)
[facility] [place] HISTORY AND PHYSICAL -PAD  History of Present Illness Donald Mckinney is a 69 y.o. male patient that Dr. Trula Slade has been following for PAD who is s/p bilateral common iliac artery and right external iliac artery stents placed in 2010. He returns today for routine surveillance.  He suffered an MI in early 2010. This was treated percutaneously. He was found to have poor lower extremity circulation at that time. He has subsequently undergone stenting of his bilateral common iliac arteries as well as his right external iliac artery in 2010. He is also taking cilostazol. He only takes this in the morning. Before the above procedure he could not sleep due to calf pain, since the surgery he can sleep all night. He denies non-healing wounds, states he walks a couple of hundred yards before bilateral calf pain occurs which is resolved with rest, this has improved since the above surgery, denies buttocks pain with walking.  Works out in gym 3-4 days/week. Has 2 cardiac stents, had MI in 2009. Patient denies ever having a stroke or TIA symptoms.  He reports walking about 20-30 minutes total daily  Reports New Medical or Surgical History: basal cell cancer removed, but has recurred, left side of face and nose.   Pt Diabetic: No Pt smoker: former smoker, quit in 2009   Pt meds include: Statin :Yes Betablocker: Yes ASA: Yes Other anticoagulants/antiplatelets: Plavix    Past Medical History  Diagnosis Date  . Coronary artery disease   . MI, old     INFERIOR WALL M.I.  . Hyperlipidemia   . Hypertension   . AAA (abdominal aortic aneurysm) ([place])   . PVD (peripheral vascular disease) ([facility])     MODERATE TO SEVERE, INVOLVING THE DISTAL AORTA AND BILATERAL ILIACS  . Myocardial infarction ([place]) 10/24/2008  . Leg pain     with walking  . Cancer ([place]) 2005    BCC  . Cancer of skin, face Dec. 2013  and  Jan.  2014    [facility]  Left cheek and nose;  2016  Left  Periorbital    Social History Social History  Substance Use Topics  . Smoking status: Former Smoker    Types: Cigarettes    Quit date: 10/20/2008  . Smokeless tobacco: Never Used  . Alcohol Use: 0.0 oz/week     Comment: states drinking 2-3 beers a day    Family History Family History  Problem Relation Age of Onset  . Cancer Mother   . Hyperlipidemia Mother   . Cancer Father     Past Surgical History  Procedure Laterality Date  . Cardiac catheterization      WELL PRESERVED LEFT VENTRICULAR SYSTOLIC FUNCTION. EF 55%  . Coronary angioplasty with stent placement      RIGHT CORONARY ARTERY, LEFT ANTERIOR DESCENDING ARTERY  . Coronary artery bypass graft    . Iliac artery stenting Bilateral Jan. 7, 2010    Bilateral CIA and Right External IA  stents   . Pre-malignant / benign skin lesion excision Left Oct. 27, 2015    Posterior Neck- Benign mole    Allergies  Allergen Reactions  . Ampicillin     Developed prolonged diarrhea - may have developed C. Diff colitis.    . Iodinated Diagnostic Agents Itching and Rash    Current Outpatient Prescriptions  Medication Sig Dispense Refill  . aspirin 81 MG tablet Take 81 mg by mouth daily.      . carvedilol (COREG) 12.5  MG tablet Take 1 tablet (12.5 mg total) by mouth 2 (two) times daily. 180 tablet 3  . cilostazol (PLETAL) 100 MG tablet TAKE ONE TABLET BY MOUTH TWICE DAILY 60 tablet 6  . clopidogrel (PLAVIX) 75 MG tablet TAKE ONE TABLET BY MOUTH ONCE DAILY 30 tablet 11  . CRESTOR 20 MG tablet TAKE ONE TABLET BY MOUTH ONCE DAILY 30 tablet 6  . fenofibrate (TRICOR) 145 MG tablet Take 1 tablet (145 mg total) by mouth daily. 30 tablet 11  . lisinopril (PRINIVIL,ZESTRIL) 10 MG tablet TAKE ONE TABLET BY MOUTH ONCE DAILY 30 tablet 11  . Multiple Vitamin (MULTIVITAMIN) tablet Take 1 tablet by mouth daily.      [name] NITROSTAT 0.4 MG SL tablet DISSOLVE ONE TABLET UNDER THE TONGUE EVERY 5 MINUTES AS NEEDED FOR CHEST PAIN.  DO NOT EXCEED A TOTAL  OF 3 DOSES IN 15 MINUTES. NO RELIEF, C 25 tablet 3   No current facility-administered medications for this visit.    ROS: See HPI for pertinent positives and negatives.   Physical Examination  Filed Vitals:   10/18/15 0936  BP: 130/76  Pulse: 61  Temp: 97.1 F (36.2 C)  TempSrc: Oral  Resp: 16  Height: 5\' 7"  (1.702 m)  Weight: 190 lb (86.183 kg)  SpO2: 99%   Body mass index is 29.75 kg/(m^2).  General: A&O x 3, WDWN,  Gait: normal Eyes: PERRLA, Pulmonary: CTAB, without wheezes , rales or rhonchi Cardiac: regular rhythm, no detected murmur     Carotid Bruits Left Right   Negative Negative  Aorta: palpable Radial pulses: 2+ and=   VASCULAR EXAM: Extremities without ischemic changes  without Gangrene; without open wounds.     LE Pulses LEFT RIGHT   FEMORAL not palpable not palpable    POPLITEAL not palpable  not palpable   POSTERIOR TIBIAL not palpable  not palpable    DORSALIS PEDIS  ANTERIOR TIBIAL not palpable  not palpable    Abdomen: soft, NT, no masses palpated. Skin: no rashes, no ulcers. Musculoskeletal: no muscle wasting or atrophy. Neurologic: A&O X 3; appropriate affect, normal sensation; MOTOR FUNCTION: moving all extremities equally, motor strength 5/5 throughout. Speech is fluent/normal.  CN 2-12 intact.         Non-Invasive Vascular Imaging: DATE: 10/18/2015 ABI: RIGHT: 0.44 (0.42, 10/12/14), Waveforms: monophasic AT and peroneal, PT not detected, TBI: 0.33 (0.38);  LEFT: 0.64 (0.58), Waveforms: monophasic, TBI: 0.48 (0.49) DUPLEX SCAN OF stents: Decreased visualization of the abdominal vasculature or stents due to overlying bowel gas and patient body habitus.  Patent bilateral CIA and right  external iliac artery stents with greater than 50% left proximal external iliac artery stenosis.  No significant change from 10/12/14.   ASSESSMENT: Donald Mckinney is a 69 y.o. male who is s/p bilateral common iliac artery and right external iliac artery stents placed 2010.  He has claudication in both calves after walking a couple hundred yards, no change from 2 years ago. He has no signs of ischemia in his feet/legs. Fortunately he has not used tobacco since 2009 and does not have DM. He takes cilostazol, ASA, Plavix, and a statin.  ABI's and TBI's remain stable over the last year with severe arterial occlusive disease in the right LE and moderate in the left LE.  Aortoiliac duplex today suggests decreased visualization of the abdominal vasculature or stents due to overlying bowel gas and patient body habitus.  Patent bilateral CIA and right external iliac artery stents with greater  than 50% left proximal external iliac artery stenosis.   No significant change from 10/12/14.   PLAN:  Continue graduated walking program.  Based on the patient's vascular studies and examination, pt will return to clinic in 1 year for ABI's and bilateral aortoiliac Duplex. He knows to return sooner should he develop concerns re the circulation in his legs.    I discussed in depth with the patient the nature of atherosclerosis, and emphasized the importance of maximal medical management including strict control of blood pressure, blood glucose, and lipid levels, obtaining regular exercise, and continued cessation of smoking.  The patient is aware that without maximal medical management the underlying atherosclerotic disease process will progress, limiting the benefit of any interventions.  The patient was given information about PAD including signs, symptoms, treatment, what symptoms should prompt the patient to seek immediate medical care, and risk reduction measures to take.  Clemon Chambers, RN, MSN,  FNP-C [facility] Phone: [phone]  Clinic MD: Trula Slade  10/18/2015 9:53 AM

## 2015-10-18 NOTE — Patient Instructions (Signed)
Peripheral Vascular Disease Peripheral vascular disease (PVD) is a disease of the blood vessels that are not part of your heart and brain. A simple term for PVD is poor circulation. In most cases, PVD narrows the blood vessels that carry blood from your heart to the rest of your body. This can result in a decreased supply of blood to your arms, legs, and internal organs, like your stomach or kidneys. However, it most often affects a person's lower legs and feet. There are two types of PVD.  Organic PVD. This is the more common type. It is caused by damage to the structure of blood vessels.  Functional PVD. This is caused by conditions that make blood vessels contract and tighten (spasm). Without treatment, PVD tends to get worse over time. PVD can also lead to acute ischemic limb. This is when an arm or limb suddenly has trouble getting enough blood. This is a medical emergency. CAUSES Each type of PVD has many different causes. The most common cause of PVD is buildup of a fatty material (plaque) inside of your arteries (atherosclerosis). Small amounts of plaque can break off from the walls of the blood vessels and become lodged in a smaller artery. This blocks blood flow and can cause acute ischemic limb. Other common causes of PVD include:  Blood clots that form inside of blood vessels.  Injuries to blood vessels.  Diseases that cause inflammation of blood vessels or cause blood vessel spasms.  Health behaviors and health history that increase your risk of developing PVD. RISK FACTORS  You may have a greater risk of PVD if you:  Have a family history of PVD.  Have certain medical conditions, including:  High cholesterol.  Diabetes.  High blood pressure (hypertension).  Coronary heart disease.  Past problems with blood clots.  Past injury, such as burns or a broken bone. These may have damaged blood vessels in your limbs.  Buerger disease. This is caused by inflamed blood  vessels in your hands and feet.  Some forms of arthritis.  Rare birth defects that affect the arteries in your legs.  Use tobacco.  Do not get enough exercise.  Are obese.  Are age 50 or older. SIGNS AND SYMPTOMS  PVD may cause many different symptoms. Your symptoms depend on what part of your body is not getting enough blood. Some common signs and symptoms include:  Cramps in your lower legs. This may be a symptom of poor leg circulation (claudication).  Pain and weakness in your legs while you are physically active that goes away when you rest (intermittent claudication).  Leg pain when at rest.  Leg numbness, tingling, or weakness.  Coldness in a leg or foot, especially when compared with the other leg.  Skin or hair changes. These can include:  Hair loss.  Shiny skin.  Pale or bluish skin.  Thick toenails.  Inability to get or maintain an erection (erectile dysfunction). People with PVD are more prone to developing ulcers and sores on their toes, feet, or legs. These may take longer than normal to heal. DIAGNOSIS Your health care provider may diagnose PVD from your signs and symptoms. The health care provider will also do a physical exam. You may have tests to find out what is causing your PVD and determine its severity. Tests may include:  Blood pressure recordings from your arms and legs and measurements of the strength of your pulses (pulse volume recordings).  Imaging studies using sound waves to take pictures of   the blood flow through your blood vessels (Doppler ultrasound).  Injecting a dye into your blood vessels before having imaging studies using:  X-rays (angiogram or arteriogram).  Computer-generated X-rays (CT angiogram).  A powerful electromagnetic field and a computer (magnetic resonance angiogram or MRA). TREATMENT Treatment for PVD depends on the cause of your condition and the severity of your symptoms. It also depends on your age. Underlying  causes need to be treated and controlled. These include long-lasting (chronic) conditions, such as diabetes, high cholesterol, and high blood pressure. You may need to first try making lifestyle changes and taking medicines. Surgery may be needed if these do not work. Lifestyle changes may include:  Quitting smoking.  Exercising regularly.  Following a low-fat, low-cholesterol diet. Medicines may include:  Blood thinners to prevent blood clots.  Medicines to improve blood flow.  Medicines to improve your blood cholesterol levels. Surgical procedures may include:  A procedure that uses an inflated balloon to open a blocked artery and improve blood flow (angioplasty).  A procedure to put in a tube (stent) to keep a blocked artery open (stent implant).  Surgery to reroute blood flow around a blocked artery (peripheral bypass surgery).  Surgery to remove dead tissue from an infected wound on the affected limb.  Amputation. This is surgical removal of the affected limb. This may be necessary in cases of acute ischemic limb that are not improved through medical or surgical treatments. HOME CARE INSTRUCTIONS  Take medicines only as directed by your health care provider.  Do not use any tobacco products, including cigarettes, chewing tobacco, or electronic cigarettes. If you need help quitting, ask your health care provider.  Lose weight if you are overweight, and maintain a healthy weight as directed by your health care provider.  Eat a diet that is low in fat and cholesterol. If you need help, ask your health care provider.  Exercise regularly. Ask your health care provider to suggest some good activities for you.  Use compression stockings or other mechanical devices as directed by your health care provider.  Take good care of your feet.  Wear comfortable shoes that fit well.  Check your feet often for any cuts or sores. SEEK MEDICAL CARE IF:  You have cramps in your legs  while walking.  You have leg pain when you are at rest.  You have coldness in a leg or foot.  Your skin changes.  You have erectile dysfunction.  You have cuts or sores on your feet that are not healing. SEEK IMMEDIATE MEDICAL CARE IF:  Your arm or leg turns cold and blue.  Your arms or legs become red, warm, swollen, painful, or numb.  You have chest pain or trouble breathing.  You suddenly have weakness in your face, arm, or leg.  You become very confused or lose the ability to speak.  You suddenly have a very bad headache or lose your vision.   This information is not intended to replace advice given to you by your health care provider. Make sure you discuss any questions you have with your health care provider.   Document Released: 12/14/2004 Document Revised: 11/27/2014 Document Reviewed: 04/16/2014 Elsevier Interactive Patient Education 2016 [employer]  

## 2016-01-20 ENCOUNTER — Other Ambulatory Visit: Payer: Self-pay | Admitting: Cardiovascular Disease

## 2016-03-07 ENCOUNTER — Other Ambulatory Visit: Payer: Self-pay | Admitting: Cardiovascular Disease

## 2016-03-07 ENCOUNTER — Other Ambulatory Visit: Payer: Self-pay | Admitting: *Deleted

## 2016-03-07 MED ORDER — NITROSTAT 0.4 MG SL SUBL: SUBLINGUAL_TABLET | SUBLINGUAL | Status: DC

## 2016-03-09 ENCOUNTER — Ambulatory Visit (INDEPENDENT_AMBULATORY_CARE_PROVIDER_SITE_OTHER): Payer: Medicare Other | Admitting: Family Medicine

## 2016-03-09 ENCOUNTER — Encounter: Payer: Self-pay | Admitting: Family Medicine

## 2016-03-09 ENCOUNTER — Telehealth: Payer: Self-pay | Admitting: Cardiovascular Disease

## 2016-03-09 VITALS — BP 133/82 | HR 73 | Temp 98.0°F | Resp 16 | Ht 67.0 in | Wt 191.0 lb

## 2016-03-09 DIAGNOSIS — J988 Other specified respiratory disorders: Secondary | ICD-10-CM | POA: Diagnosis not present

## 2016-03-09 DIAGNOSIS — R05 Cough: Secondary | ICD-10-CM | POA: Diagnosis not present

## 2016-03-09 DIAGNOSIS — R059 Cough, unspecified: Secondary | ICD-10-CM

## 2016-03-09 DIAGNOSIS — J22 Unspecified acute lower respiratory infection: Secondary | ICD-10-CM

## 2016-03-09 DIAGNOSIS — J309 Allergic rhinitis, unspecified: Secondary | ICD-10-CM | POA: Diagnosis not present

## 2016-03-09 MED ORDER — HYDROCODONE-HOMATROPINE 5-1.5 MG/5ML PO SYRP
ORAL_SOLUTION | ORAL | Status: DC
Start: 2016-03-09 — End: 2016-04-06

## 2016-03-09 MED ORDER — AZITHROMYCIN 250 MG PO TABS: ORAL_TABLET | ORAL | Status: DC

## 2016-03-09 NOTE — Patient Instructions (Addendum)
IF you received an x-ray today, you will receive an invoice from [facility]. Please contact [facility] at [phone] with questions or concerns regarding your invoice.   IF you received labwork today, you will receive an invoice from [employer]. Please contact [facility] at [phone] with questions or concerns regarding your invoice.   Our billing staff will not be able to assist you with questions regarding bills from these companies.  You will be contacted with the lab results as soon as they are available. The fastest way to get your results is to activate your My Chart account. Instructions are located on the last page of this paperwork. If you have not heard from [place] regarding the results in 2 weeks, please contact this office.     Start antibiotic for possible early bronchitis or less likely community-acquired pneumonia. Coricidin HBP, or Mucinex as needed during the day, and hydrocodone cough syrup only if needed at night to help with suppression of cough. Do not use this medicine if you're having chest pain or shortness of breath. Recommended you be seen if you are having these symptoms. Return to the clinic or go to the nearest emergency room if any of your symptoms worsen or new symptoms occur.   Acute Bronchitis Bronchitis is inflammation of the airways that extend from the windpipe into the lungs (bronchi). The inflammation often causes mucus to develop. This leads to a cough, which is the most common symptom of bronchitis.  In acute bronchitis, the condition usually develops suddenly and goes away over time, usually in a couple weeks. Smoking, allergies, and asthma can make bronchitis worse. Repeated episodes of bronchitis may cause further lung problems.  CAUSES Acute bronchitis is most often caused by the same virus that causes a cold. The virus can spread from person to person (contagious) through coughing, sneezing, and  touching contaminated objects. SIGNS AND SYMPTOMS   Cough.   Fever.   Coughing up mucus.   Body aches.   Chest congestion.   Chills.   Shortness of breath.   Sore throat.  DIAGNOSIS  Acute bronchitis is usually diagnosed through a physical exam. Your health care provider will also ask you questions about your medical history. Tests, such as chest X-rays, are sometimes done to rule out other conditions.  TREATMENT  Acute bronchitis usually goes away in a couple weeks. Oftentimes, no medical treatment is necessary. Medicines are sometimes given for relief of fever or cough. Antibiotic medicines are usually not needed but may be prescribed in certain situations. In some cases, an inhaler may be recommended to help reduce shortness of breath and control the cough. A cool mist vaporizer may also be used to help thin bronchial secretions and make it easier to clear the chest.  HOME CARE INSTRUCTIONS  Get plenty of rest.   Drink enough fluids to keep your urine clear or pale yellow (unless you have a medical condition that requires fluid restriction). Increasing fluids may help thin your respiratory secretions (sputum) and reduce chest congestion, and it will prevent dehydration.   Take medicines only as directed by your health care provider.  If you were prescribed an antibiotic medicine, finish it all even if you start to feel better.  Avoid smoking and secondhand smoke. Exposure to cigarette smoke or irritating chemicals will make bronchitis worse. If you are a smoker, consider using nicotine gum or skin patches to help control withdrawal symptoms. Quitting smoking will help your lungs heal faster.  Reduce the chances of another bout of acute bronchitis by washing your hands frequently, avoiding people with cold symptoms, and trying not to touch your hands to your mouth, nose, or eyes.   Keep all follow-up visits as directed by your health care provider.  SEEK MEDICAL  CARE IF: Your symptoms do not improve after 1 week of treatment.  SEEK IMMEDIATE MEDICAL CARE IF:  You develop an increased fever or chills.   You have chest pain.   You have severe shortness of breath.  You have bloody sputum.   You develop dehydration.  You faint or repeatedly feel like you are going to pass out.  You develop repeated vomiting.  You develop a severe headache. MAKE SURE YOU:   Understand these instructions.  Will watch your condition.  Will get help right away if you are not doing well or get worse.   This information is not intended to replace advice given to you by your health care provider. Make sure you discuss any questions you have with your health care provider.   Document Released: 12/14/2004 Document Revised: 11/27/2014 Document Reviewed: 04/29/2013 Elsevier Interactive Patient Education 2016 [employer].   Allergic Rhinitis Allergic rhinitis is when the mucous membranes in the nose respond to allergens. Allergens are particles in the air that cause your body to have an allergic reaction. This causes you to release allergic antibodies. Through a chain of events, these eventually cause you to release histamine into the blood stream. Although meant to protect the body, it is this release of histamine that causes your discomfort, such as frequent sneezing, congestion, and an itchy, runny nose.  CAUSES Seasonal allergic rhinitis (hay fever) is caused by pollen allergens that may come from grasses, trees, and weeds. Year-round allergic rhinitis (perennial allergic rhinitis) is caused by allergens such as house dust mites, pet dander, and mold spores. SYMPTOMS  Nasal stuffiness (congestion).  Itchy, runny nose with sneezing and tearing of the eyes. DIAGNOSIS Your health care provider can help you determine the allergen or allergens that trigger your symptoms. If you and your health care provider are unable to determine the allergen, skin or blood  testing may be used. Your health care provider will diagnose your condition after taking your health history and performing a physical exam. Your health care provider may assess you for other related conditions, such as asthma, pink eye, or an ear infection. TREATMENT Allergic rhinitis does not have a cure, but it can be controlled by:  Medicines that block allergy symptoms. These may include allergy shots, nasal sprays, and oral antihistamines.  Avoiding the allergen. Hay fever may often be treated with antihistamines in pill or nasal spray forms. Antihistamines block the effects of histamine. There are over-the-counter medicines that may help with nasal congestion and swelling around the eyes. Check with your health care provider before taking or giving this medicine. If avoiding the allergen or the medicine prescribed do not work, there are many new medicines your health care provider can prescribe. Stronger medicine may be used if initial measures are ineffective. Desensitizing injections can be used if medicine and avoidance does not work. Desensitization is when a patient is given ongoing shots until the body becomes less sensitive to the allergen. Make sure you follow up with your health care provider if problems continue. HOME CARE INSTRUCTIONS It is not possible to completely avoid allergens, but you can reduce your symptoms by taking steps to limit your exposure to them. It helps to know exactly  what you are allergic to so that you can avoid your specific triggers. SEEK MEDICAL CARE IF:  You have a fever.  You develop a cough that does not stop easily (persistent).  You have shortness of breath.  You start wheezing.  Symptoms interfere with normal daily activities.   This information is not intended to replace advice given to you by your health care provider. Make sure you discuss any questions you have with your health care provider.   Document Released: 08/01/2001 Document  Revised: 11/27/2014 Document Reviewed: 07/14/2013 Elsevier Interactive Patient Education [employer].

## 2016-03-09 NOTE — Progress Notes (Signed)
Subjective:  By signing my name below, I, Donald Mckinney, attest that this documentation has been prepared under the direction and in the presence of Donald Ray, MD. Electronically Signed: [name], [place]. 03/09/2016 , 2:32 PM .  Patient was seen in Room 13 .   Patient ID: Donald Mckinney, male    DOB: 1945-11-26, 70 y.o.   MRN: [id number] Chief Complaint  Patient presents with  . Cough  . Nasal Congestion   HPI Donald Mckinney is a 70 y.o. male Patient has been having productive cough (yellow sputum) and nasal congestion that started a month ago, and worsened over the past week. He's been waking up at night from coughs and would have some shortness of breath. He was taking claritin and now taking zyrtec. He's also used flonase nasal spray to help loosen up the nasal congestion. He mentions having a low grade fever last night (tmax 99). His daughter-in-law works as a Banker at [employer], and heard rattling in his lungs last night. He denies shortness of breath with activity. He denies inhaler use. He was former smoker (quit in 2009).   Patient Active Problem List   Diagnosis Date Noted  . Aftercare following surgery of the circulatory system, [place] 10/06/2013  . Pain in limb 10/07/2012  . Peripheral vascular disease, unspecified ([place]) 10/07/2012  . Peripheral vascular disease ([place]) 10/24/2011  . CAD (coronary artery disease) 04/27/2011  . HTN (hypertension) 04/27/2011  . Hyperlipidemia 04/27/2011   Past Medical History  Diagnosis Date  . Coronary artery disease   . MI, old     INFERIOR WALL M.I.  . Hyperlipidemia   . Hypertension   . AAA (abdominal aortic aneurysm) ([place])   . PVD (peripheral vascular disease) ([facility])     MODERATE TO SEVERE, INVOLVING THE DISTAL AORTA AND BILATERAL ILIACS  . Myocardial infarction ([place]) 10/24/2008  . Leg pain     with walking  . Cancer ([place]) 2005    BCC  . Cancer of skin, face Dec. 2013  and  Jan.  2014    [facility]  Left cheek  and nose;  2016  Left Periorbital   Past Surgical History  Procedure Laterality Date  . Cardiac catheterization      WELL PRESERVED LEFT VENTRICULAR SYSTOLIC FUNCTION. EF 55%  . Coronary angioplasty with stent placement      RIGHT CORONARY ARTERY, LEFT ANTERIOR DESCENDING ARTERY  . Coronary artery bypass graft    . Iliac artery stenting Bilateral Jan. 7, 2010    Bilateral CIA and Right External IA  stents   . Pre-malignant / benign skin lesion excision Left Oct. 27, 2015    Posterior Neck- Benign mole   Allergies  Allergen Reactions  . Ampicillin     Developed prolonged diarrhea - may have developed C. Diff colitis.    . Iodinated Diagnostic Agents Itching and Rash   Prior to Admission medications   Medication Sig Start Date End Date Taking? Authorizing Provider  aspirin 81 MG tablet Take 81 mg by mouth daily.     Yes Historical Provider, MD  carvedilol (COREG) 12.5 MG tablet TAKE ONE TABLET BY MOUTH TWICE DAILY 01/21/16  Yes Thayer Headings, MD  cetirizine (ZYRTEC) 10 MG tablet Take 10 mg by mouth daily.   Yes Historical Provider, MD  cilostazol (PLETAL) 100 MG tablet TAKE ONE TABLET BY MOUTH TWICE DAILY 07/05/15  Yes Serafina Mitchell, MD  clopidogrel (PLAVIX) 75 MG tablet TAKE ONE TABLET  BY MOUTH ONCE DAILY 09/27/15  Yes Thayer Headings, MD  fenofibrate (TRICOR) 145 MG tablet Take 1 tablet (145 mg total) by mouth daily. 04/30/15  Yes Thayer Headings, MD  fluticasone (FLONASE) 50 MCG/ACT nasal spray Place into both nostrils daily.   Yes Historical Provider, MD  lisinopril (PRINIVIL,ZESTRIL) 10 MG tablet TAKE ONE TABLET BY MOUTH ONCE DAILY 04/05/15  Yes Thayer Headings, MD  Multiple Vitamin (MULTIVITAMIN) tablet Take 1 tablet by mouth daily.     Yes Historical Provider, MD  NITROSTAT 0.4 MG SL tablet DISSOLVE ONE TABLET UNDER THE TONGUE EVERY 5 MINUTES AS NEEDED FOR CHEST PAIN.  DO NOT EXCEED A TOTAL OF 3 DOSES IN 15 MINUTES. NO RELIEF, C 03/07/16  Yes Thayer Headings, MD  rosuvastatin  (CRESTOR) 20 MG tablet TAKE ONE TABLET BY MOUTH ONCE DAILY 03/08/16  Yes Thayer Headings, MD   Social History   Social History  . Marital Status: Married    Spouse Name: N/A  . Number of Children: N/A  . Years of Education: N/A   Occupational History  . Not on file.   Social History Main Topics  . Smoking status: Former Smoker    Types: Cigarettes    Quit date: 10/20/2008  . Smokeless tobacco: Never Used  . Alcohol Use: 0.0 oz/week     Comment: states drinking 2-3 beers a day  . Drug Use: No  . Sexual Activity: Not on file   Other Topics Concern  . Not on file   Social History Narrative   Review of Systems  Constitutional: Positive for fever and fatigue. Negative for chills.  HENT: Positive for congestion. Negative for rhinorrhea, sinus pressure and sore throat.   Respiratory: Positive for cough. Negative for shortness of breath and wheezing.   Gastrointestinal: Negative for nausea and vomiting.  Neurological: Negative for dizziness and headaches.       Objective:   Physical Exam  Constitutional: He is oriented to person, place, and time. He appears well-developed and well-nourished.  HENT:  Head: Normocephalic and atraumatic.  Right Ear: Tympanic membrane, external ear and ear canal normal.  Left Ear: Tympanic membrane, external ear and ear canal normal.  Nose: No rhinorrhea.  Mouth/Throat: Oropharynx is clear and moist and mucous membranes are normal. No oropharyngeal exudate or posterior oropharyngeal erythema.  Eyes: Conjunctivae are normal. Pupils are equal, round, and reactive to light.  Neck: Neck supple.  Cardiovascular: Normal rate, regular rhythm, normal heart sounds and intact distal pulses.   No murmur heard. Pulmonary/Chest: Effort normal.  LLF few distant coarse breath sounds  Abdominal: Soft. There is no tenderness.  Lymphadenopathy:    He has no cervical adenopathy.  Neurological: He is alert and oriented to person, place, and time.  Skin: Skin  is warm and dry. No rash noted.  Psychiatric: He has a normal mood and affect. His behavior is normal.  Vitals reviewed.  Filed Vitals:   03/09/16 1340  BP: 133/82  Pulse: 73  Temp: 98 F (36.7 C)  Resp: 16  Height: 5\' 7"  (1.702 m)  Weight: 191 lb (86.637 kg)  SpO2: 98%      Assessment & Plan:   FRANS GONNERMAN is a 70 y.o. male Cough - Plan: azithromycin (ZITHROMAX) 250 MG tablet, HYDROcodone-homatropine (HYCODAN) 5-1.5 MG/5ML syrup  LRTI (lower respiratory tract infection) - Plan: azithromycin (ZITHROMAX) 250 MG tablet  Allergic rhinitis, unspecified allergic rhinitis type  Suspected underlying allergic rhinitis now with secondary bronchitis/early lower respiratory  tract infection. Afebrile, O2 sat reassuring in the office.   -Start Z-Pak, Mucinex or Coricidin HBP, Hycodan at night if needed. RTC precautions discussed.  Meds ordered this encounter  Medications  . cetirizine (ZYRTEC) 10 MG tablet    Sig: Take 10 mg by mouth daily.  . fluticasone (FLONASE) 50 MCG/ACT nasal spray    Sig: Place into both nostrils daily.  [name] azithromycin (ZITHROMAX) 250 MG tablet    Sig: Take 2 pills by mouth on day 1, then 1 pill by mouth per day on days 2 through 5.    Dispense:  6 tablet    Refill:  0  . HYDROcodone-homatropine (HYCODAN) 5-1.5 MG/5ML syrup    Sig: 71m by mouth a bedtime as needed for cough.    Dispense:  120 mL    Refill:  0   Patient Instructions       IF you received an x-Mckinney today, you will receive an invoice from [facility]. Please contact [facility] at [phone] with questions or concerns regarding your invoice.   IF you received labwork today, you will receive an invoice from [employer]. Please contact [facility] at [phone] with questions or concerns regarding your invoice.   Our billing staff will not be able to assist you with questions regarding bills from these companies.  You will be contacted with  the lab results as soon as they are available. The fastest way to get your results is to activate your My Chart account. Instructions are located on the last page of this paperwork. If you have not heard from [place] regarding the results in 2 weeks, please contact this office.     Start antibiotic for possible early bronchitis or less likely community-acquired pneumonia. Coricidin HBP, or Mucinex as needed during the day, and hydrocodone cough syrup only if needed at night to help with suppression of cough. Do not use this medicine if you're having chest pain or shortness of breath. Recommended you be seen if you are having these symptoms. Return to the clinic or go to the nearest emergency room if any of your symptoms worsen or new symptoms occur.   Acute Bronchitis Bronchitis is inflammation of the airways that extend from the windpipe into the lungs (bronchi). The inflammation often causes mucus to develop. This leads to a cough, which is the most common symptom of bronchitis.  In acute bronchitis, the condition usually develops suddenly and goes away over time, usually in a couple weeks. Smoking, allergies, and asthma can make bronchitis worse. Repeated episodes of bronchitis may cause further lung problems.  CAUSES Acute bronchitis is most often caused by the same virus that causes a cold. The virus can spread from person to person (contagious) through coughing, sneezing, and touching contaminated objects. SIGNS AND SYMPTOMS   Cough.   Fever.   Coughing up mucus.   Body aches.   Chest congestion.   Chills.   Shortness of breath.   Sore throat.  DIAGNOSIS  Acute bronchitis is usually diagnosed through a physical exam. Your health care provider will also ask you questions about your medical history. Tests, such as chest X-rays, are sometimes done to rule out other conditions.  TREATMENT  Acute bronchitis usually goes away in a couple weeks. Oftentimes, no medical treatment is  necessary. Medicines are sometimes given for relief of fever or cough. Antibiotic medicines are usually not needed but may be prescribed in certain situations. In some cases, an inhaler may be recommended to help  reduce shortness of breath and control the cough. A cool mist vaporizer may also be used to help thin bronchial secretions and make it easier to clear the chest.  HOME CARE INSTRUCTIONS  Get plenty of rest.   Drink enough fluids to keep your urine clear or pale yellow (unless you have a medical condition that requires fluid restriction). Increasing fluids may help thin your respiratory secretions (sputum) and reduce chest congestion, and it will prevent dehydration.   Take medicines only as directed by your health care provider.  If you were prescribed an antibiotic medicine, finish it all even if you start to feel better.  Avoid smoking and secondhand smoke. Exposure to cigarette smoke or irritating chemicals will make bronchitis worse. If you are a smoker, consider using nicotine gum or skin patches to help control withdrawal symptoms. Quitting smoking will help your lungs heal faster.   Reduce the chances of another bout of acute bronchitis by washing your hands frequently, avoiding people with cold symptoms, and trying not to touch your hands to your mouth, nose, or eyes.   Keep all follow-up visits as directed by your health care provider.  SEEK MEDICAL CARE IF: Your symptoms do not improve after 1 week of treatment.  SEEK IMMEDIATE MEDICAL CARE IF:  You develop an increased fever or chills.   You have chest pain.   You have severe shortness of breath.  You have bloody sputum.   You develop dehydration.  You faint or repeatedly feel like you are going to pass out.  You develop repeated vomiting.  You develop a severe headache. MAKE SURE YOU:   Understand these instructions.  Will watch your condition.  Will get help right away if you are not doing well  or get worse.   This information is not intended to replace advice given to you by your health care provider. Make sure you discuss any questions you have with your health care provider.   Document Released: 12/14/2004 Document Revised: 11/27/2014 Document Reviewed: 04/29/2013 Elsevier Interactive Patient Education 2016 [employer].   Allergic Rhinitis Allergic rhinitis is when the mucous membranes in the nose respond to allergens. Allergens are particles in the air that cause your body to have an allergic reaction. This causes you to release allergic antibodies. Through a chain of events, these eventually cause you to release histamine into the Mckinney stream. Although meant to protect the body, it is this release of histamine that causes your discomfort, such as frequent sneezing, congestion, and an itchy, runny nose.  CAUSES Seasonal allergic rhinitis (hay fever) is caused by pollen allergens that may come from grasses, trees, and weeds. Year-round allergic rhinitis (perennial allergic rhinitis) is caused by allergens such as house dust mites, pet dander, and mold spores. SYMPTOMS  Nasal stuffiness (congestion).  Itchy, runny nose with sneezing and tearing of the eyes. DIAGNOSIS Your health care provider can help you determine the allergen or allergens that trigger your symptoms. If you and your health care provider are unable to determine the allergen, skin or Mckinney testing may be used. Your health care provider will diagnose your condition after taking your health history and performing a physical exam. Your health care provider may assess you for other related conditions, such as asthma, pink eye, or an ear infection. TREATMENT Allergic rhinitis does not have a cure, but it can be controlled by:  Medicines that block allergy symptoms. These may include allergy shots, nasal sprays, and oral antihistamines.  Avoiding the allergen. Hay  fever may often be treated with antihistamines in  pill or nasal spray forms. Antihistamines block the effects of histamine. There are over-the-counter medicines that may help with nasal congestion and swelling around the eyes. Check with your health care provider before taking or giving this medicine. If avoiding the allergen or the medicine prescribed do not work, there are many new medicines your health care provider can prescribe. Stronger medicine may be used if initial measures are ineffective. Desensitizing injections can be used if medicine and avoidance does not work. Desensitization is when a patient is given ongoing shots until the body becomes less sensitive to the allergen. Make sure you follow up with your health care provider if problems continue. HOME CARE INSTRUCTIONS It is not possible to completely avoid allergens, but you can reduce your symptoms by taking steps to limit your exposure to them. It helps to know exactly what you are allergic to so that you can avoid your specific triggers. SEEK MEDICAL CARE IF:  You have a fever.  You develop a cough that does not stop easily (persistent).  You have shortness of breath.  You start wheezing.  Symptoms interfere with normal daily activities.   This information is not intended to replace advice given to you by your health care provider. Make sure you discuss any questions you have with your health care provider.   Document Released: 08/01/2001 Document Revised: 11/27/2014 Document Reviewed: 07/14/2013 Elsevier Interactive Patient Education [employer].     I personally performed the services described in this documentation, which was scribed in my presence. The recorded information has been reviewed and considered, and addended by me as needed.

## 2016-03-09 NOTE — Telephone Encounter (Signed)
New Message  Pt called states that he is currently treating himself with allergy medication but after a certain time at night he starts coughing hysterically. But he wants to make sure that his heart is ok. Pt states that a nurse at another office says that they heard crackling in his chest and the pt says that he doesn't want it to get too bad. Please call back to discuss.

## 2016-03-09 NOTE — Telephone Encounter (Signed)
Spoke with patient who states he is having difficulty with seasonal allergy symptoms and coughing.  He states he is concerned about his heart because he cannot lay flat at night.  States he has been taking Coricidin and Claritin for over a week with no relief.  He started using flonase 3 days ago.  He does not have hx of CHF but is concerned because of past hx of MI and known CAD and he states symptoms are getting worse this week.  He does not see a PCP.  I spoke with Dr. Acie Fredrickson who is in the office and he advised patient be seen tomorrow by [facility].  I scheduled him for 11 am tomorrow.  Patient verbalized understanding and agreement with plan.

## 2016-03-10 ENCOUNTER — Ambulatory Visit: Payer: Medicare Other | Admitting: Physician Assistant

## 2016-04-06 ENCOUNTER — Encounter: Payer: Self-pay | Admitting: Cardiovascular Disease

## 2016-04-06 ENCOUNTER — Ambulatory Visit (INDEPENDENT_AMBULATORY_CARE_PROVIDER_SITE_OTHER): Payer: Medicare Other | Admitting: Cardiovascular Disease

## 2016-04-06 VITALS — BP 140/72 | HR 68 | Ht 67.0 in | Wt 186.0 lb

## 2016-04-06 DIAGNOSIS — I2510 Atherosclerotic heart disease of native coronary artery without angina pectoris: Secondary | ICD-10-CM

## 2016-04-06 DIAGNOSIS — I10 Essential (primary) hypertension: Secondary | ICD-10-CM | POA: Diagnosis not present

## 2016-04-06 LAB — LIPID PANEL
CHOL/HDL RATIO: 2.7 ratio (ref <=5.0)
CHOLESTEROL: 155 mg/dL (ref 125–200)
HDL: 58 mg/dL (ref >=40)
LDL Cholesterol: 74 mg/dL (ref <130)
Triglycerides: 114 mg/dL (ref <150)
VLDL: 23 mg/dL (ref <30)

## 2016-04-06 LAB — COMPREHENSIVE METABOLIC PANEL
ALK PHOS: 36 U/L — AB (ref 40–115)
ALT: 15 U/L (ref 9–46)
AST: 22 U/L (ref 10–35)
Albumin: 4.6 g/dL (ref 3.6–5.1)
BILIRUBIN TOTAL: 0.6 mg/dL (ref 0.2–1.2)
BUN: 15 mg/dL (ref 7–25)
CO2: 22 mmol/L (ref 20–31)
CREATININE: 0.9 mg/dL (ref 0.70–1.25)
Calcium: 9.2 mg/dL (ref 8.6–10.3)
Chloride: 104 mmol/L (ref 98–110)
Glucose, Bld: 101 mg/dL — ABNORMAL HIGH (ref 65–99)
POTASSIUM: 4.2 mmol/L (ref 3.5–5.3)
Sodium: 137 mmol/L (ref 135–146)
TOTAL PROTEIN: 7.4 g/dL (ref 6.1–8.1)

## 2016-04-06 NOTE — Addendum Note (Signed)
Addended by: Eulis Foster on: 04/06/2016 09:24 AM   Modules accepted: Orders

## 2016-04-06 NOTE — Progress Notes (Signed)
Cardiology Office Note   Date:  04/06/2016   ID:  Donald Mckinney, DOB [birth year], MRN [id number]  PCP:  Wende Neighbors, MD  Cardiologist:   [name], MD   Chief Complaint  Patient presents with  . Coronary Artery Disease   1. Coronary artery disease-status post anterior wall myocardial infarction (Dec. 5, 2009), status post PTCA and stenting of his right coronary artery and later status post PTCA and stenting of his LAD 2. Hyperlipidemia 3. Hypertension 4. History of peripheral vascular disease-followed by Dr. Etta Quill  History of Present Illness: Donald Mckinney is a 69 year old gentleman with a history of coronary artery disease. He status post PTCA and stenting of his right coronary artery and also status post PTCA and stenting of left anterior descending artery. He also has a history of hypertension and hyperlipidemia. He has a history of severe peripheral vascular disease and is followed by Dr. Trula Slade.  His blood pressure is typically normal at home. He states that he goes up when he comes to the Dr. He has been exercising without cp or claudication.  Feb. 17, 2014:  Donald Mckinney is doing very well. He's not had any episodes of chest pain or shortness of breath. He's had some basal cell cancers removed from his face. He has not been exercising as much as he would like. He is fasting today. He has been keeping a record of his BP - his readings are OK.  Nov. 10, 2014:  Donald Mckinney is doing well. His BP is high today but his BP readings at home are ok. He is playing lots of golf. He is off the niaspan.  Apr 03, 2014:  Donald Mckinney is doing well. He has had some periodontal work. He has been keeping his BP - is getting some low readings.    Nov. 11, 2015:  Donald Mckinney is doing well. No CP or dyspnea. . Playing golf regularly. Still walks 3 times a week .   He brought his BP log - his readings are quite good.    Apr 05, 2015:   Donald Mckinney is a 70 y.o. male who presents for  Follow up  of his CAD  BP is normal at home. A bit elevated here in the office . [name]    No CP   Nov. 14, 2016:  Doing well.   Has been busy picking corn.   No Cp or dyspnea.    Keeps a record of his BP  - all are in the normal range.   Apr 06, 2016:  Doing well.  No CP or dyspnea  Getting some exercise   Past Medical History  Diagnosis Date  . Coronary artery disease   . MI, old     INFERIOR WALL M.I.  . Hyperlipidemia   . Hypertension   . AAA (abdominal aortic aneurysm) ([place])   . PVD (peripheral vascular disease) ([facility])     MODERATE TO SEVERE, INVOLVING THE DISTAL AORTA AND BILATERAL ILIACS  . Myocardial infarction ([place]) 10/24/2008  . Leg pain     with walking  . Cancer ([place]) 2005    BCC  . Cancer of skin, face Dec. 2013  and  Jan.  2014    [facility]  Left cheek and nose;  2016  Left Periorbital    Past Surgical History  Procedure Laterality Date  . Cardiac catheterization      WELL PRESERVED LEFT VENTRICULAR SYSTOLIC FUNCTION. EF 55%  . Coronary angioplasty with stent placement  RIGHT CORONARY ARTERY, LEFT ANTERIOR DESCENDING ARTERY  . Coronary artery bypass graft    . Iliac artery stenting Bilateral Jan. 7, 2010    Bilateral CIA and Right External IA  stents   . Pre-malignant / benign skin lesion excision Left Oct. 27, 2015    Posterior Neck- Benign mole     Current Outpatient Prescriptions  Medication Sig Dispense Refill  . aspirin 81 MG tablet Take 81 mg by mouth daily.      . carvedilol (COREG) 12.5 MG tablet TAKE ONE TABLET BY MOUTH TWICE DAILY 180 tablet 2  . cetirizine (ZYRTEC) 10 MG tablet Take 10 mg by mouth daily as needed for allergies.     . cilostazol (PLETAL) 100 MG tablet TAKE ONE TABLET BY MOUTH TWICE DAILY 60 tablet 6  . clopidogrel (PLAVIX) 75 MG tablet TAKE ONE TABLET BY MOUTH ONCE DAILY 30 tablet 11  . fenofibrate (TRICOR) 145 MG tablet Take 1 tablet (145 mg total) by mouth daily. 30 tablet 11  . lisinopril (PRINIVIL,ZESTRIL) 10 MG tablet TAKE ONE  TABLET BY MOUTH ONCE DAILY 30 tablet 11  . Multiple Vitamin (MULTIVITAMIN) tablet Take 1 tablet by mouth daily.      [name] NITROSTAT 0.4 MG SL tablet DISSOLVE ONE TABLET UNDER THE TONGUE EVERY 5 MINUTES AS NEEDED FOR CHEST PAIN.  DO NOT EXCEED A TOTAL OF 3 DOSES IN 15 MINUTES. NO RELIEF, C 25 tablet 1  . rosuvastatin (CRESTOR) 20 MG tablet TAKE ONE TABLET BY MOUTH ONCE DAILY 30 tablet 11   No current facility-administered medications for this visit.    Allergies:   Ampicillin and Iodinated diagnostic agents    Social History:  The patient  reports that he quit smoking about 7 years ago. His smoking use included Cigarettes. He has never used smokeless tobacco. He reports that he drinks alcohol. He reports that he does not use illicit drugs.   Family History:  The patient's family history includes Cancer in his father and mother; Hyperlipidemia in his mother.    ROS:  Please see the history of present illness.    Review of Systems: Constitutional:  denies fever, chills, diaphoresis, appetite change and fatigue.  HEENT: denies photophobia, eye pain, redness, hearing loss, ear pain, congestion, sore throat, rhinorrhea, sneezing, neck pain, neck stiffness and tinnitus.  Respiratory: denies SOB, DOE, cough, chest tightness, and wheezing.  Cardiovascular: denies chest pain, palpitations and leg swelling.  Gastrointestinal: denies nausea, vomiting, abdominal pain, diarrhea, constipation, blood in stool.  Genitourinary: denies dysuria, urgency, frequency, hematuria, flank pain and difficulty urinating.  Musculoskeletal: denies  myalgias, back pain, joint swelling, arthralgias and gait problem.   Skin: denies pallor, rash and wound.  Neurological: denies dizziness, seizures, syncope, weakness, light-headedness, numbness and headaches.   Hematological: denies adenopathy, easy bruising, personal or family bleeding history.  Psychiatric/ Behavioral: denies suicidal ideation, mood changes, confusion,  nervousness, sleep disturbance and agitation.       All other systems are reviewed and negative.    PHYSICAL EXAM: VS:  BP 140/72 mmHg  Pulse 68  Ht 5\' 7"  (1.702 m)  Wt 186 lb (84.369 kg)  BMI 29.12 kg/m2  SpO2 98% , BMI Body mass index is 29.12 kg/(m^2). GEN: Well nourished, well developed, in no acute distress HEENT: normal Neck: no JVD, carotid bruits, or masses Cardiac: RRR; no murmurs, rubs, or gallops,no edema  Respiratory:  clear to auscultation bilaterally, normal work of breathing GI: soft, nontender, nondistended, + BS MS: no deformity or atrophy  Skin: warm and dry, no rash Neuro:  Strength and sensation are intact Psych: normal   EKG:  EKG is ordered today. The ekg ordered today demonstrates   NSR at 66.  NS ST abn.    Recent Labs: 10/04/2015: ALT 34; BUN 15; Creat 0.85; Potassium 4.0; Sodium 139    Lipid Panel    Component Value Date/Time   CHOL 139 10/04/2015 1034   TRIG 144 10/04/2015 1034   HDL 52 10/04/2015 1034   CHOLHDL 2.7 10/04/2015 1034   VLDL 29 10/04/2015 1034   LDLCALC 58 10/04/2015 1034   LDLDIRECT 80.7 10/24/2011 1049      Wt Readings from Last 3 Encounters:  04/06/16 186 lb (84.369 kg)  03/09/16 191 lb (86.637 kg)  10/18/15 190 lb (86.183 kg)      Other studies Reviewed: Additional studies/ records that were reviewed today include: . Review of the above records demonstrates:    ASSESSMENT AND PLAN:  1. Coronary artery disease-status post anterior wall myocardial infarction (Dec. 5, 2009), status post PTCA and stenting of his right coronary artery and later status post PTCA and stenting of his LAD , no angina .[name] Continue current meds.   2. Hyperlipidemia:   we check fasting labs today. Continue current medications.  3. Hypertension-  - BP readings at home are ok.    4. History of peripheral vascular disease-followed by Dr. Etta Quill  Current medicines are reviewed at length with the patient today.  The patient does not have  concerns regarding medicines.  The following changes have been made:  no change  Labs/ tests ordered today include:  No orders of the defined types were placed in this encounter.     Disposition:   FU with me in 6 month.  Will get fasting labs at that time      [name], MD  04/06/2016 9:04 AM    [place], [place], [place]  [address] Phone: [phone]; Fax: [phone]   [facility]  [address] [place], [place]  [address] [phone]    Fax [phone]

## 2016-04-06 NOTE — Patient Instructions (Signed)
Medication Instructions:  Your physician recommends that you continue on your current medications as directed. Please refer to the Current Medication list given to you today.   Labwork: TODAY:  LIPID & CMET  Testing/Procedures: None ordered  Follow-Up: Your physician wants you to follow-up in: 6 months with Dr. Acie Fredrickson. You will receive a reminder letter in the mail two months in advance. If you don't receive a letter, please call our office to schedule the follow-up appointment.  Any Other Special Instructions Will Be Listed Below (If Applicable).  If you need a refill on your cardiac medications before your next appointment, please call your pharmacy.

## 2016-04-20 ENCOUNTER — Other Ambulatory Visit: Payer: Self-pay | Admitting: Cardiovascular Disease

## 2016-05-09 ENCOUNTER — Other Ambulatory Visit: Payer: Self-pay

## 2016-05-09 ENCOUNTER — Other Ambulatory Visit: Payer: Self-pay | Admitting: Cardiovascular Disease

## 2016-05-09 MED ORDER — FENOFIBRATE 145 MG PO TABS: 145.0000 mg | ORAL_TABLET | Freq: Every day | ORAL | Status: DC

## 2016-09-04 ENCOUNTER — Other Ambulatory Visit: Payer: Self-pay | Admitting: Surgery

## 2016-09-22 ENCOUNTER — Other Ambulatory Visit: Payer: Self-pay | Admitting: Cardiovascular Disease

## 2016-10-10 ENCOUNTER — Encounter: Payer: Self-pay | Admitting: Cardiovascular Disease

## 2016-10-10 ENCOUNTER — Ambulatory Visit (INDEPENDENT_AMBULATORY_CARE_PROVIDER_SITE_OTHER): Payer: Medicare Other | Admitting: Cardiovascular Disease

## 2016-10-10 VITALS — BP 138/82 | HR 67 | Ht 67.0 in | Wt 188.0 lb

## 2016-10-10 DIAGNOSIS — I10 Essential (primary) hypertension: Secondary | ICD-10-CM | POA: Diagnosis not present

## 2016-10-10 DIAGNOSIS — E782 Mixed hyperlipidemia: Secondary | ICD-10-CM | POA: Diagnosis not present

## 2016-10-10 DIAGNOSIS — I2510 Atherosclerotic heart disease of native coronary artery without angina pectoris: Secondary | ICD-10-CM | POA: Diagnosis not present

## 2016-10-10 LAB — COMPREHENSIVE METABOLIC PANEL
ALK PHOS: 33 U/L — AB (ref 40–115)
ALT: 20 U/L (ref 9–46)
AST: 22 U/L (ref 10–35)
Albumin: 4.5 g/dL (ref 3.6–5.1)
BILIRUBIN TOTAL: 0.5 mg/dL (ref 0.2–1.2)
BUN: 15 mg/dL (ref 7–25)
CO2: 24 mmol/L (ref 20–31)
CREATININE: 0.96 mg/dL (ref 0.70–1.25)
Calcium: 9.3 mg/dL (ref 8.6–10.3)
Chloride: 106 mmol/L (ref 98–110)
Glucose, Bld: 99 mg/dL (ref 65–99)
POTASSIUM: 4.3 mmol/L (ref 3.5–5.3)
Sodium: 139 mmol/L (ref 135–146)
TOTAL PROTEIN: 7.1 g/dL (ref 6.1–8.1)

## 2016-10-10 LAB — LIPID PANEL
CHOLESTEROL: 130 mg/dL (ref <200)
HDL: 60 mg/dL (ref >40)
LDL CALC: 46 mg/dL (ref <100)
TRIGLYCERIDES: 118 mg/dL (ref <150)
Total CHOL/HDL Ratio: 2.2 Ratio (ref <5.0)
VLDL: 24 mg/dL (ref <30)

## 2016-10-10 NOTE — Progress Notes (Addendum)
Cardiology Office Note   Date:  10/10/2016   ID:  Donald Mckinney, DOB May 28, 1946, MRN [id number]  PCP:  Wende Neighbors, MD  Cardiologist:   [name], MD   Chief Complaint  Patient presents with  . Coronary Artery Disease   1. Coronary artery disease-status post anterior wall myocardial infarction (Dec. 5, 2009), status post PTCA and stenting of his right coronary artery and later status post PTCA and stenting of his LAD 2. Hyperlipidemia 3. Hypertension 4. History of peripheral vascular disease-followed by [name] is a 70 year old gentleman with a history of coronary artery disease. He status post PTCA and stenting of his right coronary artery and also status post PTCA and stenting of left anterior descending artery. He also has a history of hypertension and hyperlipidemia. He has a history of severe peripheral vascular disease and is followed by Dr. Trula Slade.  His blood pressure is typically normal at home. He states that he goes up when he comes to the Dr. He has been exercising without cp or claudication.  Feb. 17, 2014:  Donald Mckinney is doing very well. He's not had any episodes of chest pain or shortness of breath. He's had some basal cell cancers removed from his face. He has not been exercising as much as he would like. He is fasting today. He has been keeping a record of his BP - his readings are OK.  Nov. 10, 2014:  Donald Mckinney is doing well. His BP is high today but his BP readings at home are ok. He is playing lots of golf. He is off the niaspan.  Apr 03, 2014:  Donald Mckinney is doing well. He has had some periodontal work. He has been keeping his BP - is getting some low readings.    Nov. 11, 2015:  Donald Mckinney is doing well. No CP or dyspnea. . Playing golf regularly. Still walks 3 times a week .   He brought his BP log - his readings are quite good.    Apr 05, 2015:   Donald Mckinney is a 70 y.o. male who presents for  Follow up of his CAD  BP is normal  at home. A bit elevated here in the office . [name]    No CP   Nov. 14, 2016:  Doing well.   Has been busy picking corn.   No Cp or dyspnea.    Keeps a record of his BP  - all are in the normal range.   Apr 06, 2016:  Doing well.  No CP or dyspnea  Getting some exercise  Nov. 21, 2017:  Donald Mckinney is seen for follow up of his cad Wants a 90 day supply of meds.  No CP or dyspnea     Past Medical History:  Diagnosis Date  . AAA (abdominal aortic aneurysm) ([place])   . Cancer ([place]) 2005   BCC  . Cancer of skin, face Dec. 2013  and  Jan.  2014   [facility]  Left cheek and nose;  2016  Left Periorbital  . Coronary artery disease   . Hyperlipidemia   . Hypertension   . Leg pain    with walking  . MI, old    INFERIOR WALL M.I.  . Myocardial infarction 10/24/2008  . PVD (peripheral vascular disease) ([place])    MODERATE TO SEVERE, INVOLVING THE DISTAL AORTA AND BILATERAL ILIACS    Past Surgical History:  Procedure Laterality Date  . CARDIAC CATHETERIZATION     WELL  PRESERVED LEFT VENTRICULAR SYSTOLIC FUNCTION. EF 55%  . CORONARY ANGIOPLASTY WITH STENT PLACEMENT     RIGHT CORONARY ARTERY, LEFT ANTERIOR DESCENDING ARTERY  . CORONARY ARTERY BYPASS GRAFT    . iliac artery stenting Bilateral Jan. 7, 2010   Bilateral CIA and Right External IA  stents   . PRE-MALIGNANT / BENIGN SKIN LESION EXCISION Left Oct. 27, 2015   Posterior Neck- Benign mole     Current Outpatient Prescriptions  Medication Sig Dispense Refill  . aspirin 81 MG tablet Take 81 mg by mouth daily.      . carvedilol (COREG) 12.5 MG tablet TAKE ONE TABLET BY MOUTH TWICE DAILY 180 tablet 2  . cetirizine (ZYRTEC) 10 MG tablet Take 10 mg by mouth daily as needed for allergies.     . cilostazol (PLETAL) 100 MG tablet TAKE ONE TABLET BY MOUTH TWICE DAILY 60 tablet 6  . clopidogrel (PLAVIX) 75 MG tablet TAKE ONE TABLET BY MOUTH ONCE DAILY 90 tablet 1  . fenofibrate (TRICOR) 145 MG tablet Take 1 tablet (145 mg total) by mouth daily.  30 tablet 11  . lisinopril (PRINIVIL,ZESTRIL) 10 MG tablet TAKE ONE TABLET BY MOUTH ONCE DAILY 30 tablet 11  . Multiple Vitamin (MULTIVITAMIN) tablet Take 1 tablet by mouth daily.      [name] NITROSTAT 0.4 MG SL tablet DISSOLVE ONE TABLET UNDER THE TONGUE EVERY 5 MINUTES AS NEEDED FOR CHEST PAIN.  DO NOT EXCEED A TOTAL OF 3 DOSES IN 15 MINUTES. NO RELIEF, C 25 tablet 1  . rosuvastatin (CRESTOR) 20 MG tablet TAKE ONE TABLET BY MOUTH ONCE DAILY 30 tablet 11   No current facility-administered medications for this visit.     Allergies:   Ampicillin and Iodinated diagnostic agents    Social History:  The patient  reports that he quit smoking about 7 years ago. His smoking use included Cigarettes. He has never used smokeless tobacco. He reports that he drinks alcohol. He reports that he does not use drugs.   Family History:  The patient's family history includes Cancer in his father and mother; Hyperlipidemia in his mother.    ROS:  Please see the history of present illness.    Review of Systems: Constitutional:  denies fever, chills, diaphoresis, appetite change and fatigue.  HEENT: denies photophobia, eye pain, redness, hearing loss, ear pain, congestion, sore throat, rhinorrhea, sneezing, neck pain, neck stiffness and tinnitus.  Respiratory: denies SOB, DOE, cough, chest tightness, and wheezing.  Cardiovascular: denies chest pain, palpitations and leg swelling.  Gastrointestinal: denies nausea, vomiting, abdominal pain, diarrhea, constipation, blood in stool.  Genitourinary: denies dysuria, urgency, frequency, hematuria, flank pain and difficulty urinating.  Musculoskeletal: denies  myalgias, back pain, joint swelling, arthralgias and gait problem.   Skin: denies pallor, rash and wound.  Neurological: denies dizziness, seizures, syncope, weakness, light-headedness, numbness and headaches.   Hematological: denies adenopathy, easy bruising, personal or family bleeding history.   Psychiatric/ Behavioral: denies suicidal ideation, mood changes, confusion, nervousness, sleep disturbance and agitation.       All other systems are reviewed and negative.    PHYSICAL EXAM: VS:  BP 138/82   Pulse 67   Ht 5\' 7"  (1.702 m)   Wt 188 lb (85.3 kg)   BMI 29.44 kg/m  , BMI Body mass index is 29.44 kg/m. GEN: Well nourished, well developed, in no acute distress  HEENT: normal  Neck: no JVD, carotid bruits, or masses Cardiac: RRR; no murmurs, rubs, or gallops,no edema  Respiratory:  clear to auscultation bilaterally, normal work of breathing GI: soft, nontender, nondistended, + BS MS: no deformity or atrophy  Skin: warm and dry, no rash Neuro:  Strength and sensation are intact Psych: normal   EKG:  EKG is ordered today. The ekg ordered today demonstrates   NSR at 67. NS ST abn.    Recent Labs: 04/06/2016: ALT 15; BUN 15; Creat 0.90; Potassium 4.2; Sodium 137    Lipid Panel    Component Value Date/Time   CHOL 155 04/06/2016 0925   TRIG 114 04/06/2016 0925   HDL 58 04/06/2016 0925   CHOLHDL 2.7 04/06/2016 0925   VLDL 23 04/06/2016 0925   LDLCALC 74 04/06/2016 0925   LDLDIRECT 80.7 10/24/2011 1049      Wt Readings from Last 3 Encounters:  10/10/16 188 lb (85.3 kg)  04/06/16 186 lb (84.4 kg)  03/09/16 191 lb (86.6 kg)      Other studies Reviewed: Additional studies/ records that were reviewed today include: . Review of the above records demonstrates:    ASSESSMENT AND PLAN:  1. Coronary artery disease-status post anterior wall myocardial infarction (Dec. 5, 2009), status post PTCA and stenting of his right coronary artery and later status post PTCA and stenting of his LAD , no angina .[name] Continue current meds.   Will renew as a 90 day supply   2. Hyperlipidemia:   we check fasting labs today. Continue current medications.  3. Hypertension-  - BP readings at home are ok.    4. History of peripheral vascular disease-followed by Dr.  Etta Quill  Current medicines are reviewed at length with the patient today.  The patient does not have concerns regarding medicines.  The following changes have been made:  no change  Labs/ tests ordered today include:  No orders of the defined types were placed in this encounter.    Disposition:   FU with me in 6 month.  Will get fasting labs at that time      [name], MD  10/10/2016 9:09 AM    [place], [place], [place]  [address] Phone: [phone]; Fax: [phone]

## 2016-10-10 NOTE — Patient Instructions (Signed)
Medication Instructions:  Your physician recommends that you continue on your current medications as directed. Please refer to the Current Medication list given to you today.   Labwork: TODAY - cholesterol, complete metabolic panel   Testing/Procedures: None Ordered   Follow-Up: Your physician wants you to follow-up in: 6 months with Dr. Nahser.  You will receive a reminder letter in the mail two months in advance. If you don't receive a letter, please call our office to schedule the follow-up appointment.   If you need a refill on your cardiac medications before your next appointment, please call your pharmacy.   Thank you for choosing [facility]! Swetha Rayle, RN [phone]    

## 2016-10-16 ENCOUNTER — Other Ambulatory Visit: Payer: Self-pay | Admitting: Cardiovascular Disease

## 2016-10-25 ENCOUNTER — Encounter: Payer: Self-pay | Admitting: Family

## 2016-10-30 ENCOUNTER — Encounter: Payer: Self-pay | Admitting: Family

## 2016-10-30 ENCOUNTER — Ambulatory Visit (INDEPENDENT_AMBULATORY_CARE_PROVIDER_SITE_OTHER)
Admission: RE | Admit: 2016-10-30 | Discharge: 2016-10-30 | Disposition: A | Discharge status: Home / Self Care | Payer: Medicare Other | Source: Ambulatory Visit | Attending: Family | Admitting: Family

## 2016-10-30 ENCOUNTER — Ambulatory Visit (INDEPENDENT_AMBULATORY_CARE_PROVIDER_SITE_OTHER): Payer: Medicare Other | Admitting: Family

## 2016-10-30 ENCOUNTER — Ambulatory Visit (HOSPITAL_COMMUNITY)
Admission: RE | Admit: 2016-10-30 | Discharge: 2016-10-30 | Disposition: A | Discharge status: Home / Self Care | Payer: Medicare Other | Source: Ambulatory Visit | Attending: Family | Admitting: Family

## 2016-10-30 VITALS — BP 132/69 | HR 65 | Temp 98.1°F | Wt 191.0 lb

## 2016-10-30 DIAGNOSIS — I739 Peripheral vascular disease, unspecified: Secondary | ICD-10-CM

## 2016-10-30 DIAGNOSIS — I7409 Other arterial embolism and thrombosis of abdominal aorta: Secondary | ICD-10-CM | POA: Diagnosis not present

## 2016-10-30 DIAGNOSIS — Z95828 Presence of other vascular implants and grafts: Secondary | ICD-10-CM | POA: Diagnosis not present

## 2016-10-30 NOTE — Patient Instructions (Addendum)
Peripheral Vascular Disease Peripheral vascular disease (PVD) is a disease of the blood vessels that are not part of your heart and brain. A simple term for PVD is poor circulation. In most cases, PVD narrows the blood vessels that carry blood from your heart to the rest of your body. This can result in a decreased supply of blood to your arms, legs, and internal organs, like your stomach or kidneys. However, it most often affects a person's lower legs and feet. There are two types of PVD.  Organic PVD. This is the more common type. It is caused by damage to the structure of blood vessels.  Functional PVD. This is caused by conditions that make blood vessels contract and tighten (spasm). Without treatment, PVD tends to get worse over time. PVD can also lead to acute ischemic limb. This is when an arm or limb suddenly has trouble getting enough blood. This is a medical emergency. Follow these instructions at home:  Take medicines only as told by your doctor.  Do not use any tobacco products, including cigarettes, chewing tobacco, or electronic cigarettes. If you need help quitting, ask your doctor.  Lose weight if you are overweight, and maintain a healthy weight as told by your doctor.  Eat a diet that is low in fat and cholesterol. If you need help, ask your doctor.  Exercise regularly. Ask your doctor for some good activities for you.  Take good care of your feet.  Wear comfortable shoes that fit well.  Check your feet often for any cuts or sores. Contact a doctor if:  You have cramps in your legs while walking.  You have leg pain when you are at rest.  You have coldness in a leg or foot.  Your skin changes.  You are unable to get or have an erection (erectile dysfunction).  You have cuts or sores on your feet that are not healing. Get help right away if:  Your arm or leg turns cold and blue.  Your arms or legs become red, warm, swollen, painful, or numb.  You have  chest pain or trouble breathing.  You suddenly have weakness in your face, arm, or leg.  You become very confused or you cannot speak.  You suddenly have a very bad headache.  You suddenly cannot see. This information is not intended to replace advice given to you by your health care provider. Make sure you discuss any questions you have with your health care provider. Document Released: 01/31/2010 Document Revised: 04/13/2016 Document Reviewed: 04/16/2014 Elsevier Interactive Patient Education  2017 [employer]     Before your next abdominal ultrasound:  Take two Extra-Strength Gas-X capsules at bedtime the night before the test. Take another two Extra-Strength Gas-X capsules 3 hours before the test.    

## 2016-10-30 NOTE — Progress Notes (Signed)
[facility] [place]   CC: Follow up peripheral artery occlusive disease  History of Present Illness EGBERT BERNALES is a 70 y.o. male patient that Dr. Trula Slade has been following for PAD who is s/p bilateral common iliac artery and right external iliac artery stents placed in 2010. He returns today for routine surveillance.   He suffered an MI in early 2010. This was treated percutaneously. He was found to have poor lower extremity circulation at that time. He has subsequently undergone stenting of his bilateral common iliac arteries as well as his right external iliac artery in 2010. He is also taking cilostazol. He only takes this in the morning. Before the above procedure he could not sleep due to calf pain, since the surgery he can sleep all night. He denies non-healing wounds, states he walks a couple of hundred yards before bilateral calf pain occurs which is resolved with rest, this has improved since the above surgery, denies buttocks pain with walking.  Works out in gym 3-4 days/week. Has 2 cardiac stents, had MI in 2009. Patient denies ever having a stroke or TIA symptoms.  He is taking Pletal 100 mg once daily.   He reports staying on his feet most of the day.    He had basal cell cancer removed, but has recurred, left side of face and nose.   Pt Diabetic: No Pt smoker: former smoker, quit in 2009   Pt meds include: Statin :Yes Betablocker: Yes ASA: Yes Other anticoagulants/antiplatelets: Plavix    Past Medical History:  Diagnosis Date  . AAA (abdominal aortic aneurysm) ([place])   . Cancer ([place]) 2005   BCC  . Cancer of skin, face Dec. 2013  and  Jan.  2014   [facility]  Left cheek and nose;  2016  Left Periorbital  . Coronary artery disease   . Hyperlipidemia   . Hypertension   . Leg pain    with walking  . MI, old    INFERIOR WALL M.I.  . Myocardial infarction 10/24/2008  . PVD (peripheral vascular disease) ([place])    MODERATE TO SEVERE,  INVOLVING THE DISTAL AORTA AND BILATERAL ILIACS    Social History Social History  Substance Use Topics  . Smoking status: Former Smoker    Types: Cigarettes    Quit date: 10/20/2008  . Smokeless tobacco: Never Used  . Alcohol use 0.0 oz/week     Comment: states drinking 2-3 beers a day    Family History Family History  Problem Relation Age of Onset  . Cancer Mother   . Hyperlipidemia Mother   . Cancer Father     Past Surgical History:  Procedure Laterality Date  . CARDIAC CATHETERIZATION     WELL PRESERVED LEFT VENTRICULAR SYSTOLIC FUNCTION. EF 55%  . CORONARY ANGIOPLASTY WITH STENT PLACEMENT     RIGHT CORONARY ARTERY, LEFT ANTERIOR DESCENDING ARTERY  . CORONARY ARTERY BYPASS GRAFT    . iliac artery stenting Bilateral Jan. 7, 2010   Bilateral CIA and Right External IA  stents   . PRE-MALIGNANT / BENIGN SKIN LESION EXCISION Left Oct. 27, 2015   Posterior Neck- Benign mole    Allergies  Allergen Reactions  . Ampicillin     Developed prolonged diarrhea - may have developed C. Diff colitis.    . Iodinated Diagnostic Agents Itching and Rash    Current Outpatient Prescriptions  Medication Sig Dispense Refill  . aspirin 81 MG tablet Take 81 mg by mouth daily.      [name]  carvedilol (COREG) 12.5 MG tablet TAKE ONE TABLET BY MOUTH TWICE DAILY 180 tablet 3  . cetirizine (ZYRTEC) 10 MG tablet Take 10 mg by mouth daily as needed for allergies.     . cilostazol (PLETAL) 100 MG tablet TAKE ONE TABLET BY MOUTH TWICE DAILY 60 tablet 6  . clopidogrel (PLAVIX) 75 MG tablet TAKE ONE TABLET BY MOUTH ONCE DAILY 90 tablet 1  . fenofibrate (TRICOR) 145 MG tablet Take 1 tablet (145 mg total) by mouth daily. 30 tablet 11  . lisinopril (PRINIVIL,ZESTRIL) 10 MG tablet TAKE ONE TABLET BY MOUTH ONCE DAILY 30 tablet 11  . Multiple Vitamin (MULTIVITAMIN) tablet Take 1 tablet by mouth daily.      [name] NITROSTAT 0.4 MG SL tablet DISSOLVE ONE TABLET UNDER THE TONGUE EVERY 5 MINUTES AS NEEDED FOR CHEST PAIN.   DO NOT EXCEED A TOTAL OF 3 DOSES IN 15 MINUTES. NO RELIEF, C 25 tablet 1  . rosuvastatin (CRESTOR) 20 MG tablet TAKE ONE TABLET BY MOUTH ONCE DAILY 30 tablet 11   No current facility-administered medications for this visit.     ROS: See HPI for pertinent positives and negatives.   Physical Examination  Vitals:   10/30/16 0905  BP: 132/69  Pulse: 65  Temp: 98.1 F (36.7 C)  SpO2: 99%  Weight: 191 lb (86.6 kg)   Body mass index is 29.91 kg/m.  General: A&O x 3, WDWN,  Gait: normal Eyes: PERRLA, Pulmonary: Respirations are non labored, CTAB, without wheezes , rales or rhonchi Cardiac: regular rhythm, no detected murmur     Carotid Bruits Left Right   Negative Negative  Aorta: palpable Radial pulses: 2+ and=   VASCULAR EXAM: Extremities without ischemic changes  without Gangrene; without open wounds.     LE Pulses LEFT RIGHT   FEMORAL not palpable not palpable    POPLITEAL not palpable  not palpable   POSTERIOR TIBIAL not palpable  not palpable    DORSALIS PEDIS  ANTERIOR TIBIAL not palpable  not palpable    Abdomen: soft, NT, no masses palpated. Skin: no rashes, no ulcers. Musculoskeletal: no muscle wasting or atrophy. Neurologic: A&O X 3; appropriate affect, normal sensation; MOTOR FUNCTION: moving all extremities equally, motor strength 5/5 throughout. Speech is fluent/normal.  CN 2-12 intact.   ASSESSMENT: VIYAN DEMEO is a 70 y.o. male who is s/p bilateral common iliac artery and right external iliac artery stents placed 2010.  He has claudication in both calves after walking a couple hundred yards, no change from 2 years ago. He has no signs of ischemia in his feet/legs. Fortunately he has not used  tobacco since 2009 and does not have DM. He takes cilostazol, ASA, Plavix, and a statin.   DATA ABI's and TBI's remain stable over the last year with severe arterial occlusive disease in the right LE (0.44, DP), no PT waveform detected,  and moderate in the left LE (0.63).  Aortoiliac duplex today suggests decreased visualization of the abdominal vasculature or stents due to overlying bowel gas. Incidental finding of 2.73 cm x 2.79 cm distal abdominal aorta diameter.  Patent bilateral CIA and right external iliac artery stents with greater than 50% left proximal external iliac artery stenosis (313 cm/s today, compared to 269 cm/s on 10-18-15).  No significant change from last exam on11/28/16.   PLAN:  Continued graduated walking program. Based on the patient's vascular studies and examination, and after discussing with Dr. Trula Slade, pt will return to clinic in 6 months for  ABI's and bilateral aortoiliac Duplex. He knows to return sooner should he develop concerns re the circulation in his legs. Dr. Trula Slade stated that if the stenosis in the left EIA is worse in 6 months, will likely schedule an arteriogram.    I discussed in depth with the patient the nature of atherosclerosis, and emphasized the importance of maximal medical management including strict control of blood pressure, blood glucose, and lipid levels, obtaining regular exercise, and continued cessation of smoking.  The patient is aware that without maximal medical management the underlying atherosclerotic disease process will progress, limiting the benefit of any interventions.  The patient was given information about PAD including signs, symptoms, treatment, what symptoms should prompt the patient to seek immediate medical care, and risk reduction measures to take.  Clemon Chambers, RN, MSN, FNP-C [facility] Phone: [phone]  Clinic MD: Trula Slade  10/30/16 9:18 AM

## 2017-01-15 ENCOUNTER — Emergency Department (HOSPITAL_COMMUNITY)
Admission: EM | Admit: 2017-01-15 | Discharge: 2017-01-15 | Disposition: A | Discharge status: Home / Self Care | Payer: Medicare Other | Attending: Emergency Medicine | Admitting: Emergency Medicine

## 2017-01-15 ENCOUNTER — Emergency Department (HOSPITAL_COMMUNITY): Payer: Medicare Other

## 2017-01-15 ENCOUNTER — Encounter (HOSPITAL_COMMUNITY): Payer: Self-pay | Admitting: *Deleted

## 2017-01-15 DIAGNOSIS — Z951 Presence of aortocoronary bypass graft: Secondary | ICD-10-CM | POA: Insufficient documentation

## 2017-01-15 DIAGNOSIS — I2510 Atherosclerotic heart disease of native coronary artery without angina pectoris: Secondary | ICD-10-CM | POA: Insufficient documentation

## 2017-01-15 DIAGNOSIS — Z79899 Other long term (current) drug therapy: Secondary | ICD-10-CM | POA: Insufficient documentation

## 2017-01-15 DIAGNOSIS — R42 Dizziness and giddiness: Secondary | ICD-10-CM | POA: Insufficient documentation

## 2017-01-15 DIAGNOSIS — Z87891 Personal history of nicotine dependence: Secondary | ICD-10-CM | POA: Insufficient documentation

## 2017-01-15 DIAGNOSIS — Z7982 Long term (current) use of aspirin: Secondary | ICD-10-CM | POA: Diagnosis not present

## 2017-01-15 DIAGNOSIS — I10 Essential (primary) hypertension: Secondary | ICD-10-CM | POA: Diagnosis not present

## 2017-01-15 DIAGNOSIS — H81399 Other peripheral vertigo, unspecified ear: Secondary | ICD-10-CM

## 2017-01-15 LAB — CBC WITH DIFFERENTIAL/PLATELET
BASOS ABS: 0 10*3/uL (ref 0.0–0.1)
Basophils Relative: 0 %
EOS PCT: 2 %
Eosinophils Absolute: 0.2 10*3/uL (ref 0.0–0.7)
HCT: 42.5 % (ref 39.0–52.0)
HEMOGLOBIN: 14.3 g/dL (ref 13.0–17.0)
LYMPHS ABS: 1.8 10*3/uL (ref 0.7–4.0)
LYMPHS PCT: 23 %
MCH: 30.6 pg (ref 26.0–34.0)
MCHC: 33.6 g/dL (ref 30.0–36.0)
MCV: 90.8 fL (ref 78.0–100.0)
Monocytes Absolute: 0.6 10*3/uL (ref 0.1–1.0)
Monocytes Relative: 7 %
NEUTROS ABS: 5.5 10*3/uL (ref 1.7–7.7)
NEUTROS PCT: 68 %
PLATELETS: 327 10*3/uL (ref 150–400)
RBC: 4.68 MIL/uL (ref 4.22–5.81)
RDW: 13.4 % (ref 11.5–15.5)
WBC: 8.2 10*3/uL (ref 4.0–10.5)

## 2017-01-15 LAB — BASIC METABOLIC PANEL
ANION GAP: 8 (ref 5–15)
BUN: 14 mg/dL (ref 6–20)
CHLORIDE: 108 mmol/L (ref 101–111)
CO2: 23 mmol/L (ref 22–32)
Calcium: 9.1 mg/dL (ref 8.9–10.3)
Creatinine, Ser: 0.8 mg/dL (ref 0.61–1.24)
GFR calc non Af Amer: >60 mL/min (ref >60)
Glucose, Bld: 113 mg/dL — ABNORMAL HIGH (ref 65–99)
POTASSIUM: 3.9 mmol/L (ref 3.5–5.1)
SODIUM: 139 mmol/L (ref 135–145)

## 2017-01-15 LAB — TROPONIN I: Troponin I: <0.03 ng/mL (ref <0.03)

## 2017-01-15 MED ORDER — MECLIZINE HCL 12.5 MG PO TABS
25.0000 mg | ORAL_TABLET | Freq: Once | ORAL | Status: AC
  Administered 2017-01-15: 25 mg via ORAL
  Filled 2017-01-15: qty 2

## 2017-01-15 MED ORDER — MECLIZINE HCL 25 MG PO TABS: 25.0000 mg | ORAL_TABLET | Freq: Three times a day (TID) | ORAL | 0 refills | Status: DC | PRN

## 2017-01-15 NOTE — ED Notes (Signed)
Lab at bedside

## 2017-01-15 NOTE — ED Notes (Signed)
ED Provider at bedside. 

## 2017-01-15 NOTE — ED Provider Notes (Signed)
[facility] DEPT Provider Note   CSN: [id number] Arrival date & time: 01/15/17  0910     History   Chief Complaint Chief Complaint  Patient presents with  . Dizziness    HPI Donald Mckinney is a 71 y.o. male.  HPI  Pt was seen at 0920. Per EMS and pt report, c/o sudden onset and persistence of constant "dizziness" that began yesterday morning when he woke up. Pt states that episode lasted a short period of time before resolving and then returning last night approximately 2230. Describes the dizziness as "everything is spinning around." Symptoms have been constant since last night. Symptoms worsen with turning his head or eyes side-to-side. Has been associated with no other symptoms. Denies fevers, no neck pain, no headache, no N/V/D, no CP/palpitations, no SOB/cough, no abd pain, no visual changes, no facial droop, no slurred speech, no focal motor weakness, no tingling/numbness in extremities, no ataxia.   Past Medical History:  Diagnosis Date  . AAA (abdominal aortic aneurysm) ([place])   . Cancer ([place]) 2005   BCC  . Cancer of skin, face Dec. 2013  and  Jan.  2014   [facility]  Left cheek and nose;  2016  Left Periorbital  . Coronary artery disease   . Hyperlipidemia   . Hypertension   . Leg pain    with walking  . MI, old    INFERIOR WALL M.I.  . Myocardial infarction 10/24/2008  . PVD (peripheral vascular disease) ([place])    MODERATE TO SEVERE, INVOLVING THE DISTAL AORTA AND BILATERAL ILIACS    Patient Active Problem List   Diagnosis Date Noted  . Aftercare following surgery of the circulatory system, [place] 10/06/2013  . Pain in limb 10/07/2012  . Peripheral vascular disease, unspecified 10/07/2012  . Peripheral vascular disease ([place]) 10/24/2011  . CAD (coronary artery disease) 04/27/2011  . HTN (hypertension) 04/27/2011  . Hyperlipidemia 04/27/2011    Past Surgical History:  Procedure Laterality Date  . CARDIAC CATHETERIZATION     WELL PRESERVED LEFT VENTRICULAR SYSTOLIC  FUNCTION. EF 55%  . CORONARY ANGIOPLASTY WITH STENT PLACEMENT     RIGHT CORONARY ARTERY, LEFT ANTERIOR DESCENDING ARTERY  . CORONARY ARTERY BYPASS GRAFT    . iliac artery stenting Bilateral Jan. 7, 2010   Bilateral CIA and Right External IA  stents   . PRE-MALIGNANT / BENIGN SKIN LESION EXCISION Left Oct. 27, 2015   Posterior Neck- Benign mole       Home Medications    Prior to Admission medications   Medication Sig Start Date End Date Taking? Authorizing Provider  aspirin 81 MG tablet Take 81 mg by mouth daily.     Yes Historical Provider, MD  carvedilol (COREG) 12.5 MG tablet TAKE ONE TABLET BY MOUTH TWICE DAILY 10/17/16  Yes Thayer Headings, MD  cilostazol (PLETAL) 100 MG tablet TAKE ONE TABLET BY MOUTH TWICE DAILY 09/04/16  Yes Serafina Mitchell, MD  clopidogrel (PLAVIX) 75 MG tablet TAKE ONE TABLET BY MOUTH ONCE DAILY 09/22/16  Yes Thayer Headings, MD  fenofibrate (TRICOR) 145 MG tablet Take 1 tablet (145 mg total) by mouth daily. 05/09/16  Yes Thayer Headings, MD  lisinopril (PRINIVIL,ZESTRIL) 10 MG tablet TAKE ONE TABLET BY MOUTH ONCE DAILY 04/20/16  Yes Thayer Headings, MD  loratadine (CLARITIN) 10 MG tablet Take 10 mg by mouth daily.   Yes Historical Provider, MD  Multiple Vitamin (MULTIVITAMIN) tablet Take 1 tablet by mouth daily.     Yes Historical  Provider, MD  NITROSTAT 0.4 MG SL tablet DISSOLVE ONE TABLET UNDER THE TONGUE EVERY 5 MINUTES AS NEEDED FOR CHEST PAIN.  DO NOT EXCEED A TOTAL OF 3 DOSES IN 15 MINUTES. NO RELIEF, C 03/07/16  Yes Thayer Headings, MD  rosuvastatin (CRESTOR) 20 MG tablet TAKE ONE TABLET BY MOUTH ONCE DAILY 03/08/16  Yes Thayer Headings, MD    Family History Family History  Problem Relation Age of Onset  . Cancer Mother   . Hyperlipidemia Mother   . Cancer Father     Social History Social History  Substance Use Topics  . Smoking status: Former Smoker    Types: Cigarettes    Quit date: 10/20/2008  . Smokeless tobacco: Never Used  . Alcohol use 0.0  oz/week     Comment: states drinking 2-3 beers a day     Allergies   Ampicillin and Iodinated diagnostic agents   Review of Systems Review of Systems ROS: Statement: All systems negative except as marked or noted in the HPI; Constitutional: Negative for fever and chills. ; ; Eyes: Negative for eye pain, redness and discharge. ; ; ENMT: Negative for ear pain, hoarseness, nasal congestion, sinus pressure and sore throat. ; ; Cardiovascular: Negative for chest pain, palpitations, diaphoresis, dyspnea and peripheral edema. ; ; Respiratory: Negative for cough, wheezing and stridor. ; ; Gastrointestinal: Negative for nausea, vomiting, diarrhea, abdominal pain, blood in stool, hematemesis, jaundice and rectal bleeding. . ; ; Genitourinary: Negative for dysuria, flank pain and hematuria. ; ; Musculoskeletal: Negative for back pain and neck pain. Negative for swelling and trauma.; ; Skin: Negative for pruritus, rash, abrasions, blisters, bruising and skin lesion.; ; Neuro: +"dizziness." Negative for headache, lightheadedness and neck stiffness. Negative for weakness, altered level of consciousness, altered mental status, extremity weakness, paresthesias, involuntary movement, seizure and syncope.       Physical Exam Updated Vital Signs BP 181/98 (BP Location: Right Arm)   Pulse 80   Temp 98.5 F (36.9 C)   Resp 18   Ht 5\' 7"  (1.702 m)   Wt 186 lb (84.4 kg)   SpO2 98%   BMI 29.13 kg/m   09:16:45 Orthostatic Vital Signs LR  Orthostatic Lying   BP- Lying:  181/98  Pulse- Lying: 80      Orthostatic Sitting  BP- Sitting:  180/99  Pulse- Sitting: 83      Orthostatic Standing at 0 minutes  BP- Standing at 0 minutes:  188/96  Pulse- Standing at 0 minutes: 82    Physical Exam 0925: Physical examination:  Nursing notes reviewed; Vital signs and O2 SAT reviewed;  Constitutional: Well developed, Well nourished, Well hydrated, In no acute distress; Head:  Normocephalic, atraumatic; Eyes:  EOMI, PERRL, No scleral icterus; ENMT: Mouth and pharynx normal, Mucous membranes moist; Neck: Supple, Full range of motion, No lymphadenopathy; Cardiovascular: Regular rate and rhythm, No gallop; Respiratory: Breath sounds clear & equal bilaterally, No wheezes.  Speaking full sentences with ease, Normal respiratory effort/excursion; Chest: Nontender, Movement normal; Abdomen: Soft, Nontender, Nondistended, Normal bowel sounds; Genitourinary: No CVA tenderness; Extremities: Pulses normal, No tenderness, No edema, No calf edema or asymmetry.; Neuro: AA&Ox3,  Major CN grossly intact. Speech clear.  No facial droop.  +left horizontal end gaze fatigable nystagmus. Grips equal. Strength 5/5 equal bilat UE's and LE's.  DTR 2/4 equal bilat UE's and LE's.  No gross sensory deficits.  Normal cerebellar testing bilat UE's (finger-nose) and LE's (heel-shin)..; Skin: Color normal, Warm, Dry.   ED Treatments /  Results  Labs (all labs ordered are listed, but only abnormal results are displayed)   EKG  EKG Interpretation  Date/Time:  Monday January 15 2017 09:14:27 EST Ventricular Rate:  81 PR Interval:    QRS Duration: 103 QT Interval:  394 QTC Calculation: 458 R Axis:   70 Text Interpretation:  Sinus rhythm Low voltage, precordial leads Minimal ST depression, anterior leads When compared with ECG of 10/04/2015 No significant change was found Confirmed by [facility]  MD, Hajira Verhagen ([id number]) on 01/15/2017 9:32:52 AM       EKG Interpretation  Date/Time:  Monday January 15 2017 12:32:51 EST Ventricular Rate:  70 PR Interval:    QRS Duration: 114 QT Interval:  412 QTC Calculation: 445 R Axis:   56 Text Interpretation:  Sinus rhythm Borderline intraventricular conduction delay Low voltage, precordial leads Since last tracing of earlier today No significant change was found Confirmed by [facility]  MD, Nunzio Cory [phone]) on 01/15/2017 12:56:01 PM        Radiology   Procedures Procedures (including critical  care time)  Medications Ordered in ED Medications  meclizine (ANTIVERT) tablet 25 mg (25 mg Oral Given 01/15/17 0950)     Initial Impression / Assessment and Plan / ED Course  I have reviewed the triage vital signs and the nursing notes.  Pertinent labs & imaging results that were available during my care of the patient were reviewed by me and considered in my medical decision making (see chart for details).  MDM Reviewed: previous chart, nursing note and vitals Reviewed previous: labs and ECG Interpretation: labs, ECG and MRI    Results for orders placed or performed during the hospital encounter of [id number]  Basic metabolic panel  Result Value Ref Range   Sodium 139 135 - 145 mmol/L   Potassium 3.9 3.5 - 5.1 mmol/L   Chloride 108 101 - 111 mmol/L   CO2 23 22 - 32 mmol/L   Glucose, Bld 113 (H) 65 - 99 mg/dL   BUN 14 6 - 20 mg/dL   Creatinine, Ser 0.80 0.61 - 1.24 mg/dL   Calcium 9.1 8.9 - 10.3 mg/dL   GFR calc non Af Amer >60 >60 mL/min   GFR calc Af Amer >60 >60 mL/min   Anion gap 8 5 - 15  Troponin I  Result Value Ref Range   Troponin I <0.03 <0.03 ng/mL  CBC with Differential  Result Value Ref Range   WBC 8.2 4.0 - 10.5 K/uL   RBC 4.68 4.22 - 5.81 MIL/uL   Hemoglobin 14.3 13.0 - 17.0 g/dL   HCT 42.5 39.0 - 52.0 %   MCV 90.8 78.0 - 100.0 fL   MCH 30.6 26.0 - 34.0 pg   MCHC 33.6 30.0 - 36.0 g/dL   RDW 13.4 11.5 - 15.5 %   Platelets 327 150 - 400 K/uL   Neutrophils Relative % 68 %   Neutro Abs 5.5 1.7 - 7.7 K/uL   Lymphocytes Relative 23 %   Lymphs Abs 1.8 0.7 - 4.0 K/uL   Monocytes Relative 7 %   Monocytes Absolute 0.6 0.1 - 1.0 K/uL   Eosinophils Relative 2 %   Eosinophils Absolute 0.2 0.0 - 0.7 K/uL   Basophils Relative 0 %   Basophils Absolute 0.0 0.0 - 0.1 K/uL  Troponin I  Result Value Ref Range   Troponin I <0.03 <0.03 ng/mL   Mr Brain Wo Contrast (neuro Protocol) Result Date: 01/15/2017 CLINICAL DATA:  Dizziness and weakness for 1 day. EXAM:  MRI  HEAD WITHOUT CONTRAST TECHNIQUE: Multiplanar, multiecho pulse sequences of the brain and surrounding structures were obtained without intravenous contrast. COMPARISON:  None. FINDINGS: Brain: There is a punctate focus of slightly increased signal in the right paramedian midbrain on the axial trace diffusion series without restricted diffusion confirmed on the coronal series, favored to reflect artifact. There is no evidence of acute infarct elsewhere, with special attention to the cerebellum given patient's dizziness. There is mild cerebral atrophy. No significant cerebral white matter disease is seen for age. There is no evidence of intracranial hemorrhage, mass, midline shift, or extra-axial fluid collection. Incidental partially empty sella. Vascular: Major intracranial vascular flow voids are preserved. Skull and upper cervical spine: Unremarkable bone marrow signal. Sinuses/Orbits: Unremarkable orbits. Paranasal sinuses and mastoid air cells are clear. Other: None. IMPRESSION: 1. No acute intracranial abnormality identified. Punctate focus of diffusion signal in the right midbrain favored to reflect artifact rather than acute infarct. 2. Mild cerebral atrophy. Electronically Signed   By: Logan Bores M.D.   On: 01/15/2017 12:13    1335:  Pt was "concerned about my heart." EKG and troponin x2 unchanged: pt reassured. Pt given antivert with improvement in symptoms. Ambulated with steady gait, easy resps, NAD. Has tol PO well while in the ED without N/V. Not orthostatic during VS. Pt states he wants to go home now. Tx symptomatically at this time. Dx and testing d/w pt and family.  Questions answered.  Verb understanding, agreeable to d/c home with outpt f/u.    Final Clinical Impressions(s) / ED Diagnoses   Final diagnoses:  None    New Prescriptions New Prescriptions   No medications on file     Francine Graven, DO 01/21/17 1530

## 2017-01-15 NOTE — Discharge Instructions (Signed)
Take the prescription as directed.  Call your regular medical doctor today to schedule a follow up appointment within the next 2 days. Call the Neurologist today to schedule a follow up appointment within the next week.  Return to the Emergency Department immediately sooner if worsening.

## 2017-01-15 NOTE — ED Triage Notes (Signed)
Pt comes in by EMS from home for dizziness when he moves. This started yesterday morning but got worse around 1030 last night. Pt is alert and oriented upon arrival.   First BP was 188/102 by EMS, Second was 154/82.

## 2017-01-15 NOTE — ED Notes (Signed)
Pt ambulated in hall. VSS. Tolerated well.

## 2017-01-17 ENCOUNTER — Encounter: Payer: Self-pay | Admitting: Family

## 2017-03-20 ENCOUNTER — Other Ambulatory Visit: Payer: Self-pay | Admitting: Cardiovascular Disease

## 2017-05-08 ENCOUNTER — Other Ambulatory Visit: Payer: Self-pay | Admitting: Cardiovascular Disease

## 2017-05-21 ENCOUNTER — Encounter: Payer: Self-pay | Admitting: Family

## 2017-05-28 ENCOUNTER — Ambulatory Visit (HOSPITAL_COMMUNITY)
Admission: RE | Admit: 2017-05-28 | Discharge: 2017-05-28 | Disposition: A | Discharge status: Home / Self Care | Payer: Medicare Other | Source: Ambulatory Visit | Attending: Family | Admitting: Family

## 2017-05-28 ENCOUNTER — Ambulatory Visit (INDEPENDENT_AMBULATORY_CARE_PROVIDER_SITE_OTHER): Payer: Medicare Other | Admitting: Family

## 2017-05-28 ENCOUNTER — Encounter: Payer: Self-pay | Admitting: Family

## 2017-05-28 ENCOUNTER — Ambulatory Visit (INDEPENDENT_AMBULATORY_CARE_PROVIDER_SITE_OTHER)
Admission: RE | Admit: 2017-05-28 | Discharge: 2017-05-28 | Disposition: A | Discharge status: Home / Self Care | Payer: Medicare Other | Source: Ambulatory Visit | Attending: Family | Admitting: Family

## 2017-05-28 VITALS — BP 162/90 | HR 61 | Temp 97.1°F | Resp 20 | Ht 67.0 in | Wt 196.0 lb

## 2017-05-28 DIAGNOSIS — Z87891 Personal history of nicotine dependence: Secondary | ICD-10-CM | POA: Diagnosis not present

## 2017-05-28 DIAGNOSIS — Z95828 Presence of other vascular implants and grafts: Secondary | ICD-10-CM

## 2017-05-28 DIAGNOSIS — I7409 Other arterial embolism and thrombosis of abdominal aorta: Secondary | ICD-10-CM | POA: Diagnosis not present

## 2017-05-28 DIAGNOSIS — R0989 Other specified symptoms and signs involving the circulatory and respiratory systems: Secondary | ICD-10-CM | POA: Diagnosis present

## 2017-05-28 NOTE — Patient Instructions (Addendum)
Before your next abdominal ultrasound:  Take two Extra-Strength Gas-X capsules at bedtime the night before the test. Take another two Extra-Strength Gas-X capsules 3 hours before the test.  Avoid gas forming foods the day before the test.       Peripheral Vascular Disease Peripheral vascular disease (PVD) is a disease of the blood vessels that are not part of your heart and brain. A simple term for PVD is poor circulation. In most cases, PVD narrows the blood vessels that carry blood from your heart to the rest of your body. This can result in a decreased supply of blood to your arms, legs, and internal organs, like your stomach or kidneys. However, it most often affects a person's lower legs and feet. There are two types of PVD.  Organic PVD. This is the more common type. It is caused by damage to the structure of blood vessels.  Functional PVD. This is caused by conditions that make blood vessels contract and tighten (spasm).  Without treatment, PVD tends to get worse over time. PVD can also lead to acute ischemic limb. This is when an arm or limb suddenly has trouble getting enough blood. This is a medical emergency. Follow these instructions at home:  Take medicines only as told by your doctor.  Do not use any tobacco products, including cigarettes, chewing tobacco, or electronic cigarettes. If you need help quitting, ask your doctor.  Lose weight if you are overweight, and maintain a healthy weight as told by your doctor.  Eat a diet that is low in fat and cholesterol. If you need help, ask your doctor.  Exercise regularly. Ask your doctor for some good activities for you.  Take good care of your feet. ? Wear comfortable shoes that fit well. ? Check your feet often for any cuts or sores. Contact a doctor if:  You have cramps in your legs while walking.  You have leg pain when you are at rest.  You have coldness in a leg or foot.  Your skin changes.  You are unable to  get or have an erection (erectile dysfunction).  You have cuts or sores on your feet that are not healing. Get help right away if:  Your arm or leg turns cold and blue.  Your arms or legs become red, warm, swollen, painful, or numb.  You have chest pain or trouble breathing.  You suddenly have weakness in your face, arm, or leg.  You become very confused or you cannot speak.  You suddenly have a very bad headache.  You suddenly cannot see. This information is not intended to replace advice given to you by your health care provider. Make sure you discuss any questions you have with your health care provider. Document Released: 01/31/2010 Document Revised: 04/13/2016 Document Reviewed: 04/16/2014 Elsevier Interactive Patient Education  2017 [employer]  

## 2017-05-28 NOTE — Progress Notes (Signed)
[facility] [place]   CC: Follow up peripheral artery occlusive disease  History of Present Illness Donald Mckinney is a 71 y.o. male patient that Dr. Trula Slade has been following for PAD who is s/p bilateral common iliac artery and right external iliac artery stents placed in 2010. He returns today for routine surveillance.   He suffered an MI in early 2010. This was treated percutaneously. He was found to have poor lower extremity circulation at that time. He has subsequently undergone stenting of his bilateral common iliac arteries as well as his right external iliac artery in 2010. He is also taking cilostazol. He only takes this in the morning. Before the above procedure he could not sleep due to calf pain, since the surgery he can sleep all night.  Works out in gym 3-4 days/week. Has 2 cardiac stents, had MI in 2009. Patient denies ever having a stroke or TIA symptoms.  He walks about 100 yards before both calves start to cramp, relieved by rest. He walks about a total of 1/2 mile daily.   He is taking Pletal 100 mg once daily.   He reports staying on his feet most of the day.    He had basal cell cancer removed, but has recurred, left side of face and nose.   Pt Diabetic: No Pt smoker: former smoker, quit in 2009   Pt meds include: Statin :Yes Betablocker: Yes ASA: Yes Other anticoagulants/antiplatelets: Plavix    Past Medical History:  Diagnosis Date  . AAA (abdominal aortic aneurysm) ([place])   . Cancer ([place]) 2005   BCC  . Cancer of skin, face Dec. 2013  and  Jan.  2014   [facility]  Left cheek and nose;  2016  Left Periorbital  . Coronary artery disease   . Hyperlipidemia   . Hypertension   . Leg pain    with walking  . MI, old    INFERIOR WALL M.I.  . Myocardial infarction ([place]) 10/24/2008  . PVD (peripheral vascular disease) ([place])    MODERATE TO SEVERE, INVOLVING THE DISTAL AORTA AND BILATERAL ILIACS    Social History Social History   Substance Use Topics  . Smoking status: Former Smoker    Types: Cigarettes    Quit date: 10/20/2008  . Smokeless tobacco: Never Used  . Alcohol use 0.0 oz/week     Comment: states drinking 2-3 beers a day    Family History Family History  Problem Relation Age of Onset  . Cancer Mother   . Hyperlipidemia Mother   . Cancer Father     Past Surgical History:  Procedure Laterality Date  . CARDIAC CATHETERIZATION     WELL PRESERVED LEFT VENTRICULAR SYSTOLIC FUNCTION. EF 55%  . CORONARY ANGIOPLASTY WITH STENT PLACEMENT     RIGHT CORONARY ARTERY, LEFT ANTERIOR DESCENDING ARTERY  . CORONARY ARTERY BYPASS GRAFT    . iliac artery stenting Bilateral Jan. 7, 2010   Bilateral CIA and Right External IA  stents   . PRE-MALIGNANT / BENIGN SKIN LESION EXCISION Left Oct. 27, 2015   Posterior Neck- Benign mole    Allergies  Allergen Reactions  . Ampicillin     Developed prolonged diarrhea - may have developed C. Diff colitis.    . Iodinated Diagnostic Agents Itching and Rash    Current Outpatient Prescriptions  Medication Sig Dispense Refill  . aspirin 81 MG tablet Take 81 mg by mouth daily.      . carvedilol (COREG) 12.5 MG tablet  TAKE ONE TABLET BY MOUTH TWICE DAILY 180 tablet 3  . cilostazol (PLETAL) 100 MG tablet TAKE ONE TABLET BY MOUTH TWICE DAILY 60 tablet 6  . clopidogrel (PLAVIX) 75 MG tablet TAKE ONE TABLET BY MOUTH ONCE DAILY 90 tablet 1  . fenofibrate (TRICOR) 145 MG tablet TAKE ONE TABLET BY MOUTH ONCE DAILY 30 tablet 4  . lisinopril (PRINIVIL,ZESTRIL) 10 MG tablet TAKE ONE TABLET BY MOUTH ONCE DAILY 30 tablet 11  . loratadine (CLARITIN) 10 MG tablet Take 10 mg by mouth daily.    . meclizine (ANTIVERT) 25 MG tablet Take 1 tablet (25 mg total) by mouth 3 (three) times daily as needed for dizziness. 15 tablet 0  . Multiple Vitamin (MULTIVITAMIN) tablet Take 1 tablet by mouth daily.      [name] NITROSTAT 0.4 MG SL tablet DISSOLVE ONE TABLET UNDER THE TONGUE EVERY 5 MINUTES AS NEEDED  FOR CHEST PAIN.  DO NOT EXCEED A TOTAL OF 3 DOSES IN 15 MINUTES. NO RELIEF, C 25 tablet 1  . rosuvastatin (CRESTOR) 20 MG tablet TAKE ONE TABLET BY MOUTH ONCE DAILY 90 tablet 1   No current facility-administered medications for this visit.     ROS: See HPI for pertinent positives and negatives.   Physical Examination  Vitals:   05/28/17 0856 05/28/17 0859  BP: (!) 164/89 (!) 162/90  Pulse: 61   Resp: 20   Temp: (!) 97.1 F (36.2 C)   TempSrc: Oral   SpO2: 98%   Weight: 196 lb (88.9 kg)   Height: 5\' 7"  (1.702 m)    Body mass index is 30.7 kg/m.  General: A&O x 3, WDWN,  Gait: normal Eyes: PERRLA, Pulmonary: Respirations are non labored, CTAB, without wheezes , rales or rhonchi Cardiac: regular rhythm, no detected murmur     Carotid Bruits Left Right   Negative Negative  Aorta: palpable Radial pulses: 2+ and=   VASCULAR EXAM: Extremitieswithout ischemic changes  without Gangrene; without open wounds.     LE Pulses LEFT RIGHT   FEMORAL not palpable not palpable    POPLITEAL not palpable  not palpable   POSTERIOR TIBIAL not palpable  not palpable    DORSALIS PEDIS  ANTERIOR TIBIAL not palpable  not palpable    Abdomen: soft, NT, no masses palpated. Skin: no rashes, no ulcers. Musculoskeletal: no muscle wasting or atrophy. Neurologic: A&O X 3; appropriate affect, normal sensation; MOTOR FUNCTION: moving all extremities equally, motor strength 5/5 throughout. Speech is fluent/normal. CN 2-12 intact.     ASSESSMENT: Donald Mckinney is a 71 y.o. male who is s/p bilateral common iliac artery and right external iliac artery stents placed 2010.  He has claudication in both calves after walking 100 yards, no  change from 2 years ago. He has no signs of ischemia in his feet/legs. Fortunately he has not used tobacco since 2009 and does not have DM. He takes cilostazol, ASA, Plavix, and a statin.   DATA  Aortoiliac Duplex (05/28/17): Decreased visualization of the abdominal vasculature or stents due to overlying bowel gas. Incidental finding of 2.73 cm x 2.79 cm distal abdominal aorta diameter (10-30-16). Patent bilateral CIA and right external iliac artery stents (where visualized) with greater than 50% left proximal external iliac artery stenosis (332 cm/s today, compared to 313 cm/s on 10-30-16). No significant change from last exam on 10-30-16.    ABI (Date: 05/28/2017)  R:   ABI: 0.46 (was 0.44 on 10-30-16),   PT: mono  DP: mono  TBI:  0.34 (was dampened)  L:   ABI: 0.67 (was 0.63),   PT: mono  DP: mono  TBI: 0.42 (was 0.44) ABI's and TBI's remain stable over the last six months with severe arterial occlusive disease in the right LE,  and moderate in the left LE.  PLAN:  Continued graduated walking program. Based on the patient's vascular studies and examination, pt will return to clinic in 4 months for ABI's and bilateral aortoiliac Duplex, follow up with Dr. Trula Slade. He knows to return sooner should he develop concerns re the circulation in his legs. Dr. Trula Slade stated at pt's visit on 10-30-16 that if the stenosis in the left EIA is worse in 6 months, will likely schedule an arteriogram.  I discussed in depth with the patient the nature of atherosclerosis, and emphasized the importance of maximal medical management including strict control of blood pressure, blood glucose, and lipid levels, obtaining regular exercise, and continued cessation of smoking.  The patient is aware that without maximal medical management the underlying atherosclerotic disease process will progress, limiting the benefit of any interventions.  The patient was given information about PAD including  signs, symptoms, treatment, what symptoms should prompt the patient to seek immediate medical care, and risk reduction measures to take.  Clemon Chambers, RN, MSN, FNP-C [facility] Phone: [phone]  Clinic MD: Early on call  05/28/17 9:03 AM

## 2017-05-30 NOTE — Addendum Note (Signed)
Addended by: Lianne Cure A on: 05/30/2017 01:31 PM   Modules accepted: Orders

## 2017-06-01 ENCOUNTER — Other Ambulatory Visit: Payer: Self-pay | Admitting: Cardiovascular Disease

## 2017-06-06 ENCOUNTER — Encounter: Payer: Self-pay | Admitting: Cardiovascular Disease

## 2017-06-06 ENCOUNTER — Ambulatory Visit (INDEPENDENT_AMBULATORY_CARE_PROVIDER_SITE_OTHER): Payer: Medicare Other | Admitting: Cardiovascular Disease

## 2017-06-06 VITALS — BP 158/80 | HR 68 | Ht 67.0 in | Wt 194.2 lb

## 2017-06-06 DIAGNOSIS — I10 Essential (primary) hypertension: Secondary | ICD-10-CM

## 2017-06-06 DIAGNOSIS — I2510 Atherosclerotic heart disease of native coronary artery without angina pectoris: Secondary | ICD-10-CM | POA: Diagnosis not present

## 2017-06-06 DIAGNOSIS — E782 Mixed hyperlipidemia: Secondary | ICD-10-CM | POA: Diagnosis not present

## 2017-06-06 NOTE — Patient Instructions (Signed)

## 2017-06-06 NOTE — Progress Notes (Signed)
Cardiology Office Note   Date:  06/06/2017   ID:  Donald Mckinney, DOB [birth year], MRN [id number]  PCP:  Celene Squibb, MD  Cardiologist:   [name], MD   Chief Complaint  Patient presents with  . Coronary Artery Disease   1. Coronary artery disease-status post anterior wall myocardial infarction (Dec. 5, 2009), status post PTCA and stenting of his right coronary artery and later status post PTCA and stenting of his LAD 2. Hyperlipidemia 3. Hypertension 4. History of peripheral vascular disease-followed by [name] is a 71 year old gentleman with a history of coronary artery disease. He status post PTCA and stenting of his right coronary artery and also status post PTCA and stenting of left anterior descending artery. He also has a history of hypertension and hyperlipidemia. He has a history of severe peripheral vascular disease and is followed by Dr. Trula Slade.  His blood pressure is typically normal at home. He states that he goes up when he comes to the Dr. He has been exercising without cp or claudication.  Feb. 17, 2014:  [name] is doing very well. He's not had any episodes of chest pain or shortness of breath. He's had some basal cell cancers removed from his face. He has not been exercising as much as he would like. He is fasting today. He has been keeping a record of his BP - his readings are OK.  Nov. 10, 2014:  Donald Mckinney is doing well. His BP is high today but his BP readings at home are ok. He is playing lots of golf. He is off the niaspan.  Apr 03, 2014:  Donald Mckinney is doing well. He has had some periodontal work. He has been keeping his BP - is getting some low readings.    Nov. 11, 2015:  Donald Mckinney is doing well. No CP or dyspnea. . Playing golf regularly. Still walks 3 times a week .   He brought his BP log - his readings are quite good.    Apr 05, 2015:   Donald Mckinney is a 71 y.o. male who presents for  Follow up of his CAD  BP is  normal at home. A bit elevated here in the office . [name]    No CP   Nov. 14, 2016:  Doing well.   Has been busy picking corn.   No Cp or dyspnea.    Keeps a record of his BP  - all are in the normal range.   Apr 06, 2016:  Doing well.  No CP or dyspnea  Getting some exercise  Nov. 21, 2017:  [name] is seen for follow up of his cad Wants a 90 day supply of meds.  No CP or dyspnea   June 06, 2017:  Donald Mckinney is seen for follow up of his CAD Has developed vertigo -  Went to [facility] ER ,   Was given Antivert   No angina .     Past Medical History:  Diagnosis Date  . AAA (abdominal aortic aneurysm) ([place])   . Cancer ([place]) 2005   BCC  . Cancer of skin, face Dec. 2013  and  Jan.  2014   [facility]  Left cheek and nose;  2016  Left Periorbital  . Coronary artery disease   . Hyperlipidemia   . Hypertension   . Leg pain    with walking  . MI, old    INFERIOR WALL M.I.  . Myocardial infarction ([place]) 10/24/2008  .  PVD (peripheral vascular disease) ([place])    MODERATE TO SEVERE, INVOLVING THE DISTAL AORTA AND BILATERAL ILIACS    Past Surgical History:  Procedure Laterality Date  . CARDIAC CATHETERIZATION     WELL PRESERVED LEFT VENTRICULAR SYSTOLIC FUNCTION. EF 55%  . CORONARY ANGIOPLASTY WITH STENT PLACEMENT     RIGHT CORONARY ARTERY, LEFT ANTERIOR DESCENDING ARTERY  . CORONARY ARTERY BYPASS GRAFT    . iliac artery stenting Bilateral Jan. 7, 2010   Bilateral CIA and Right External IA  stents   . PRE-MALIGNANT / BENIGN SKIN LESION EXCISION Left Oct. 27, 2015   Posterior Neck- Benign mole     Current Outpatient Prescriptions  Medication Sig Dispense Refill  . aspirin 81 MG tablet Take 81 mg by mouth daily.      . carvedilol (COREG) 12.5 MG tablet TAKE ONE TABLET BY MOUTH TWICE DAILY 180 tablet 3  . cilostazol (PLETAL) 100 MG tablet TAKE ONE TABLET BY MOUTH TWICE DAILY 60 tablet 6  . clopidogrel (PLAVIX) 75 MG tablet TAKE ONE TABLET BY MOUTH ONCE DAILY 90 tablet 1  . fenofibrate  (TRICOR) 145 MG tablet TAKE ONE TABLET BY MOUTH ONCE DAILY 30 tablet 4  . lisinopril (PRINIVIL,ZESTRIL) 10 MG tablet TAKE ONE TABLET BY MOUTH ONCE DAILY 90 tablet 0  . loratadine (CLARITIN) 10 MG tablet Take 10 mg by mouth daily.    . meclizine (ANTIVERT) 25 MG tablet Take 1 tablet (25 mg total) by mouth 3 (three) times daily as needed for dizziness. 15 tablet 0  . Multiple Vitamin (MULTIVITAMIN) tablet Take 1 tablet by mouth daily.      [name] NITROSTAT 0.4 MG SL tablet DISSOLVE ONE TABLET UNDER THE TONGUE EVERY 5 MINUTES AS NEEDED FOR CHEST PAIN.  DO NOT EXCEED A TOTAL OF 3 DOSES IN 15 MINUTES. NO RELIEF, C 25 tablet 1  . rosuvastatin (CRESTOR) 20 MG tablet TAKE ONE TABLET BY MOUTH ONCE DAILY 90 tablet 1   No current facility-administered medications for this visit.     Allergies:   Ampicillin and Iodinated diagnostic agents    Social History:  The patient  reports that he quit smoking about 8 years ago. His smoking use included Cigarettes. He has never used smokeless tobacco. He reports that he drinks alcohol. He reports that he does not use drugs.   Family History:  The patient's family history includes Cancer in his father and mother; Hyperlipidemia in his mother.    ROS:  Please see the history of present illness.    Review of Systems: Constitutional:  denies fever, chills, diaphoresis, appetite change and fatigue.  HEENT: denies photophobia, eye pain, redness, hearing loss, ear pain, congestion, sore throat, rhinorrhea, sneezing, neck pain, neck stiffness and tinnitus.  Respiratory: denies SOB, DOE, cough, chest tightness, and wheezing.  Cardiovascular: denies chest pain, palpitations and leg swelling.  Gastrointestinal: denies nausea, vomiting, abdominal pain, diarrhea, constipation, blood in stool.  Genitourinary: denies dysuria, urgency, frequency, hematuria, flank pain and difficulty urinating.  Musculoskeletal: denies  myalgias, back pain, joint swelling, arthralgias and gait  problem.   Skin: denies pallor, rash and wound.  Neurological: denies dizziness, seizures, syncope, weakness, light-headedness, numbness and headaches.   Hematological: denies adenopathy, easy bruising, personal or family bleeding history.  Psychiatric/ Behavioral: denies suicidal ideation, mood changes, confusion, nervousness, sleep disturbance and agitation.       All other systems are reviewed and negative.    PHYSICAL EXAM: VS:  BP (!) 158/80   Pulse 68  Ht 5\' 7"  (1.702 m)   Wt 194 lb 3.2 oz (88.1 kg)   SpO2 98%   BMI 30.42 kg/m  , BMI Body mass index is 30.42 kg/m. GEN: Well nourished, well developed, in no acute distress  HEENT: normal  Neck: no JVD, carotid bruits, or masses Cardiac: RR,  No murmurs Respiratory:  clear to auscultation bilaterally, normal work of breathing GI: soft, nontender, nondistended, + BS MS: no deformity or atrophy  Skin: warm and dry, no rash Neuro:  Strength and sensation are intact Psych: normal   EKG:  EKG is ordered today. The ekg ordered today demonstrates   NSR at 67. NS ST abn.    Recent Labs: 10/10/2016: ALT 20 01/15/2017: BUN 14; Creatinine, Ser 0.80; Hemoglobin 14.3; Platelets 327; Potassium 3.9; Sodium 139    Lipid Panel    Component Value Date/Time   CHOL 130 10/10/2016 0921   TRIG 118 10/10/2016 0921   HDL 60 10/10/2016 0921   CHOLHDL 2.2 10/10/2016 0921   VLDL 24 10/10/2016 0921   LDLCALC 46 10/10/2016 0921   LDLDIRECT 80.7 10/24/2011 1049      Wt Readings from Last 3 Encounters:  06/06/17 194 lb 3.2 oz (88.1 kg)  05/28/17 196 lb (88.9 kg)  01/15/17 186 lb (84.4 kg)      Other studies Reviewed: Additional studies/ records that were reviewed today include: . Review of the above records demonstrates:    ASSESSMENT AND PLAN:  1. Coronary artery disease-status post anterior wall myocardial infarction (Dec. 5, 2009), status post PTCA and stenting of his right coronary artery and later status post PTCA and  stenting of his LAD , Is not having any angina   2. Hyperlipidemia:   Had fasting labs with his primary MD  Will check again in 6 months   3. Hypertension-  - BP readings at home are great  4. History of peripheral vascular disease-followed by Dr. Etta Quill  Current medicines are reviewed at length with the patient today.  The patient does not have concerns regarding medicines.  The following changes have been made:  no change  Labs/ tests ordered today include:  No orders of the defined types were placed in this encounter.   Disposition:   FU with me in 6 month.  Will get fasting labs at that time     [name], MD  06/06/2017 8:09 AM    [address], [place], [place]  [address] Phone: [phone]; Fax: [phone]

## 2017-09-03 ENCOUNTER — Other Ambulatory Visit: Payer: Self-pay | Admitting: Cardiovascular Disease

## 2017-09-20 ENCOUNTER — Other Ambulatory Visit: Payer: Self-pay | Admitting: Cardiovascular Disease

## 2017-09-26 ENCOUNTER — Other Ambulatory Visit: Payer: Self-pay | Admitting: Cardiovascular Disease

## 2017-10-01 ENCOUNTER — Encounter (HOSPITAL_COMMUNITY): Payer: Medicare Other

## 2017-10-01 ENCOUNTER — Ambulatory Visit: Payer: Medicare Other | Admitting: Surgery

## 2017-10-13 ENCOUNTER — Other Ambulatory Visit: Payer: Self-pay | Admitting: Cardiovascular Disease

## 2017-10-22 ENCOUNTER — Encounter: Payer: Self-pay | Admitting: Surgery

## 2017-10-22 ENCOUNTER — Other Ambulatory Visit: Payer: Self-pay | Admitting: *Deleted

## 2017-10-22 ENCOUNTER — Ambulatory Visit (INDEPENDENT_AMBULATORY_CARE_PROVIDER_SITE_OTHER): Payer: Medicare Other | Admitting: Surgery

## 2017-10-22 ENCOUNTER — Ambulatory Visit (INDEPENDENT_AMBULATORY_CARE_PROVIDER_SITE_OTHER)
Admission: RE | Admit: 2017-10-22 | Discharge: 2017-10-22 | Disposition: A | Discharge status: Home / Self Care | Payer: Medicare Other | Source: Ambulatory Visit | Attending: Family | Admitting: Family

## 2017-10-22 ENCOUNTER — Other Ambulatory Visit: Payer: Self-pay | Admitting: Cardiovascular Disease

## 2017-10-22 ENCOUNTER — Encounter: Payer: Self-pay | Admitting: *Deleted

## 2017-10-22 ENCOUNTER — Ambulatory Visit (HOSPITAL_COMMUNITY)
Admission: RE | Admit: 2017-10-22 | Discharge: 2017-10-22 | Disposition: A | Discharge status: Home / Self Care | Payer: Medicare Other | Source: Ambulatory Visit | Attending: Surgery | Admitting: Surgery

## 2017-10-22 VITALS — BP 143/87 | HR 62 | Temp 98.2°F | Resp 16 | Ht 67.0 in | Wt 189.9 lb

## 2017-10-22 DIAGNOSIS — Z87891 Personal history of nicotine dependence: Secondary | ICD-10-CM | POA: Diagnosis not present

## 2017-10-22 DIAGNOSIS — I7409 Other arterial embolism and thrombosis of abdominal aorta: Secondary | ICD-10-CM | POA: Diagnosis present

## 2017-10-22 DIAGNOSIS — Z95828 Presence of other vascular implants and grafts: Secondary | ICD-10-CM | POA: Diagnosis not present

## 2017-10-22 DIAGNOSIS — I739 Peripheral vascular disease, unspecified: Secondary | ICD-10-CM | POA: Insufficient documentation

## 2017-10-22 NOTE — Progress Notes (Signed)
[facility] [place]  Patient name: Donald Mckinney MRN: [id number] DOB: January 02, 1946 Sex: male   REASON FOR VISIT:    Follow up  HISOTRY OF PRESENT ILLNESS:    Donald Mckinney is a 71 y.o. male who in 2010 underwent stenting of the right common and external iliac artery as well as the left common iliac artery for rest pain.He is currently taking Pletal daily.  He does not complain of any claudication symptoms.  He suffered an MI in early 2010. This was treated percutaneously.  He takes a statin for hypercholesterolemia.  His blood pressure was managed with an ACE inhibitor.  He is a former smoker.   PAST MEDICAL HISTORY:   Past Medical History:  Diagnosis Date  . AAA (abdominal aortic aneurysm) ([place])   . Cancer ([place]) 2005   BCC  . Cancer of skin, face Dec. 2013  and  Jan.  2014   [facility]  Left cheek and nose;  2016  Left Periorbital  . Coronary artery disease   . Hyperlipidemia   . Hypertension   . Leg pain    with walking  . MI, old    INFERIOR WALL M.I.  . Myocardial infarction ([place]) 10/24/2008  . PVD (peripheral vascular disease) ([place])    MODERATE TO SEVERE, INVOLVING THE DISTAL AORTA AND BILATERAL ILIACS     FAMILY HISTORY:   Family History  Problem Relation Age of Onset  . Cancer Mother   . Hyperlipidemia Mother   . Cancer Father     SOCIAL HISTORY:   Social History   Tobacco Use  . Smoking status: Former Smoker    Types: Cigarettes    Last attempt to quit: 10/20/2008    Years since quitting: 9.0  . Smokeless tobacco: Never Used  Substance Use Topics  . Alcohol use: Yes    Alcohol/week: 0.0 oz    Comment: states drinking 2-3 beers a day     ALLERGIES:   Allergies  Allergen Reactions  . Ampicillin     Developed prolonged diarrhea - may have developed C. Diff colitis.    . Iodinated Diagnostic Agents Itching and Rash     CURRENT MEDICATIONS:   Current Outpatient Medications    Medication Sig Dispense Refill  . aspirin 81 MG tablet Take 81 mg by mouth daily.      . carvedilol (COREG) 12.5 MG tablet TAKE ONE TABLET BY MOUTH TWICE DAILY 180 tablet 3  . cilostazol (PLETAL) 100 MG tablet TAKE ONE TABLET BY MOUTH TWICE DAILY 60 tablet 6  . clopidogrel (PLAVIX) 75 MG tablet TAKE 1 TABLET BY MOUTH ONCE DAILY 90 tablet 1  . fenofibrate (TRICOR) 145 MG tablet TAKE 1 TABLET BY MOUTH ONCE DAILY 90 tablet 2  . lisinopril (PRINIVIL,ZESTRIL) 10 MG tablet TAKE 1 TABLET BY MOUTH ONCE DAILY 90 tablet 2  . loratadine (CLARITIN) 10 MG tablet Take 10 mg by mouth daily.    . meclizine (ANTIVERT) 25 MG tablet Take 1 tablet (25 mg total) by mouth 3 (three) times daily as needed for dizziness. 15 tablet 0  . Multiple Vitamin (MULTIVITAMIN) tablet Take 1 tablet by mouth daily.      [name] NITROSTAT 0.4 MG SL tablet DISSOLVE ONE TABLET UNDER THE TONGUE EVERY 5 MINUTES AS NEEDED FOR CHEST PAIN.  DO NOT EXCEED A TOTAL OF 3 DOSES IN 15 MINUTES. NO RELIEF, C 25 tablet 1  . rosuvastatin (CRESTOR) 20 MG tablet TAKE 1 TABLET BY MOUTH ONCE  DAILY 90 tablet 2   No current facility-administered medications for this visit.     REVIEW OF SYSTEMS:   [X]  denotes positive finding, [ ]  denotes negative finding Cardiac  Comments:  Chest pain or chest pressure:    Shortness of breath upon exertion:    Short of breath when lying flat:    Irregular heart rhythm:        Vascular    Pain in calf, thigh, or hip brought on by ambulation:    Pain in feet at night that wakes you up from your sleep:     Blood clot in your veins:    Leg swelling:         Pulmonary    Oxygen at home:    Productive cough:     Wheezing:         Neurologic    Sudden weakness in arms or legs:     Sudden numbness in arms or legs:     Sudden onset of difficulty speaking or slurred speech:    Temporary loss of vision in one eye:     Problems with dizziness:         Gastrointestinal    Blood in stool:     Vomited blood:          Genitourinary    Burning when urinating:     Blood in urine:        Psychiatric    Major depression:         Hematologic    Bleeding problems:    Problems with blood clotting too easily:        Skin    Rashes or ulcers:        Constitutional    Fever or chills:      PHYSICAL EXAM:   Vitals:   10/22/17 0927  BP: (!) 143/87  Pulse: 62  Resp: 16  Temp: 98.2 F (36.8 C)  TempSrc: Oral  SpO2: 96%  Weight: 189 lb 14.4 oz (86.1 kg)  Height: 5\' 7"  (1.702 m)    GENERAL: The patient is a well-nourished male, in no acute distress. The vital signs are documented above. CARDIAC: There is a regular rate and rhythm.  VASCULAR: Nonpalpable pedal pulses.  No carotid bruits. PULMONARY: Non-labored respirations ABDOMEN: Soft and non-tender with normal pitched bowel sounds.  MUSCULOSKELETAL: There are no major deformities or cyanosis. NEUROLOGIC: No focal weakness or paresthesias are detected. SKIN: There are no ulcers or rashes noted. PSYCHIATRIC: The patient has a normal affect.  STUDIES:   I have ordered and reviewed his vascular lab studies.  There is greater than 50% stenosis within the left common iliac artery stent and elevated velocities within the right external iliac artery stent velocities are also elevated within the right common iliac stent.  These have progressed since his last study  MEDICAL ISSUES:   Claudication, history of bilateral iliac stents.  The patient had 9 x 28 kissing iliac stents placed as well as a 7 x 60 right external iliac stent.  This was in 2010.  Ultrasound has showed progressive increase in velocities.  He is now at the point where intervention is recommended.  I discussed the details of the procedure.  He will require bilateral femoral access.  This has been scheduled for Tuesday, December 11.  He is somewhat anxious about the procedure.  I will make sure that he gets heavily sedated.  He has also requested a manual removal of his sheaths.  He does  not want to have a C clamp utilized again.    Annamarie Major, MD [facility] [phone] Pager [phone]

## 2017-10-30 ENCOUNTER — Encounter (HOSPITAL_COMMUNITY)
Admission: RE | Disposition: A | Discharge status: Home / Self Care | Payer: Self-pay | Source: Ambulatory Visit | Attending: Surgery

## 2017-10-30 ENCOUNTER — Ambulatory Visit (HOSPITAL_COMMUNITY)
Admission: RE | Admit: 2017-10-30 | Discharge: 2017-10-30 | Disposition: A | Discharge status: Home / Self Care | Payer: Medicare Other | Source: Ambulatory Visit | Attending: Surgery | Admitting: Surgery

## 2017-10-30 DIAGNOSIS — I70721 Atherosclerosis of other type of bypass graft(s) of the extremities with rest pain, right leg: Secondary | ICD-10-CM | POA: Diagnosis not present

## 2017-10-30 DIAGNOSIS — I70223 Atherosclerosis of native arteries of extremities with rest pain, bilateral legs: Secondary | ICD-10-CM | POA: Diagnosis not present

## 2017-10-30 DIAGNOSIS — I739 Peripheral vascular disease, unspecified: Secondary | ICD-10-CM | POA: Diagnosis present

## 2017-10-30 HISTORY — PX: ABDOMINAL AORTOGRAM W/LOWER EXTREMITY: CATH118223

## 2017-10-30 LAB — POCT I-STAT, CHEM 8
BUN: 12 mg/dL (ref 6–20)
CREATININE: 0.8 mg/dL (ref 0.61–1.24)
Calcium, Ion: 1.25 mmol/L (ref 1.15–1.40)
Chloride: 104 mmol/L (ref 101–111)
GLUCOSE: 121 mg/dL — AB (ref 65–99)
HEMATOCRIT: 43 % (ref 39.0–52.0)
Hemoglobin: 14.6 g/dL (ref 13.0–17.0)
Potassium: 3.9 mmol/L (ref 3.5–5.1)
Sodium: 142 mmol/L (ref 135–145)
TCO2: 23 mmol/L (ref 22–32)

## 2017-10-30 SURGERY — ABDOMINAL AORTOGRAM W/LOWER EXTREMITY
Anesthesia: LOCAL

## 2017-10-30 MED ORDER — SODIUM CHLORIDE 0.9 % WEIGHT BASED INFUSION: 1.0000 mL/kg/h | INTRAVENOUS | Status: DC

## 2017-10-30 MED ORDER — MIDAZOLAM HCL 2 MG/2ML IJ SOLN
INTRAMUSCULAR | Status: DC | PRN
  Administered 2017-10-30: 2 mg via INTRAVENOUS

## 2017-10-30 MED ORDER — SODIUM CHLORIDE 0.9 % IV SOLN: 250.0000 mL | INTRAVENOUS | Status: DC | PRN

## 2017-10-30 MED ORDER — HEPARIN (PORCINE) IN NACL 2-0.9 UNIT/ML-% IJ SOLN
INTRAMUSCULAR | Status: AC | PRN
  Administered 2017-10-30: 1000 mL via INTRA_ARTERIAL

## 2017-10-30 MED ORDER — FAMOTIDINE IN NACL 20-0.9 MG/50ML-% IV SOLN
INTRAVENOUS | Status: AC
  Administered 2017-10-30: 20 mg via INTRAVENOUS
  Filled 2017-10-30: qty 50

## 2017-10-30 MED ORDER — SODIUM CHLORIDE 0.9% FLUSH: 3.0000 mL | Freq: Two times a day (BID) | INTRAVENOUS | Status: DC

## 2017-10-30 MED ORDER — SODIUM CHLORIDE 0.9 % IV SOLN
INTRAVENOUS | Status: DC
  Administered 2017-10-30: 08:00:00 via INTRAVENOUS

## 2017-10-30 MED ORDER — DIPHENHYDRAMINE HCL 50 MG/ML IJ SOLN
25.0000 mg | INTRAMUSCULAR | Status: AC
  Administered 2017-10-30: 25 mg via INTRAVENOUS

## 2017-10-30 MED ORDER — MIDAZOLAM HCL 2 MG/2ML IJ SOLN
INTRAMUSCULAR | Status: AC
  Filled 2017-10-30: qty 2

## 2017-10-30 MED ORDER — SODIUM CHLORIDE 0.9% FLUSH: 3.0000 mL | INTRAVENOUS | Status: DC | PRN

## 2017-10-30 MED ORDER — METHYLPREDNISOLONE SODIUM SUCC 125 MG IJ SOLR
INTRAMUSCULAR | Status: AC
  Administered 2017-10-30: 125 mg via INTRAVENOUS
  Filled 2017-10-30: qty 2

## 2017-10-30 MED ORDER — FAMOTIDINE IN NACL 20-0.9 MG/50ML-% IV SOLN
20.0000 mg | INTRAVENOUS | Status: AC
  Administered 2017-10-30: 20 mg via INTRAVENOUS

## 2017-10-30 MED ORDER — HYDROMORPHONE HCL 1 MG/ML IJ SOLN: 0.5000 mg | INTRAMUSCULAR | Status: DC | PRN

## 2017-10-30 MED ORDER — OXYCODONE HCL 5 MG PO TABS: 5.0000 mg | ORAL_TABLET | ORAL | Status: DC | PRN

## 2017-10-30 MED ORDER — FENTANYL CITRATE (PF) 100 MCG/2ML IJ SOLN
INTRAMUSCULAR | Status: AC
  Filled 2017-10-30: qty 2

## 2017-10-30 MED ORDER — HYDRALAZINE HCL 20 MG/ML IJ SOLN: 5.0000 mg | INTRAMUSCULAR | Status: DC | PRN

## 2017-10-30 MED ORDER — LIDOCAINE HCL (PF) 1 % IJ SOLN
INTRAMUSCULAR | Status: AC
  Filled 2017-10-30: qty 30

## 2017-10-30 MED ORDER — FENTANYL CITRATE (PF) 100 MCG/2ML IJ SOLN
INTRAMUSCULAR | Status: DC | PRN
  Administered 2017-10-30: 100 ug via INTRAVENOUS

## 2017-10-30 MED ORDER — METHYLPREDNISOLONE SODIUM SUCC 125 MG IJ SOLR
125.0000 mg | INTRAMUSCULAR | Status: AC
  Administered 2017-10-30: 125 mg via INTRAVENOUS

## 2017-10-30 MED ORDER — LIDOCAINE HCL (PF) 1 % IJ SOLN
INTRAMUSCULAR | Status: DC | PRN
  Administered 2017-10-30: 10 mL

## 2017-10-30 MED ORDER — DIPHENHYDRAMINE HCL 50 MG/ML IJ SOLN
INTRAMUSCULAR | Status: AC
  Administered 2017-10-30: 25 mg via INTRAVENOUS
  Filled 2017-10-30: qty 1

## 2017-10-30 MED ORDER — IODIXANOL 320 MG/ML IV SOLN
INTRAVENOUS | Status: DC | PRN
  Administered 2017-10-30: 150 mL via INTRA_ARTERIAL

## 2017-10-30 MED ORDER — HEPARIN (PORCINE) IN NACL 2-0.9 UNIT/ML-% IJ SOLN
INTRAMUSCULAR | Status: AC
  Filled 2017-10-30: qty 1000

## 2017-10-30 MED ORDER — LABETALOL HCL 5 MG/ML IV SOLN: 10.0000 mg | INTRAVENOUS | Status: DC | PRN

## 2017-10-30 SURGICAL SUPPLY — 10 items
CATH OMNI FLUSH 5F 65CM (CATHETERS) ×1 IMPLANT
COVER PRB 48X5XTLSCP FOLD TPE (BAG) IMPLANT
COVER PROBE 5X48 (BAG) ×2
KIT MICROINTRODUCER STIFF 5F (SHEATH) ×1 IMPLANT
KIT PV (KITS) ×2 IMPLANT
SHEATH PINNACLE 5F 10CM (SHEATH) ×1 IMPLANT
SYR MEDRAD MARK V 150ML (SYRINGE) ×2 IMPLANT
TRANSDUCER W/STOPCOCK (MISCELLANEOUS) ×2 IMPLANT
TRAY PV CATH (CUSTOM PROCEDURE TRAY) ×2 IMPLANT
WIRE BENTSON .035X145CM (WIRE) ×1 IMPLANT

## 2017-10-30 NOTE — Op Note (Signed)
    Patient name: Donald Mckinney MRN: [id number] DOB: Jun 12, 1946 Sex: male  10/30/2017 Pre-operative Diagnosis: claudication. Post-operative diagnosis:  Same Surgeon:  Eda Paschal. [name], MD Procedure Performed: 1.  US guided cannulation of left common femoral artery 2.  Aortogram with bilateral lower extremity runoff 3.  Moderate sedation with fentanyl and versed for 25 minutes  Indications: 71 year old male with history of bilateral common iliac and right external iliac artery stenting that was done for rest pain.  He now has elevated velocities in the left common iliac stent as well as the right external iliac stent.  He also has claudication at this time.  He is indicated for angiogram with possible intervention and has been consented as such.  Findings: The aorta has a distal bulge which appears to project anteriorly.  The common iliac artery stents do not appear to have any tight stenoses.  On the left side there is runoff through the external iliac artery where there is also a possible distal common or proximal hypogastric artery aneurysm as well.  There is a patent common femoral artery on the left profunda femoris artery has a greater than 60% stenosis and there is no discernible SFA on the left side.  On the right side there is a patent external iliac artery stent and occluded common femoral artery distal to that reconstitutes the profunda.  Both superficial femoral arteries are reconstituted but on the right side there is a popliteal artery stenosis at the level of the knee joint.  Trifurcations are both patent tibial disease is incompletely evaluated given timing of contrast.   Procedure:  The patient was identified in the holding area and taken to room 8.  The patient was then placed supine on the table and prepped and draped in the usual sterile fashion.  A time out was called.  Ultrasound was used to evaluate the left common femoral artery.  It was patent .  A digital ultrasound image was  acquired.  A micropuncture needle was used to access the left common femoral artery under ultrasound guidance.  An 018 wire was advanced without resistance and a micropuncture sheath was placed.  The 018 wire was removed and a benson wire was placed.  The micropuncture sheath was exchanged for a 5 french sheath.  An omniflush catheter was advanced over the wire to the level of L-1.  An abdominal angiogram was obtained followed by an angled projections of the pelvis.  We then performed bilateral lower extremity runoff with the above findings.  Satisfied with this we removed the catheter over a wire and procedure was terminated.  Patient tolerated procedure well without immediate complication.  Contrast: 157cc  Jessup Ogas C. [name], MD [facility] Office: [phone] Pager: [phone]

## 2017-10-30 NOTE — Discharge Instructions (Signed)
Femoral Site Care °Refer to this sheet in the next few weeks. These instructions provide you with information about caring for yourself after your procedure. Your health care provider may also give you more specific instructions. Your treatment has been planned according to current medical practices, but problems sometimes occur. Call your health care provider if you have any problems or questions after your procedure. °What can I expect after the procedure? °After your procedure, it is typical to have the following: °· Bruising at the site that usually fades within 1-2 weeks. °· Blood collecting in the tissue (hematoma) that may be painful to the touch. It should usually decrease in size and tenderness within 1-2 weeks. ° °Follow these instructions at home: °· Take medicines only as directed by your health care provider. °· You may shower 24-48 hours after the procedure or as directed by your health care provider. Remove the bandage (dressing) and gently wash the site with plain soap and water. Pat the area dry with a clean towel. Do not rub the site, because this may cause bleeding. °· Do not take baths, swim, or use a hot tub until your health care provider approves. °· Check your insertion site every day for redness, swelling, or drainage. °· Do not apply powder or lotion to the site. °· Limit use of stairs to twice a day for the first 2-3 days or as directed by your health care provider. °· Do not squat for the first 2-3 days or as directed by your health care provider. °· Do not lift over 10 lb (4.5 kg) for 5 days after your procedure or as directed by your health care provider. °· Ask your health care provider when it is okay to: °? Return to work or school. °? Resume usual physical activities or sports. °? Resume sexual activity. °· Do not drive home if you are discharged the same day as the procedure. Have someone else drive you. °· You may drive 24 hours after the procedure unless otherwise instructed by  your health care provider. °· Do not operate machinery or power tools for 24 hours after the procedure or as directed by your health care provider. °· If your procedure was done as an outpatient procedure, which means that you went home the same day as your procedure, a responsible adult should be with you for the first 24 hours after you arrive home. °· Keep all follow-up visits as directed by your health care provider. This is important. °Contact a health care provider if: °· You have a fever. °· You have chills. °· You have increased bleeding from the site. Hold pressure on the site. °Get help right away if: °· You have unusual pain at the site. °· You have redness, warmth, or swelling at the site. °· You have drainage (other than a small amount of blood on the dressing) from the site. °· The site is bleeding, and the bleeding does not stop after 30 minutes of holding steady pressure on the site. °· Your leg or foot becomes pale, cool, tingly, or numb. °This information is not intended to replace advice given to you by your health care provider. Make sure you discuss any questions you have with your health care provider. °Document Released: 07/10/2014 Document Revised: 04/13/2016 Document Reviewed: 05/26/2014 °Elsevier Interactive Patient Education © 2018 [employer] °Moderate Conscious Sedation, Adult, Care After °These instructions provide you with information about caring for yourself after your procedure. Your health care provider may also give you more   specific instructions. Your treatment has been planned according to current medical practices, but problems sometimes occur. Call your health care provider if you have any problems or questions after your procedure. °What can I expect after the procedure? °After your procedure, it is common: °· To feel sleepy for several hours. °· To feel clumsy and have poor balance for several hours. °· To have poor judgment for several hours. °· To vomit if you eat too  soon. ° °Follow these instructions at home: °For at least 24 hours after the procedure: ° °· Do not: °? Participate in activities where you could fall or become injured. °? Drive. °? Use heavy machinery. °? Drink alcohol. °? Take sleeping pills or medicines that cause drowsiness. °? Make important decisions or sign legal documents. °? Take care of children on your own. °· Rest. °Eating and drinking °· Follow the diet recommended by your health care provider. °· If you vomit: °? Drink water, juice, or soup when you can drink without vomiting. °? Make sure you have little or no nausea before eating solid foods. °General instructions °· Have a responsible adult stay with you until you are awake and alert. °· Take over-the-counter and prescription medicines only as told by your health care provider. °· If you smoke, do not smoke without supervision. °· Keep all follow-up visits as told by your health care provider. This is important. °Contact a health care provider if: °· You keep feeling nauseous or you keep vomiting. °· You feel light-headed. °· You develop a rash. °· You have a fever. °Get help right away if: °· You have trouble breathing. °This information is not intended to replace advice given to you by your health care provider. Make sure you discuss any questions you have with your health care provider. °Document Released: 08/27/2013 Document Revised: 04/10/2016 Document Reviewed: 02/26/2016 °Elsevier Interactive Patient Education © 2018 [employer] ° °

## 2017-10-30 NOTE — Progress Notes (Signed)
Up and walked and tol well; right groin stable no bleeding or hematoma 

## 2017-10-30 NOTE — H&P (Signed)
   History and Physical Update  The patient was interviewed and re-examined.  The patient's previous History and Physical has been reviewed and is unchanged from Dr. Trula Slade note. Plan for aortogram possible intervention on previous bilateral iliac artery stents.  Towanna Avery C. [name], MD [facility] Office: [phone] Pager: [phone]    10/30/2017, 8:22 AM

## 2017-10-30 NOTE — Progress Notes (Signed)
Site area: left groin a 5 french sheath was removed  Site Prior to Removal:  Level 0  Pressure Applied For 20 MINUTES    Bedrest Beginning at 1100am  Manual:   Yes.    Patient Status During Pull:  stable  Post Pull Groin Site:  Level 0  Post Pull Instructions Given:  Yes.    Post Pull Pulses Present:  Yes.    Dressing Applied:  Yes.    Comments:  VS remain stable.

## 2017-10-31 ENCOUNTER — Encounter (HOSPITAL_COMMUNITY): Payer: Self-pay | Admitting: Vascular Surgery

## 2017-11-01 ENCOUNTER — Telehealth: Payer: Self-pay | Admitting: Surgery

## 2017-11-01 ENCOUNTER — Encounter: Payer: Self-pay | Admitting: Vascular Surgery

## 2017-11-01 NOTE — Addendum Note (Signed)
Addended by: Lianne Cure A on: 11/01/2017 09:40 AM   Modules accepted: Orders

## 2017-11-01 NOTE — Telephone Encounter (Signed)
-----   Message from Mena Goes, RN sent at 10/31/2017 11:00 AM EST ----- Regarding: CTA chest,abd,pelvis preop and then VWB appt    ----- Message ----- From: Serafina Mitchell, MD Sent: 10/30/2017   9:57 PM To: [facility] Charge Pool  Patient had angio by Dr. [name] today please schedule the patient for a CT angiogram of the chest abdomen and pelvis  To be performed prior to the end of the calendar year.  He will need to see me sometime in early January, but I would like to get his CT scan done before the end of the calendar year.

## 2017-11-01 NOTE — Telephone Encounter (Signed)
Sched appts 12/03/17. CTA at 12:30 at [place]. MD at 2:00. Spoke to pt.

## 2017-11-16 ENCOUNTER — Other Ambulatory Visit: Payer: Self-pay | Admitting: Surgery

## 2017-11-19 ENCOUNTER — Other Ambulatory Visit: Payer: Self-pay | Admitting: Surgery

## 2017-11-19 MED ORDER — CILOSTAZOL 100 MG PO TABS: ORAL_TABLET | ORAL | 6 refills | Status: DC

## 2017-11-30 ENCOUNTER — Telehealth: Payer: Self-pay

## 2017-11-30 ENCOUNTER — Other Ambulatory Visit: Payer: Medicare Other

## 2017-11-30 ENCOUNTER — Ambulatory Visit: Payer: Medicare Other | Admitting: Vascular Surgery

## 2017-11-30 NOTE — Progress Notes (Signed)
Phoned 13-hr prep in to patient's [facility] in [place].  Prednisone 50mg  PO 12/02/17 @ 2330, 12/03/17 @ 0530 and 1130.  Benadryl 50mg  PO 12/03/17 @1130 .  Patient aware of dosing instructions.  Brita Romp, RN

## 2017-12-03 ENCOUNTER — Ambulatory Visit
Admission: RE | Admit: 2017-12-03 | Discharge: 2017-12-03 | Disposition: A | Discharge status: Home / Self Care | Payer: Medicare Other | Source: Ambulatory Visit | Attending: Vascular Surgery | Admitting: Vascular Surgery

## 2017-12-03 ENCOUNTER — Ambulatory Visit: Payer: Medicare Other | Admitting: Surgery

## 2017-12-03 ENCOUNTER — Encounter: Payer: Self-pay | Admitting: Surgery

## 2017-12-03 VITALS — BP 164/74 | HR 75 | Resp 20 | Ht 69.0 in | Wt 193.6 lb

## 2017-12-03 DIAGNOSIS — I714 Abdominal aortic aneurysm, without rupture, unspecified: Secondary | ICD-10-CM

## 2017-12-03 DIAGNOSIS — Z95828 Presence of other vascular implants and grafts: Secondary | ICD-10-CM

## 2017-12-03 DIAGNOSIS — I7409 Other arterial embolism and thrombosis of abdominal aorta: Secondary | ICD-10-CM

## 2017-12-03 DIAGNOSIS — I70213 Atherosclerosis of native arteries of extremities with intermittent claudication, bilateral legs: Secondary | ICD-10-CM | POA: Diagnosis not present

## 2017-12-03 MED ORDER — IOPAMIDOL (ISOVUE-370) INJECTION 76%
75.0000 mL | Freq: Once | INTRAVENOUS | Status: AC | PRN
  Administered 2017-12-03: 75 mL via INTRAVENOUS

## 2017-12-03 NOTE — Progress Notes (Signed)
[facility]  Patient name: Donald Mckinney MRN: [id number] DOB: November 16, 1946 Sex: male   REASON FOR VISIT:    Follow up  HISOTRY OF PRESENT ILLNESS:     Donald Mckinney is a 72 y.o. male who in 2010 underwent stenting of the right common and external iliac artery as well as the left common iliac artery for rest pain.He is currently taking Pletal daily.  He does not complain of any claudication symptoms.  He suffered an MI in early 2010. This was treated percutaneously.  He takes a statin for hypercholesterolemia.  His blood pressure was managed with an ACE inhibitor.  He is a former smoker.  At his last visit he was found to have elevated velocities within the left common iliac stent as well as the right external iliac stent and was scheduled for angiography which was performed by Dr. [name].  His stents were felt to be widely patent.  There was concern over aneurysmal disease and therefore the patient was scheduled for CT scan.  He is back today for follow-up.  PAST MEDICAL HISTORY:   Past Medical History:  Diagnosis Date  . AAA (abdominal aortic aneurysm) ([place])   . Cancer ([place]) 2005   BCC  . Cancer of skin, face Dec. 2013  and  Jan.  2014   [facility]  Left cheek and nose;  2016  Left Periorbital  . Coronary artery disease   . Hyperlipidemia   . Hypertension   . Leg pain    with walking  . MI, old    INFERIOR WALL M.I.  . Myocardial infarction ([place]) 10/24/2008  . PVD (peripheral vascular disease) ([place])    MODERATE TO SEVERE, INVOLVING THE DISTAL AORTA AND BILATERAL ILIACS     FAMILY HISTORY:   Family History  Problem Relation Age of Onset  . Cancer Mother   . Hyperlipidemia Mother   . Cancer Father     SOCIAL HISTORY:   Social History   Tobacco Use  . Smoking status: Former Smoker    Types: Cigarettes    Last attempt to quit: 10/20/2008    Years since quitting: 9.1  . Smokeless tobacco: Never Used   Substance Use Topics  . Alcohol use: Yes    Alcohol/week: 0.0 oz    Comment: states drinking 2-3 beers a day     ALLERGIES:   Allergies  Allergen Reactions  . Ampicillin Other (See Comments)    Developed prolonged diarrhea - may have developed C. Diff colitis.  Has patient had a PCN reaction causing immediate rash, facial/tongue/throat swelling, SOB or lightheadedness with hypotension: No Has patient had a PCN reaction causing severe rash involving mucus membranes or skin necrosis: No Has patient had a PCN reaction that required hospitalization: No Has patient had a PCN reaction occurring within the last 10 years: No If all of the above answers are "NO", then may proceed with Cephalospori  . Iodinated Diagnostic Agents Itching and Rash     CURRENT MEDICATIONS:   Current Outpatient Medications  Medication Sig Dispense Refill  . aspirin 81 MG tablet Take 81 mg by mouth daily.      . carvedilol (COREG) 12.5 MG tablet TAKE ONE TABLET BY MOUTH TWICE DAILY (Patient taking differently: TAKE 12.5 MG BY MOUTH TWICE DAILY) 180 tablet 2  . cilostazol (PLETAL) 100 MG tablet TAKE 100 MG BY MOUTH TWICE DAILY 60 tablet 6  . clopidogrel (PLAVIX) 75 MG tablet TAKE 1 TABLET BY MOUTH  ONCE DAILY (Patient taking differently: TAKE 75 MG BY MOUTH ONCE DAILY) 90 tablet 1  . fenofibrate (TRICOR) 145 MG tablet TAKE 1 TABLET BY MOUTH ONCE DAILY (Patient taking differently: TAKE 145 MG BY MOUTH ONCE DAILY) 90 tablet 2  . Isopropyl Alcohol (SWIMMERS EAR DROPS OT) Place 2 drops into both ears as needed (for swimmer ear).    [name] lisinopril (PRINIVIL,ZESTRIL) 10 MG tablet TAKE 1 TABLET BY MOUTH ONCE DAILY (Patient taking differently: TAKE 10 MG BY MOUTH ONCE DAILY) 90 tablet 2  . loratadine (CLARITIN) 10 MG tablet Take 10 mg by mouth daily.    . meclizine (ANTIVERT) 25 MG tablet Take 1 tablet (25 mg total) by mouth 3 (three) times daily as needed for dizziness. 15 tablet 0  . Multiple Vitamin (MULTIVITAMIN)  tablet Take 1 tablet by mouth daily.      [name] NITROSTAT 0.4 MG SL tablet DISSOLVE ONE TABLET UNDER THE TONGUE EVERY 5 MINUTES AS NEEDED FOR CHEST PAIN.  DO NOT EXCEED A TOTAL OF 3 DOSES IN 15 MINUTES. NO RELIEF, C (Patient taking differently: Place 0.4 mg under the tongue every 5 (five) minutes as needed for chest pain. ) 25 tablet 1  . rosuvastatin (CRESTOR) 20 MG tablet TAKE 1 TABLET BY MOUTH ONCE DAILY (Patient taking differently: TAKE 20 MG BY MOUTH ONCE DAILY) 90 tablet 2   No current facility-administered medications for this visit.     REVIEW OF SYSTEMS:   [X]  denotes positive finding, [ ]  denotes negative finding Cardiac  Comments:  Chest pain or chest pressure:    Shortness of breath upon exertion:    Short of breath when lying flat:    Irregular heart rhythm:        Vascular    Pain in calf, thigh, or hip brought on by ambulation:    Pain in feet at night that wakes you up from your sleep:     Blood clot in your veins:    Leg swelling:         Pulmonary    Oxygen at home:    Productive cough:     Wheezing:         Neurologic    Sudden weakness in arms or legs:     Sudden numbness in arms or legs:     Sudden onset of difficulty speaking or slurred speech:    Temporary loss of vision in one eye:     Problems with dizziness:         Gastrointestinal    Blood in stool:     Vomited blood:         Genitourinary    Burning when urinating:     Blood in urine:        Psychiatric    Major depression:         Hematologic    Bleeding problems:    Problems with blood clotting too easily:        Skin    Rashes or ulcers:        Constitutional    Fever or chills:      PHYSICAL EXAM:   There were no vitals filed for this visit.  GENERAL: The patient is a well-nourished male, in no acute distress. The vital signs are documented above. CARDIAC: There is a regular rate and rhythm.  VASCULAR: Nonpalpable pedal pulses PULMONARY: Non-labored respirations ABDOMEN:  Soft and non-tender with normal pitched bowel sounds.  MUSCULOSKELETAL: There are no major deformities or  cyanosis. NEUROLOGIC: No focal weakness or paresthesias are detected. SKIN: There are no ulcers or rashes noted. PSYCHIATRIC: The patient has a normal affect.  STUDIES:   I have reviewed his CT scan with the following findings:  IMPRESSION: Chest CTA impression:  1. Mild uncomplicated ectasia of the ascending thoracic aorta measuring 4 cm in diameter. Recommend annual imaging followup by CTA or MRA. This recommendation follows 2010 ACCF/AHA/AATS/ACR/ASA/SCA/SCAI/SIR/STS/SVM Guidelines for the Diagnosis and Management of Patients with Thoracic Aortic Disease. Circulation. 2010; 121: B147-W295 2. Coronary artery calcifications. Aortic Atherosclerosis (ICD10-I70.0).  Abdominal and pelvic CTA impression:  1. Mild saccular aneurysmal dilatation of the distal aspect of the abdominal aorta measuring 3.3 cm in diameter. 2. Bilateral kissing common iliac stents appear widely patent. 3. Suspected tandem areas of hemodynamically significant narrowing involving the right common femoral artery with apparent occlusion of both the right superficial and deep femoral arteries. 4. Left Common iliac artery aneurysm measuring 2.8 cm in diameter. Note, there is a large amount of irregular thrombus within the left common iliac artery aneurysm. No associated perivascular stranding. 5. The left superficial femoral artery is occluded throughout its imaged course.  Non-vascular Impression:  1. Suspected hepatic steatosis.  Correlation with LFTs is advised. 2. Cough diverticulosis without evidence of diverticulitis.  MEDICAL ISSUES:   Atherosclerotic occlusive disease: Angiogram and CT scan, his stents are widely patent.  He has occlusion of the right common femoral artery and bilateral superficial femoral artery.  His symptoms of claudication remains stable.  No intervention is recommended  at this time.  AAA and left common iliac aneurysm: The maximum aortic diameter is 3.3 cm.  The maximum iliac diameter is 2.7 cm.  We have recommended continued observation.  I am going to have him follow-up in 1 year with a repeat CT angiogram.  Carotid stenosis: Patient will have repeat carotid Doppler studies follow-up in 1 year  Annamarie Major, MD [facility] [phone] Pager [phone]

## 2017-12-06 ENCOUNTER — Encounter: Payer: Self-pay | Admitting: Cardiovascular Disease

## 2017-12-06 ENCOUNTER — Ambulatory Visit: Payer: Medicare Other | Admitting: Cardiovascular Disease

## 2017-12-06 VITALS — BP 142/82 | HR 60 | Wt 192.8 lb

## 2017-12-06 DIAGNOSIS — I2510 Atherosclerotic heart disease of native coronary artery without angina pectoris: Secondary | ICD-10-CM

## 2017-12-06 DIAGNOSIS — E782 Mixed hyperlipidemia: Secondary | ICD-10-CM | POA: Diagnosis not present

## 2017-12-06 DIAGNOSIS — I10 Essential (primary) hypertension: Secondary | ICD-10-CM | POA: Diagnosis not present

## 2017-12-06 LAB — LIPID PANEL
Chol/HDL Ratio: 2.5 ratio (ref 0.0–5.0)
Cholesterol, Total: 140 mg/dL (ref 100–199)
HDL: 57 mg/dL (ref >39)
LDL Calculated: 50 mg/dL (ref 0–99)
Triglycerides: 164 mg/dL — ABNORMAL HIGH (ref 0–149)
VLDL Cholesterol Cal: 33 mg/dL (ref 5–40)

## 2017-12-06 LAB — HEPATIC FUNCTION PANEL
ALK PHOS: 37 IU/L — AB (ref 39–117)
ALT: 27 IU/L (ref 0–44)
AST: 23 IU/L (ref 0–40)
Albumin: 4.8 g/dL (ref 3.6–4.8)
BILIRUBIN TOTAL: 0.5 mg/dL (ref 0.0–1.2)
BILIRUBIN, DIRECT: 0.14 mg/dL (ref 0.00–0.40)
Total Protein: 7.2 g/dL (ref 6.0–8.5)

## 2017-12-06 NOTE — Patient Instructions (Signed)
Medication Instructions:  Your physician recommends that you continue on your current medications as directed. Please refer to the Current Medication list given to you today.   Labwork: TODAY - liver panel, cholesterol   Testing/Procedures: None Ordered   Follow-Up: Your physician wants you to follow-up in: 6 months with Dr. Acie Fredrickson. You will receive a reminder letter in the mail two months in advance. If you don't receive a letter, please call our office to schedule the follow-up appointment.   If you need a refill on your cardiac medications before your next appointment, please call your pharmacy.   Thank you for choosing [facility]! Christen Bame, RN [phone]

## 2017-12-06 NOTE — Progress Notes (Signed)
Cardiology Office Note   Date:  12/06/2017   ID:  Donald Mckinney, DOB 04-17-1946, MRN [id number]  PCP:  Celene Squibb, MD  Cardiologist:   [name], MD   Chief Complaint  Patient presents with  . Coronary Artery Disease  . Hypertension   1. Coronary artery disease-status post anterior wall myocardial infarction (Dec. 5, 2009), status post PTCA and stenting of his right coronary artery and later status post PTCA and stenting of his LAD 2. Hyperlipidemia 3. Hypertension 4. History of peripheral vascular disease-followed by [name] is a 72 year old gentleman with a history of coronary artery disease. He status post PTCA and stenting of his right coronary artery and also status post PTCA and stenting of left anterior descending artery. He also has a history of hypertension and hyperlipidemia. He has a history of severe peripheral vascular disease and is followed by Dr. Trula Slade.  His blood pressure is typically normal at home. He states that he goes up when he comes to the Dr. He has been exercising without cp or claudication.  Feb. 17, 2014:  Donald Mckinney is doing very well. He's not had any episodes of chest pain or shortness of breath. He's had some basal cell cancers removed from his face. He has not been exercising as much as he would like. He is fasting today. He has been keeping a record of his BP - his readings are OK.  Nov. 10, 2014:  Donald Mckinney is doing well. His BP is high today but his BP readings at home are ok. He is playing lots of golf. He is off the niaspan.  Apr 03, 2014:  Donald Mckinney is doing well. He has had some periodontal work. He has been keeping his BP - is getting some low readings.    Nov. 11, 2015:  Donald Mckinney is doing well. No CP or dyspnea. . Playing golf regularly. Still walks 3 times a week .   He brought his BP log - his readings are quite good.    Apr 05, 2015:   Donald Mckinney is a 72 y.o. male who presents for  Follow up of his  CAD  BP is normal at home. A bit elevated here in the office . [name]    No CP   Nov. 14, 2016:  Doing well.   Has been busy picking corn.   No Cp or dyspnea.    Keeps a record of his BP  - all are in the normal range.   Apr 06, 2016:  Doing well.  No CP or dyspnea  Getting some exercise  Nov. 21, 2017:  Donald Mckinney is seen for follow up of his cad Wants a 90 day supply of meds.  No CP or dyspnea   June 06, 2017:  Donald Mckinney is seen for follow up of his CAD Has developed vertigo -  Went to [facility] ER ,   Was given Antivert   No angina .     Jan. 17, 2019:   Doing well from cardiac standpoint Is seeing Dr. Trula Slade for AAA and iliac aneurism  Brought his BP cuff to verify  No CP or dyspnea  Eats a low salt diet   Retired from tobacco  farming and computers   Past Medical History:  Diagnosis Date  . AAA (abdominal aortic aneurysm) ([place])   . Cancer ([place]) 2005   BCC  . Cancer of skin, face Dec. 2013  and  Jan.  2014  [place]  Left cheek and nose;  2016  Left Periorbital  . Coronary artery disease   . Hyperlipidemia   . Hypertension   . Leg pain    with walking  . MI, old    INFERIOR WALL M.I.  . Myocardial infarction ([place]) 10/24/2008  . PVD (peripheral vascular disease) ([place])    MODERATE TO SEVERE, INVOLVING THE DISTAL AORTA AND BILATERAL ILIACS    Past Surgical History:  Procedure Laterality Date  . ABDOMINAL AORTOGRAM W/LOWER EXTREMITY N/A 10/30/2017   Procedure: ABDOMINAL AORTOGRAM W/LOWER EXTREMITY;  Surgeon: Waynetta Sandy, MD;  Location: [facility];  Service: Cardiovascular;  Laterality: N/A;  . CARDIAC CATHETERIZATION     WELL PRESERVED LEFT VENTRICULAR SYSTOLIC FUNCTION. EF 55%  . CORONARY ANGIOPLASTY WITH STENT PLACEMENT     RIGHT CORONARY ARTERY, LEFT ANTERIOR DESCENDING ARTERY  . CORONARY ARTERY BYPASS GRAFT    . iliac artery stenting Bilateral Jan. 7, 2010   Bilateral CIA and Right External IA  stents   . PRE-MALIGNANT / BENIGN SKIN  LESION EXCISION Left Oct. 27, 2015   Posterior Neck- Benign mole     Current Outpatient Medications  Medication Sig Dispense Refill  . aspirin 81 MG tablet Take 81 mg by mouth daily.      . carvedilol (COREG) 12.5 MG tablet TAKE ONE TABLET BY MOUTH TWICE DAILY 180 tablet 2  . cilostazol (PLETAL) 100 MG tablet TAKE 100 MG BY MOUTH TWICE DAILY 60 tablet 6  . clopidogrel (PLAVIX) 75 MG tablet TAKE 1 TABLET BY MOUTH ONCE DAILY 90 tablet 1  . fenofibrate (TRICOR) 145 MG tablet TAKE 1 TABLET BY MOUTH ONCE DAILY 90 tablet 2  . Isopropyl Alcohol (SWIMMERS EAR DROPS OT) Place 2 drops into both ears as needed (for swimmer ear).    [name] lisinopril (PRINIVIL,ZESTRIL) 10 MG tablet TAKE 1 TABLET BY MOUTH ONCE DAILY 90 tablet 2  . loratadine (CLARITIN) 10 MG tablet Take 10 mg by mouth daily.    . meclizine (ANTIVERT) 25 MG tablet Take 1 tablet (25 mg total) by mouth 3 (three) times daily as needed for dizziness. 15 tablet 0  . Multiple Vitamin (MULTIVITAMIN) tablet Take 1 tablet by mouth daily.      [name] NITROSTAT 0.4 MG SL tablet DISSOLVE ONE TABLET UNDER THE TONGUE EVERY 5 MINUTES AS NEEDED FOR CHEST PAIN.  DO NOT EXCEED A TOTAL OF 3 DOSES IN 15 MINUTES. NO RELIEF, C 25 tablet 1  . rosuvastatin (CRESTOR) 20 MG tablet TAKE 1 TABLET BY MOUTH ONCE DAILY 90 tablet 2   No current facility-administered medications for this visit.     Allergies:   Ampicillin and Iodinated diagnostic agents    Social History:  The patient  reports that he quit smoking about 9 years ago. His smoking use included cigarettes. he has never used smokeless tobacco. He reports that he drinks alcohol. He reports that he does not use drugs.   Family History:  The patient's family history includes Cancer in his father and mother; Hyperlipidemia in his mother.    ROS:  Noted in current hx.  Otherwise negative .   Physical Exam: Blood pressure (!) 142/82, pulse 60, weight 192 lb 12.8 oz (87.5 kg), SpO2 98 %.  GEN:  Well nourished, well  developed in no acute distress HEENT: Normal NECK: No JVD; No carotid bruits LYMPHATICS: No lymphadenopathy CARDIAC: RR , normal S1S2  RESPIRATORY:  Clear to auscultation without rales, wheezing or rhonchi  ABDOMEN: Soft,  non-tender, non-distended MUSCULOSKELETAL:  No edema; No deformity  SKIN: Warm and dry NEUROLOGIC:  Alert and oriented x 3   EKG: December 06, 2017: Normal sinus rhythm at 60.  Nonspecific ST abnormality.   Recent Labs: 01/15/2017: Platelets 327 10/30/2017: BUN 12; Creatinine, Ser 0.80; Hemoglobin 14.6; Potassium 3.9; Sodium 142    Lipid Panel    Component Value Date/Time   CHOL 130 10/10/2016 0921   TRIG 118 10/10/2016 0921   HDL 60 10/10/2016 0921   CHOLHDL 2.2 10/10/2016 0921   VLDL 24 10/10/2016 0921   LDLCALC 46 10/10/2016 0921   LDLDIRECT 80.7 10/24/2011 1049      Wt Readings from Last 3 Encounters:  12/06/17 192 lb 12.8 oz (87.5 kg)  12/03/17 193 lb 9.6 oz (87.8 kg)  10/30/17 188 lb (85.3 kg)      Other studies Reviewed: Additional studies/ records that were reviewed today include: . Review of the above records demonstrates:    ASSESSMENT AND PLAN:  1. Coronary artery disease-he is not having any episodes of angina.  Continue current medications.  2. Hyperlipidemia:   We will draw liver enzymes and lipids today.  His basic metabolic profile was drawn last month and looks good.  3. Hypertension-  -blood pressure overall looks very good.  4. History of peripheral vascular disease-followed by Dr. Etta Quill  Current medicines are reviewed at length with the patient today.  The patient does not have concerns regarding medicines.  The following changes have been made:  no change  Labs/ tests ordered today include:  No orders of the defined types were placed in this encounter.   Disposition:   FU with me in 6 month.  Will get fasting labs at that time     [name], MD  12/06/2017 9:54 AM    [address], [place], [place]  [address] Phone: [phone]; Fax: [phone]

## 2018-03-18 ENCOUNTER — Other Ambulatory Visit: Payer: Self-pay | Admitting: Cardiovascular Disease

## 2018-06-19 ENCOUNTER — Ambulatory Visit: Payer: Medicare Other | Admitting: Cardiovascular Disease

## 2018-06-19 ENCOUNTER — Encounter: Payer: Self-pay | Admitting: Cardiovascular Disease

## 2018-06-19 VITALS — BP 142/74 | HR 63 | Ht 67.0 in | Wt 192.4 lb

## 2018-06-19 DIAGNOSIS — I2510 Atherosclerotic heart disease of native coronary artery without angina pectoris: Secondary | ICD-10-CM

## 2018-06-19 DIAGNOSIS — E782 Mixed hyperlipidemia: Secondary | ICD-10-CM | POA: Diagnosis not present

## 2018-06-19 DIAGNOSIS — I10 Essential (primary) hypertension: Secondary | ICD-10-CM | POA: Diagnosis not present

## 2018-06-19 LAB — BASIC METABOLIC PANEL
BUN / CREAT RATIO: 13 (ref 10–24)
BUN: 12 mg/dL (ref 8–27)
CO2: 21 mmol/L (ref 20–29)
CREATININE: 0.94 mg/dL (ref 0.76–1.27)
Calcium: 10 mg/dL (ref 8.6–10.2)
Chloride: 103 mmol/L (ref 96–106)
GFR calc Af Amer: 95 mL/min/{1.73_m2} (ref >59)
GFR calc non Af Amer: 82 mL/min/{1.73_m2} (ref >59)
GLUCOSE: 102 mg/dL — AB (ref 65–99)
Potassium: 4.3 mmol/L (ref 3.5–5.2)
SODIUM: 141 mmol/L (ref 134–144)

## 2018-06-19 LAB — LIPID PANEL
Chol/HDL Ratio: 2.7 ratio (ref 0.0–5.0)
Cholesterol, Total: 151 mg/dL (ref 100–199)
HDL: 56 mg/dL (ref >39)
LDL Calculated: 63 mg/dL (ref 0–99)
TRIGLYCERIDES: 161 mg/dL — AB (ref 0–149)
VLDL CHOLESTEROL CAL: 32 mg/dL (ref 5–40)

## 2018-06-19 LAB — HEPATIC FUNCTION PANEL
ALK PHOS: 35 IU/L — AB (ref 39–117)
ALT: 24 IU/L (ref 0–44)
AST: 28 IU/L (ref 0–40)
Albumin: 4.8 g/dL (ref 3.5–4.8)
BILIRUBIN, DIRECT: 0.17 mg/dL (ref 0.00–0.40)
Bilirubin Total: 0.5 mg/dL (ref 0.0–1.2)
TOTAL PROTEIN: 7.2 g/dL (ref 6.0–8.5)

## 2018-06-19 NOTE — Progress Notes (Signed)
Cardiology Office Note   Date:  06/19/2018   ID:  PRESS CASALE, DOB 1946-08-30, MRN [id number]  PCP:  Celene Squibb, MD  Cardiologist:   [name], MD   Chief Complaint  Patient presents with  . Coronary Artery Disease   1. Coronary artery disease-status post anterior wall myocardial infarction (Dec. 5, 2009), status post PTCA and stenting of his right coronary artery and later status post PTCA and stenting of his LAD 2. Hyperlipidemia 3. Hypertension 4. History of peripheral vascular disease-followed by [name] is a 72 year old gentleman with a history of coronary artery disease. He status post PTCA and stenting of his right coronary artery and also status post PTCA and stenting of left anterior descending artery. He also has a history of hypertension and hyperlipidemia. He has a history of severe peripheral vascular disease and is followed by Dr. Trula Slade.  His blood pressure is typically normal at home. He states that he goes up when he comes to the Dr. He has been exercising without cp or claudication.  Feb. 17, 2014:  Trayquan is doing very well. He's not had any episodes of chest pain or shortness of breath. He's had some basal cell cancers removed from his face. He has not been exercising as much as he would like. He is fasting today. He has been keeping a record of his BP - his readings are OK.  Nov. 10, 2014:  Baine is doing well. His BP is high today but his BP readings at home are ok. He is playing lots of golf. He is off the niaspan.  Apr 03, 2014:  Kazi is doing well. He has had some periodontal work. He has been keeping his BP - is getting some low readings.    Nov. 11, 2015:  Rumi is doing well. No CP or dyspnea. . Playing golf regularly. Still walks 3 times a week .   He brought his BP log - his readings are quite good.    Apr 05, 2015:   SABAN HEINLEN is a 72 y.o. male who presents for  Follow up of his CAD  BP is  normal at home. A bit elevated here in the office . [name]    No CP   Nov. 14, 2016:  Doing well.   Has been busy picking corn.   No Cp or dyspnea.    Keeps a record of his BP  - all are in the normal range.   Apr 06, 2016:  Doing well.  No CP or dyspnea  Getting some exercise  Nov. 21, 2017:  Handsome is seen for follow up of his cad Wants a 90 day supply of meds.  No CP or dyspnea   June 06, 2017:  Osha is seen for follow up of his CAD Has developed vertigo -  Went to [facility] ER ,   Was given Antivert   No angina .     Jan. 17, 2019:   Doing well from cardiac standpoint Is seeing Dr. Trula Slade for AAA and iliac aneurism  Brought his BP cuff to verify  No CP or dyspnea  Eats a low salt diet   Retired from tobacco  farming and computers   June 19, 2018: Virgal seen back today for follow-up visit. Recent lab work from Dr. Juel Burrow office reveals a glucose of 104.  Creatinine 0.94.  Liver enzymes are normal.  Electrolytes are normal.  Total cholesterol is 139.  Triglyceride  levels 140.  HDL is 52.  LDL is 59.  Past Medical History:  Diagnosis Date  . AAA (abdominal aortic aneurysm) ([place])   . Cancer ([place]) 2005   BCC  . Cancer of skin, face Dec. 2013  and  Jan.  2014   [facility]  Left cheek and nose;  2016  Left Periorbital  . Coronary artery disease   . Hyperlipidemia   . Hypertension   . Leg pain    with walking  . MI, old    INFERIOR WALL M.I.  . Myocardial infarction ([place]) 10/24/2008  . PVD (peripheral vascular disease) ([place])    MODERATE TO SEVERE, INVOLVING THE DISTAL AORTA AND BILATERAL ILIACS    Past Surgical History:  Procedure Laterality Date  . ABDOMINAL AORTOGRAM W/LOWER EXTREMITY N/A 10/30/2017   Procedure: ABDOMINAL AORTOGRAM W/LOWER EXTREMITY;  Surgeon: Waynetta Sandy, MD;  Location: [facility];  Service: Cardiovascular;  Laterality: N/A;  . CARDIAC CATHETERIZATION     WELL PRESERVED LEFT VENTRICULAR SYSTOLIC FUNCTION. EF 55%  .  CORONARY ANGIOPLASTY WITH STENT PLACEMENT     RIGHT CORONARY ARTERY, LEFT ANTERIOR DESCENDING ARTERY  . CORONARY ARTERY BYPASS GRAFT    . iliac artery stenting Bilateral Jan. 7, 2010   Bilateral CIA and Right External IA  stents   . PRE-MALIGNANT / BENIGN SKIN LESION EXCISION Left Oct. 27, 2015   Posterior Neck- Benign mole     Current Outpatient Medications  Medication Sig Dispense Refill  . aspirin 81 MG tablet Take 81 mg by mouth daily.      . carvedilol (COREG) 12.5 MG tablet TAKE ONE TABLET BY MOUTH TWICE DAILY 180 tablet 2  . cilostazol (PLETAL) 100 MG tablet TAKE 100 MG BY MOUTH TWICE DAILY 60 tablet 6  . clopidogrel (PLAVIX) 75 MG tablet TAKE 1 TABLET BY MOUTH ONCE DAILY 90 tablet 1  . fenofibrate (TRICOR) 145 MG tablet TAKE 1 TABLET BY MOUTH ONCE DAILY 90 tablet 2  . Isopropyl Alcohol (SWIMMERS EAR DROPS OT) Place 2 drops into both ears as needed (for swimmer ear).    [name] lisinopril (PRINIVIL,ZESTRIL) 10 MG tablet TAKE 1 TABLET BY MOUTH ONCE DAILY 90 tablet 2  . loratadine (CLARITIN) 10 MG tablet Take 10 mg by mouth daily.    . meclizine (ANTIVERT) 25 MG tablet Take 1 tablet (25 mg total) by mouth 3 (three) times daily as needed for dizziness. 15 tablet 0  . Multiple Vitamin (MULTIVITAMIN) tablet Take 1 tablet by mouth daily.      [name] NITROSTAT 0.4 MG SL tablet DISSOLVE ONE TABLET UNDER THE TONGUE EVERY 5 MINUTES AS NEEDED FOR CHEST PAIN.  DO NOT EXCEED A TOTAL OF 3 DOSES IN 15 MINUTES. NO RELIEF, C 25 tablet 1  . rosuvastatin (CRESTOR) 20 MG tablet TAKE 1 TABLET BY MOUTH ONCE DAILY 90 tablet 2   No current facility-administered medications for this visit.     Allergies:   Ampicillin and Iodinated diagnostic agents    Social History:  The patient  reports that he quit smoking about 9 years ago. His smoking use included cigarettes. He has never used smokeless tobacco. He reports that he drinks alcohol. He reports that he does not use drugs.   Family History:  The patient's family  history includes Cancer in his father and mother; Hyperlipidemia in his mother.    ROS:  Noted in current hx.  Otherwise negative .   Physical Exam: Blood pressure (!) 142/74, pulse 63, height 5'  7" (1.702 m), weight 192 lb 6.4 oz (87.3 kg), SpO2 94 %.  GEN:  Well nourished, well developed in no acute distress HEENT: Normal NECK: No JVD; No carotid bruits LYMPHATICS: No lymphadenopathy CARDIAC: RR, no murmurs, rubs, gallops RESPIRATORY:  Clear to auscultation without rales, wheezing or rhonchi  ABDOMEN: Soft, non-tender, non-distended MUSCULOSKELETAL:  No edema; No deformity  SKIN: Warm and dry NEUROLOGIC:  Alert and oriented x 3   EKG:   Recent Labs: 10/30/2017: BUN 12; Creatinine, Ser 0.80; Hemoglobin 14.6; Potassium 3.9; Sodium 142 12/06/2017: ALT 27    Lipid Panel    Component Value Date/Time   CHOL 140 12/06/2017 1020   TRIG 164 (H) 12/06/2017 1020   HDL 57 12/06/2017 1020   CHOLHDL 2.5 12/06/2017 1020   CHOLHDL 2.2 10/10/2016 0921   VLDL 24 10/10/2016 0921   LDLCALC 50 12/06/2017 1020   LDLDIRECT 80.7 10/24/2011 1049      Wt Readings from Last 3 Encounters:  06/19/18 192 lb 6.4 oz (87.3 kg)  12/06/17 192 lb 12.8 oz (87.5 kg)  12/03/17 193 lb 9.6 oz (87.8 kg)      Other studies Reviewed: Additional studies/ records that were reviewed today include: . Review of the above records demonstrates:    ASSESSMENT AND PLAN:  1. Coronary artery disease-status post stenting of his LAD and right coronary artery.  He is doing well.  Is not had any episodes of angina.  His lipid levels have looked very stable.  2. Hyperlipidemia:   Continue current medications.  Will check fasting labs today.  3. Hypertension-blood pressure remains fairly well controlled.  He admits to eating a little bit of extra salt.  I have advised him to decrease his salt intake.  4. History of peripheral vascular disease-followed by Dr. Etta Quill  Current medicines are reviewed at length with  the patient today.  The patient does not have concerns regarding medicines.  The following changes have been made:  no change  Labs/ tests ordered today include:   Orders Placed This Encounter  Procedures  . Lipid Profile  . Basic Metabolic Panel (BMET)  . Hepatic function panel    Disposition:   FU with an APP in 6 months.   I'll see him in 1 year    [name], MD  06/19/2018 9:28 AM    [address], [place], [place]  [address] Phone: [phone]; Fax: [phone]

## 2018-06-19 NOTE — Patient Instructions (Signed)
Medication Instructions:  Your physician recommends that you continue on your current medications as directed. Please refer to the Current Medication list given to you today.   Labwork: TODAY - cholesterol, liver panel, basic metabolic panel   Testing/Procedures: None ordered   Follow-Up: Your physician wants you to follow-up in: 6 months with a PA or NP on Dr. Elmarie Shiley team. You will receive a reminder letter in the mail two months in advance. If you don't receive a letter, please call our office to schedule the follow-up appointment.   If you need a refill on your cardiac medications before your next appointment, please call your pharmacy.   Thank you for choosing [facility]! Christen Bame, RN [phone]

## 2018-06-20 ENCOUNTER — Other Ambulatory Visit: Payer: Self-pay | Admitting: Cardiovascular Disease

## 2018-07-05 ENCOUNTER — Other Ambulatory Visit: Payer: Self-pay | Admitting: Cardiovascular Disease

## 2018-07-16 ENCOUNTER — Other Ambulatory Visit: Payer: Self-pay | Admitting: Cardiovascular Disease

## 2018-08-08 ENCOUNTER — Other Ambulatory Visit: Payer: Self-pay | Admitting: Cardiovascular Disease

## 2018-09-16 ENCOUNTER — Other Ambulatory Visit: Payer: Self-pay | Admitting: Cardiovascular Disease

## 2018-12-16 ENCOUNTER — Ambulatory Visit: Payer: Medicare Other | Admitting: Physician Assistant

## 2018-12-19 ENCOUNTER — Encounter: Payer: Self-pay | Admitting: Physician Assistant

## 2018-12-19 ENCOUNTER — Ambulatory Visit: Payer: Medicare Other | Admitting: Physician Assistant

## 2018-12-19 VITALS — BP 140/70 | HR 68 | Ht 67.0 in | Wt 198.0 lb

## 2018-12-19 DIAGNOSIS — I2510 Atherosclerotic heart disease of native coronary artery without angina pectoris: Secondary | ICD-10-CM | POA: Diagnosis not present

## 2018-12-19 DIAGNOSIS — E782 Mixed hyperlipidemia: Secondary | ICD-10-CM | POA: Diagnosis not present

## 2018-12-19 DIAGNOSIS — I10 Essential (primary) hypertension: Secondary | ICD-10-CM

## 2018-12-19 NOTE — Progress Notes (Signed)
Cardiology Office Note    Date:  12/19/2018   ID:  Donald Mckinney, DOB 29-Feb-1946, MRN [id number]  PCP:  Celene Squibb, MD  Cardiologist: Dr. Acie Fredrickson  Chief Complaint: 6 Months follow up  History of Present Illness:   Donald Mckinney is a 73 y.o. male with hx of CAD, HTN, HLD and PVD (followed by Dr. Trula Slade) presents for follow up.   Hx of CAD status post anterior wall myocardial infarction (Dec. 5, 2009), status post PTCA and stenting of his right coronary artery and later status post PTCA and stenting of his LAD  He was doing well on cardiac stand point when last seen by Dr. Acie Fredrickson 05/2018.  Here today for follow up. 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 The patient denies nausea, vomiting, fever, chest pain, palpitations, shortness of breath, orthopnea, PND, dizziness, syncope, cough, congestion, abdominal pain, hematochezia, melena, lower extremity edema.   Past Medical History:  Diagnosis Date  . AAA (abdominal aortic aneurysm) ([place])   . Cancer ([place]) 2005   BCC  . Cancer of skin, face Dec. 2013  and  Jan.  2014   [facility]  Left cheek and nose;  2016  Left Periorbital  . Coronary artery disease   . Hyperlipidemia   . Hypertension   . Leg pain    with walking  . MI, old    INFERIOR WALL M.I.  . Myocardial infarction ([place]) 10/24/2008  . PVD (peripheral vascular disease) ([place])    MODERATE TO SEVERE, INVOLVING THE DISTAL AORTA AND BILATERAL ILIACS    Past Surgical History:  Procedure Laterality Date  . ABDOMINAL AORTOGRAM W/LOWER EXTREMITY N/A 10/30/2017   Procedure: ABDOMINAL AORTOGRAM W/LOWER EXTREMITY;  Surgeon: Waynetta Sandy, MD;  Location: [facility];  Service: Cardiovascular;  Laterality: N/A;  . CARDIAC CATHETERIZATION     WELL PRESERVED LEFT VENTRICULAR SYSTOLIC FUNCTION. EF 55%  . CORONARY ANGIOPLASTY WITH STENT PLACEMENT     RIGHT CORONARY ARTERY, LEFT ANTERIOR DESCENDING ARTERY  . CORONARY ARTERY BYPASS GRAFT    . iliac artery stenting Bilateral  Jan. 7, 2010   Bilateral CIA and Right External IA  stents   . PRE-MALIGNANT / BENIGN SKIN LESION EXCISION Left Oct. 27, 2015   Posterior Neck- Benign mole    Current Medications: Prior to Admission medications   Medication Sig Start Date End Date Taking? Authorizing Provider  aspirin 81 MG tablet Take 81 mg by mouth daily.      [provider]  carvedilol (COREG) 12.5 MG tablet TAKE 1 TABLET BY MOUTH TWICE DAILY 08/08/18   Nahser, Wonda Cheng, MD  cilostazol (PLETAL) 100 MG tablet TAKE 100 MG BY MOUTH TWICE DAILY 11/19/17   Serafina Mitchell, MD  clopidogrel (PLAVIX) 75 MG tablet TAKE 1 TABLET BY MOUTH ONCE DAILY 09/16/18   Nahser, Wonda Cheng, MD  fenofibrate (TRICOR) 145 MG tablet TAKE 1 TABLET BY MOUTH ONCE DAILY 07/16/18   Nahser, Wonda Cheng, MD  Isopropyl Alcohol (SWIMMERS EAR DROPS OT) Place 2 drops into both ears as needed (for swimmer ear).    [provider]  lisinopril (PRINIVIL,ZESTRIL) 10 MG tablet TAKE 1 TABLET BY MOUTH ONCE DAILY 06/21/18   Nahser, Wonda Cheng, MD  loratadine (CLARITIN) 10 MG tablet Take 10 mg by mouth daily.    [provider]  meclizine (ANTIVERT) 25 MG tablet Take 1 tablet (25 mg total) by mouth 3 (three) times daily as needed for dizziness. 01/15/17   Francine Graven, DO  Multiple Vitamin (MULTIVITAMIN) tablet Take 1 tablet by mouth daily.      [provider]  NITROSTAT 0.4 MG SL tablet DISSOLVE ONE TABLET UNDER THE TONGUE EVERY 5 MINUTES AS NEEDED FOR CHEST PAIN.  DO NOT EXCEED A TOTAL OF 3 DOSES IN 15 MINUTES. NO RELIEF, C 03/07/16   Nahser, Wonda Cheng, MD  rosuvastatin (CRESTOR) 20 MG tablet TAKE 1 TABLET BY MOUTH ONCE DAILY 07/05/18   Nahser, Wonda Cheng, MD    Allergies:   Ampicillin and Iodinated diagnostic agents   Social History   Socioeconomic History  . Marital status: Married    Spouse name: Not on file  . Number of children: Not on file  . Years of education: Not on file  . Highest education level: Not on file    Occupational History  . Not on file  Social Needs  . Financial resource strain: Not on file  . Food insecurity:    Worry: Not on file    Inability: Not on file  . Transportation needs:    Medical: Not on file    Non-medical: Not on file  Tobacco Use  . Smoking status: Former Smoker    Types: Cigarettes    Last attempt to quit: 10/20/2008    Years since quitting: 10.1  . Smokeless tobacco: Never Used  Substance and Sexual Activity  . Alcohol use: Yes    Comment: states drinking 2-3 beers a day  . Drug use: No  . Sexual activity: Not on file  Lifestyle  . Physical activity:    Days per week: Not on file    Minutes per session: Not on file  . Stress: Not on file  Relationships  . Social connections:    Talks on phone: Not on file    Gets together: Not on file    Attends religious service: Not on file    Active member of club or organization: Not on file    Attends meetings of clubs or organizations: Not on file    Relationship status: Not on file  Other Topics Concern  . Not on file  Social History Narrative  . Not on file     Family History:  The patient's family history includes Cancer in his father and mother; Hyperlipidemia in his mother.   ROS:   Please see the history of present illness.    ROS All other systems reviewed and are negative.   PHYSICAL EXAM:   VS:  BP 140/70   Pulse 68   Ht 5\' 7"  (1.702 m)   Wt 198 lb (89.8 kg)   SpO2 98%   BMI 31.01 kg/m    GEN: Well nourished, well developed, in no acute distress  HEENT: normal  Neck: no JVD, carotid bruits, or masses Cardiac: RRR; no murmurs, rubs, or gallops,no edema  Respiratory:  clear to auscultation bilaterally, normal work of breathing GI: soft, nontender, nondistended, + BS MS: no deformity or atrophy  Skin: warm and dry, no rash Neuro:  Alert and Oriented x 3, Strength and sensation are intact Psych: euthymic mood, full affect  Wt Readings from Last 3 Encounters:  12/19/18 198 lb (89.8  kg)  06/19/18 192 lb 6.4 oz (87.3 kg)  12/06/17 192 lb 12.8 oz (87.5 kg)      Studies/Labs Reviewed:   EKG:  EKG is ordered today.  The ekg ordered today demonstrates NSR with non specific ST changes   Recent Labs: 06/19/2018: ALT 24; BUN 12; Creatinine, Ser 0.94;  Potassium 4.3; Sodium 141   Lipid Panel    Component Value Date/Time   CHOL 151 06/19/2018 0931   TRIG 161 (H) 06/19/2018 0931   HDL 56 06/19/2018 0931   CHOLHDL 2.7 06/19/2018 0931   CHOLHDL 2.2 10/10/2016 0921   VLDL 24 10/10/2016 0921   LDLCALC 63 06/19/2018 0931   LDLDIRECT 80.7 10/24/2011 1049    Additional studies/ records that were reviewed today include:   Chest CTA impression: 11/2017  1. Mild uncomplicated ectasia of the ascending thoracic aorta measuring 4 cm in diameter. Recommend annual imaging followup by CTA or MRA. This recommendation follows 2010 ACCF/AHA/AATS/ACR/ASA/SCA/SCAI/SIR/STS/SVM Guidelines for the Diagnosis and Management of Patients with Thoracic Aortic Disease. Circulation. 2010; 121: M355-H741 2. Coronary artery calcifications. Aortic Atherosclerosis (ICD10-I70.0).  Abdominal and pelvic CTA impression: 11/2017  1. Mild saccular aneurysmal dilatation of the distal aspect of the abdominal aorta measuring 3.3 cm in diameter. 2. Bilateral kissing common iliac stents appear widely patent. 3. Suspected tandem areas of hemodynamically significant narrowing involving the right common femoral artery with apparent occlusion of both the right superficial and deep femoral arteries. 4. Left Common iliac artery aneurysm measuring 2.8 cm in diameter. Note, there is a large amount of irregular thrombus within the left common iliac artery aneurysm. No associated perivascular stranding. 5. The left superficial femoral artery is occluded throughout its imaged course.  Non-vascular Impression:  1. Suspected hepatic steatosis.  Correlation with LFTs is advised. 2. Cough diverticulosis  without evidence of diverticulitis.      ASSESSMENT & PLAN:    1. CAD - No angina. Continue ASA, Plavix, statin and BB.   2. HTN - BP stable on current medications.   3. HLD - Tolerating statin. LDL at goal.   4. PVD  - Followed by Dr. Trula Slade    Medication Adjustments/Labs and Tests Ordered: Current medicines are reviewed at length with the patient today.  Concerns regarding medicines are outlined above.  Medication changes, Labs and Tests ordered today are listed in the Patient Instructions below. Patient Instructions  Medication Instructions:  Your physician recommends that you continue on your current medications as directed. Please refer to the Current Medication list given to you today.  If you need a refill on your cardiac medications before your next appointment, please call your pharmacy.   Lab work: None  If you have labs (blood work) drawn today and your tests are completely normal, you will receive your results only by: [name] MyChart Message (if you have MyChart) OR . A paper copy in the mail If you have any lab test that is abnormal or we need to change your treatment, we will call you to review the results.  Testing/Procedures: None  Follow-Up: At [facility], you and your health needs are our priority.  As part of our continuing mission to provide you with exceptional heart care, we have created designated Provider Care Teams.  These Care Teams include your primary Cardiologist (physician) and Advanced Practice Providers (APPs -  Physician Assistants and Nurse Practitioners) who all work together to provide you with the care you need, when you need it. You will need a follow up appointment in:  6 months.  Please call our office 2 months in advance to schedule this appointment.  You may see [name], MD or one of the following Advanced Practice Providers on your designated Care Team: Richardson Dopp, PA-C [place], [place] . Daune Perch, NP  Any  Other Special Instructions Will Be Listed Below (  If Applicable).       Jarrett Soho, [place]  12/19/2018 8:52 AM    [facility] [place], [place],   [address] Phone: [phone]; Fax: [phone]

## 2018-12-19 NOTE — Patient Instructions (Signed)

## 2019-02-19 ENCOUNTER — Other Ambulatory Visit: Payer: Self-pay | Admitting: Surgery

## 2019-05-15 ENCOUNTER — Other Ambulatory Visit: Payer: Self-pay | Admitting: Cardiovascular Disease

## 2019-06-16 ENCOUNTER — Other Ambulatory Visit: Payer: Self-pay | Admitting: Cardiovascular Disease

## 2019-06-23 ENCOUNTER — Other Ambulatory Visit: Payer: Self-pay | Admitting: Cardiovascular Disease

## 2019-06-23 MED ORDER — LISINOPRIL 10 MG PO TABS: 10.0000 mg | ORAL_TABLET | Freq: Every day | ORAL | 1 refills | Status: DC

## 2019-06-30 ENCOUNTER — Telehealth: Payer: Self-pay | Admitting: Nurse Practitioner

## 2019-06-30 DIAGNOSIS — E782 Mixed hyperlipidemia: Secondary | ICD-10-CM

## 2019-06-30 DIAGNOSIS — I2510 Atherosclerotic heart disease of native coronary artery without angina pectoris: Secondary | ICD-10-CM

## 2019-06-30 DIAGNOSIS — I10 Essential (primary) hypertension: Secondary | ICD-10-CM

## 2019-06-30 NOTE — Telephone Encounter (Signed)
YOUR CARDIOLOGY TEAM HAS ARRANGED FOR AN E-VISIT FOR YOUR APPOINTMENT - PLEASE REVIEW IMPORTANT INFORMATION BELOW SEVERAL DAYS PRIOR TO YOUR APPOINTMENT  Due to the recent COVID-19 pandemic, we are transitioning in-person office visits to tele-medicine visits in an effort to decrease unnecessary exposure to our patients, their families, and staff. These visits are billed to your insurance just like a normal visit is. We also encourage you to sign up for MyChart if you have not already done so. You will need a smartphone if possible. For patients that do not have this, we can still complete the visit using a regular telephone but do prefer a smartphone to enable video when possible. You may have a family member that lives with you that can help. If possible, we also ask that you have a blood pressure cuff and scale at home to measure your blood pressure, heart rate and weight prior to your scheduled appointment. Patients with clinical needs that need an in-person evaluation and testing will still be able to come to the office if absolutely necessary. If you have any questions, feel free to call our office.     YOUR PROVIDER WILL BE USING THE FOLLOWING PLATFORM TO COMPLETE YOUR VISIT: Doxy.Me   . IF USING DOXIMITY or [email or web address].ME - The staff will give you instructions on receiving your link to join the meeting the day of your visit.    2-3 DAYS BEFORE YOUR APPOINTMENT  You will receive a telephone call from one of our [place] team members - your caller ID may say "Unknown caller." If this is a video visit, we will walk you through how to get the video launched on your phone. We will remind you check your blood pressure, heart rate and weight prior to your scheduled appointment. If you have an Apple Watch or Kardia, please upload any pertinent ECG strips the day before or morning of your appointment to [place]. Our staff will also make sure you have reviewed the consent and agree to move forward with your  scheduled tele-health visit.    THE DAY OF YOUR APPOINTMENT  Approximately 15 minutes prior to your scheduled appointment, you will receive a telephone call from one of [place] team - your caller ID may say "Unknown caller."  Our staff will confirm medications, vital signs for the day and any symptoms you may be experiencing. Please have this information available prior to the time of visit start. It may also be helpful for you to have a pad of paper and pen handy for any instructions given during your visit. They will also walk you through joining the smartphone meeting if this is a video visit.  CONSENT FOR TELE-HEALTH VISIT - PLEASE REVIEW  I hereby voluntarily request, consent and authorize [facility] and its employed or contracted physicians, physician assistants, nurse practitioners or other licensed health care professionals (the Practitioner), to provide me with telemedicine health care services (the "Services") as deemed necessary by the treating Practitioner. I acknowledge and consent to receive the Services by the Practitioner via telemedicine. I understand that the telemedicine visit will involve communicating with the Practitioner through live audiovisual communication technology and the disclosure of certain medical information by electronic transmission. I acknowledge that I have been given the opportunity to request an in-person assessment or other available alternative prior to the telemedicine visit and am voluntarily participating in the telemedicine visit.  I understand that I have the right to withhold or withdraw my consent to the use of telemedicine in  the course of my care at any time, without affecting my right to future care or treatment, and that the Practitioner or I may terminate the telemedicine visit at any time. I understand that I have the right to inspect all information obtained and/or recorded in the course of the telemedicine visit and may receive copies of  available information for a reasonable fee.  I understand that some of the potential risks of receiving the Services via telemedicine include:  [name] Delay or interruption in medical evaluation due to technological equipment failure or disruption; . Information transmitted may not be sufficient (e.g. poor resolution of images) to allow for appropriate medical decision making by the Practitioner; and/or  . In rare instances, security protocols could fail, causing a breach of personal health information.  Furthermore, I acknowledge that it is my responsibility to provide information about my medical history, conditions and care that is complete and accurate to the best of my ability. I acknowledge that Practitioner's advice, recommendations, and/or decision may be based on factors not within their control, such as incomplete or inaccurate data provided by me or distortions of diagnostic images or specimens that may result from electronic transmissions. I understand that the practice of medicine is not an exact science and that Practitioner makes no warranties or guarantees regarding treatment outcomes. I acknowledge that I will receive a copy of this consent concurrently upon execution via email to the email address I last provided but may also request a printed copy by calling the office of [place].    I understand that my insurance will be billed for this visit.   I have read or had this consent read to me. . I understand the contents of this consent, which adequately explains the benefits and risks of the Services being provided via telemedicine.  . I have been provided ample opportunity to ask questions regarding this consent and the Services and have had my questions answered to my satisfaction. . I give my informed consent for the services to be provided through the use of telemedicine in my medical care  By participating in this telemedicine visit I agree to the above.

## 2019-07-01 ENCOUNTER — Other Ambulatory Visit: Payer: Self-pay | Admitting: Cardiovascular Disease

## 2019-07-03 MED ORDER — ROSUVASTATIN CALCIUM 20 MG PO TABS: 20.0000 mg | ORAL_TABLET | Freq: Every day | ORAL | 0 refills | Status: DC

## 2019-07-03 NOTE — Addendum Note (Signed)
Addended by: Derl Barrow on: 07/03/2019 08:09 AM   Modules accepted: Orders

## 2019-07-08 ENCOUNTER — Other Ambulatory Visit: Payer: Medicare Other

## 2019-07-08 ENCOUNTER — Other Ambulatory Visit: Payer: Self-pay

## 2019-07-08 DIAGNOSIS — I10 Essential (primary) hypertension: Secondary | ICD-10-CM

## 2019-07-08 DIAGNOSIS — I2510 Atherosclerotic heart disease of native coronary artery without angina pectoris: Secondary | ICD-10-CM

## 2019-07-08 DIAGNOSIS — E782 Mixed hyperlipidemia: Secondary | ICD-10-CM

## 2019-07-08 LAB — BASIC METABOLIC PANEL
BUN/Creatinine Ratio: 10 (ref 10–24)
BUN: 10 mg/dL (ref 8–27)
CO2: 20 mmol/L (ref 20–29)
Calcium: 9.9 mg/dL (ref 8.6–10.2)
Chloride: 103 mmol/L (ref 96–106)
Creatinine, Ser: 0.99 mg/dL (ref 0.76–1.27)
GFR calc Af Amer: 88 mL/min/{1.73_m2} (ref >59)
GFR calc non Af Amer: 76 mL/min/{1.73_m2} (ref >59)
Glucose: 97 mg/dL (ref 65–99)
Potassium: 4.7 mmol/L (ref 3.5–5.2)
Sodium: 140 mmol/L (ref 134–144)

## 2019-07-08 LAB — HEPATIC FUNCTION PANEL
ALT: 22 IU/L (ref 0–44)
AST: 29 IU/L (ref 0–40)
Albumin: 5 g/dL — ABNORMAL HIGH (ref 3.7–4.7)
Alkaline Phosphatase: 35 IU/L — ABNORMAL LOW (ref 39–117)
Bilirubin Total: 0.5 mg/dL (ref 0.0–1.2)
Bilirubin, Direct: 0.21 mg/dL (ref 0.00–0.40)
Total Protein: 7.5 g/dL (ref 6.0–8.5)

## 2019-07-08 LAB — LIPID PANEL
Chol/HDL Ratio: 2.6 ratio (ref 0.0–5.0)
Cholesterol, Total: 135 mg/dL (ref 100–199)
HDL: 52 mg/dL (ref >39)
LDL Calculated: 50 mg/dL (ref 0–99)
Triglycerides: 166 mg/dL — ABNORMAL HIGH (ref 0–149)
VLDL Cholesterol Cal: 33 mg/dL (ref 5–40)

## 2019-07-10 NOTE — Progress Notes (Signed)
Virtual Visit via Telephone Note   This visit type was conducted due to national recommendations for restrictions regarding the COVID-19 Pandemic (e.g. social distancing) in an effort to limit this patient's exposure and mitigate transmission in our community.  Due to his co-morbid illnesses, this patient is at least at moderate risk for complications without adequate follow up.  This format is felt to be most appropriate for this patient at this time.  The patient did not have access to video technology/had technical difficulties with video requiring transitioning to audio format only (telephone).  All issues noted in this document were discussed and addressed.  No physical exam could be performed with this format.  Please refer to the patient's chart for his  consent to telehealth for [facility].   Date:  07/10/2019   ID:  Donald Mckinney, DOB 03/09/1946, MRN [id number]  Patient Location: Home Provider Location: Home PCP:  Celene Squibb, MD  Cardiologist:  [name], MD  Electrophysiologist:  None   1. Coronary artery disease-status post anterior wall myocardial infarction (Dec. 5, 2009), status post PTCA and stenting of his right coronary artery and later status post PTCA and stenting of his LAD 2. Hyperlipidemia 3. Hypertension 4. History of peripheral vascular disease-followed by [name] is a 73 year old gentleman with a history of coronary artery disease. He status post PTCA and stenting of his right coronary artery and also status post PTCA and stenting of left anterior descending artery. He also has a history of hypertension and hyperlipidemia. He has a history of severe peripheral vascular disease and is followed by Dr. Trula Slade.  His blood pressure is typically normal at home. He states that he goes up when he comes to the Dr. He has been exercising without cp or claudication.  Feb. 17, 2014:  Donald Mckinney is doing very well. He's not had any episodes of chest  pain or shortness of breath. He's had some basal cell cancers removed from his face. He has not been exercising as much as he would like. He is fasting today. He has been keeping a record of his BP - his readings are OK.  Nov. 10, 2014:  Donald Mckinney is doing well. His BP is high today but his BP readings at home are ok. He is playing lots of golf. He is off the niaspan.  Apr 03, 2014:  Donald Mckinney is doing well. He has had some periodontal work. He has been keeping his BP - is getting some low readings.    Nov. 11, 2015:  Donald Mckinney is doing well. No CP or dyspnea. . Playing golf regularly. Still walks 3 times a week .   He brought his BP log - his readings are quite good.    Apr 05, 2015:              Donald Mckinney is a 73 y.o. male who presents for  Follow up of his CAD  BP is normal at home. A bit elevated here in the office . [name]    No CP   Nov. 14, 2016:  Doing well.   Has been busy picking corn.   No Cp or dyspnea.    Keeps a record of his BP  - all are in the normal range.   Apr 06, 2016:  Doing well.  No CP or dyspnea  Getting some exercise  Nov. 21, 2017:  Donald Mckinney is seen for follow up of his cad Wants a 90 day supply  of meds.  No CP or dyspnea   June 06, 2017:  Donald Mckinney is seen for follow up of his CAD Has developed vertigo -  Went to [facility] ER ,   Was given Antivert   No angina .     Jan. 17, 2019:   Doing well from cardiac standpoint Is seeing Dr. Trula Slade for AAA and iliac aneurism  Brought his BP cuff to verify  No CP or dyspnea  Eats a low salt diet   Retired from tobacco  farming and computers   June 19, 2018: Donald Mckinney seen back today for follow-up visit. Recent lab work from Dr. Juel Burrow office reveals a glucose of 104.  Creatinine 0.94.  Liver enzymes are normal.  Electrolytes are normal.  Total cholesterol is 139.  Triglyceride levels 140.  HDL is 52.  LDL is 59.  Aug. 20, 2020      Evaluation Performed:  Follow-Up  Visit  Chief Complaint:  CAD, HTN, PVD   Donald Mckinney is a 73 y.o. male with  CAD , HTN, PVD .   Doing well.  Staying active. Growing a large garden .  Plays some golf,  Walks  No CP or dyspnea.   VS look good this am Labs from 3 days ago Chol levels look good.  Trigs are a bit elevated.    The patient does not have symptoms concerning for COVID-19 infection (fever, chills, cough, or new shortness of breath).    Past Medical History:  Diagnosis Date  . AAA (abdominal aortic aneurysm) ([place])   . Cancer ([place]) 2005   BCC  . Cancer of skin, face Dec. 2013  and  Jan.  2014   [facility]  Left cheek and nose;  2016  Left Periorbital  . Coronary artery disease   . Hyperlipidemia   . Hypertension   . Leg pain    with walking  . MI, old    INFERIOR WALL M.I.  . Myocardial infarction ([place]) 10/24/2008  . PVD (peripheral vascular disease) ([place])    MODERATE TO SEVERE, INVOLVING THE DISTAL AORTA AND BILATERAL ILIACS   Past Surgical History:  Procedure Laterality Date  . ABDOMINAL AORTOGRAM W/LOWER EXTREMITY N/A 10/30/2017   Procedure: ABDOMINAL AORTOGRAM W/LOWER EXTREMITY;  Surgeon: Waynetta Sandy, MD;  Location: [facility];  Service: Cardiovascular;  Laterality: N/A;  . CARDIAC CATHETERIZATION     WELL PRESERVED LEFT VENTRICULAR SYSTOLIC FUNCTION. EF 55%  . CORONARY ANGIOPLASTY WITH STENT PLACEMENT     RIGHT CORONARY ARTERY, LEFT ANTERIOR DESCENDING ARTERY  . CORONARY ARTERY BYPASS GRAFT    . iliac artery stenting Bilateral Jan. 7, 2010   Bilateral CIA and Right External IA  stents   . PRE-MALIGNANT / BENIGN SKIN LESION EXCISION Left Oct. 27, 2015   Posterior Neck- Benign mole     No outpatient medications have been marked as taking for the 07/11/19 encounter (Appointment) with Quinlyn Tep, Wonda Cheng, MD.     Allergies:   Ampicillin and Iodinated diagnostic agents   Social History   Tobacco Use  . Smoking status: Former Smoker    Types: Cigarettes    Quit date:  10/20/2008    Years since quitting: 10.7  . Smokeless tobacco: Never Used  Substance Use Topics  . Alcohol use: Yes    Comment: states drinking 2-3 beers a day  . Drug use: No     Family Hx: The patient's family history includes Cancer in his father and mother; Hyperlipidemia in  his mother.  ROS:   Please see the history of present illness.     All other systems reviewed and are negative.   Prior CV studies:   The following studies were reviewed today:    Labs/Other Tests and Data Reviewed:    EKG:  No ECG reviewed.  Recent Labs: 07/08/2019: ALT 22; BUN 10; Creatinine, Ser 0.99; Potassium 4.7; Sodium 140   Recent Lipid Panel Lab Results  Component Value Date/Time   CHOL 135 07/08/2019 08:57 AM   TRIG 166 (H) 07/08/2019 08:57 AM   HDL 52 07/08/2019 08:57 AM   CHOLHDL 2.6 07/08/2019 08:57 AM   CHOLHDL 2.2 10/10/2016 09:21 AM   LDLCALC 50 07/08/2019 08:57 AM   LDLDIRECT 80.7 10/24/2011 10:49 AM    Wt Readings from Last 3 Encounters:  12/19/18 198 lb (89.8 kg)  06/19/18 192 lb 6.4 oz (87.3 kg)  12/06/17 192 lb 12.8 oz (87.5 kg)     Objective:    Vital Signs:  There were no vitals taken for this visit.     ASSESSMENT & PLAN:    1.    CAD :   Salathiel is doing well.  He is not having any episodes of chest pain or shortness of breath.  Continue with her same medications.  His cholesterol levels look great.  Triglyceride levels remain mildly elevated I encouraged him to cut back on his intake of high-fat beef.  Of also encouraged him to not eat that fried anything.  Have encouraged him to exercise more.  2.  Hyperlipidemia: Continue atorvastatin.  Overall his labs look good.  His triglycerides are 166.  Encouraged him to work on a better diet and exercise program.  3.  Peripheral vascular disease: He is stable.  Not having any episodes of claudication.    COVID-19 Education: The signs and symptoms of COVID-19 were discussed with the patient and how to seek care for  testing (follow up with PCP or arrange E-visit).  The importance of social distancing was discussed today.  Time:   Today, I have spent  18  minutes with the patient with telehealth technology discussing the above problems.     Medication Adjustments/Labs and Tests Ordered: Current medicines are reviewed at length with the patient today.  Concerns regarding medicines are outlined above.   Tests Ordered: No orders of the defined types were placed in this encounter.   Medication Changes: No orders of the defined types were placed in this encounter.   Follow Up:  In Person in 1 year(s)  Signed, [name], MD  07/10/2019 8:09 AM    [place]

## 2019-07-11 ENCOUNTER — Encounter: Payer: Self-pay | Admitting: Cardiovascular Disease

## 2019-07-11 ENCOUNTER — Other Ambulatory Visit: Payer: Self-pay

## 2019-07-11 ENCOUNTER — Telehealth (INDEPENDENT_AMBULATORY_CARE_PROVIDER_SITE_OTHER): Payer: Medicare Other | Admitting: Cardiovascular Disease

## 2019-07-11 VITALS — BP 112/60 | HR 59 | Ht 67.0 in | Wt 195.0 lb

## 2019-07-11 DIAGNOSIS — E782 Mixed hyperlipidemia: Secondary | ICD-10-CM

## 2019-07-11 DIAGNOSIS — Z7189 Other specified counseling: Secondary | ICD-10-CM | POA: Diagnosis not present

## 2019-07-11 DIAGNOSIS — I2510 Atherosclerotic heart disease of native coronary artery without angina pectoris: Secondary | ICD-10-CM

## 2019-07-11 DIAGNOSIS — I10 Essential (primary) hypertension: Secondary | ICD-10-CM

## 2019-07-11 NOTE — Patient Instructions (Signed)
Medication Instructions:  Your physician recommends that you continue on your current medications as directed. Please refer to the Current Medication list given to you today.  If you need a refill on your cardiac medications before your next appointment, please call your pharmacy.    Lab work: Your physician recommends that you return for lab work in: 12 months on the day of or a few days before your office visit with Dr. Nahser.  You will need to FAST for this appointment - nothing to eat or drink after midnight the night before except water.    Testing/Procedures: None Ordered   Follow-Up: At [facility], you and your health needs are our priority.  As part of our continuing mission to provide you with exceptional heart care, we have created designated Provider Care Teams.  These Care Teams include your primary Cardiologist (physician) and Advanced Practice Providers (APPs -  Physician Assistants and Nurse Practitioners) who all work together to provide you with the care you need, when you need it. You will need a follow up appointment in:  1 years.  Please call our office 2 months in advance to schedule this appointment.  You may see Philip Nahser, MD or one of the following Advanced Practice Providers on your designated Care Team: Scott Weaver, PA-C Vin Bhagat, PA-C . Janine Hammond, NP    

## 2019-07-25 ENCOUNTER — Other Ambulatory Visit: Payer: Self-pay | Admitting: Cardiovascular Disease

## 2019-09-15 ENCOUNTER — Other Ambulatory Visit: Payer: Self-pay | Admitting: Cardiovascular Disease

## 2019-10-06 ENCOUNTER — Other Ambulatory Visit: Payer: Self-pay | Admitting: Cardiovascular Disease

## 2019-11-19 ENCOUNTER — Other Ambulatory Visit: Payer: Self-pay | Admitting: Cardiovascular Disease

## 2020-01-16 ENCOUNTER — Other Ambulatory Visit: Payer: Self-pay | Admitting: Cardiovascular Disease

## 2020-04-15 ENCOUNTER — Other Ambulatory Visit: Payer: Self-pay | Admitting: Cardiovascular Disease

## 2020-04-26 ENCOUNTER — Other Ambulatory Visit: Payer: Self-pay | Admitting: Cardiovascular Disease

## 2020-07-13 ENCOUNTER — Other Ambulatory Visit: Payer: Medicare Other

## 2020-07-13 ENCOUNTER — Ambulatory Visit: Payer: Medicare PPO | Admitting: Cardiovascular Disease

## 2020-07-16 ENCOUNTER — Other Ambulatory Visit: Payer: Self-pay | Admitting: Cardiovascular Disease

## 2020-07-23 ENCOUNTER — Other Ambulatory Visit: Payer: Self-pay | Admitting: Surgery

## 2020-07-29 ENCOUNTER — Other Ambulatory Visit: Payer: Self-pay | Admitting: Cardiovascular Disease

## 2020-08-02 ENCOUNTER — Other Ambulatory Visit: Payer: Self-pay | Admitting: Cardiovascular Disease

## 2020-08-02 ENCOUNTER — Ambulatory Visit: Payer: Medicare PPO | Admitting: Cardiovascular Disease

## 2020-08-02 ENCOUNTER — Other Ambulatory Visit: Payer: Medicare PPO | Admitting: *Deleted

## 2020-08-02 ENCOUNTER — Other Ambulatory Visit: Payer: Self-pay

## 2020-08-02 ENCOUNTER — Encounter: Payer: Self-pay | Admitting: Cardiovascular Disease

## 2020-08-02 VITALS — BP 142/62 | HR 62 | Ht 67.0 in | Wt 188.2 lb

## 2020-08-02 DIAGNOSIS — I10 Essential (primary) hypertension: Secondary | ICD-10-CM | POA: Diagnosis not present

## 2020-08-02 DIAGNOSIS — I2510 Atherosclerotic heart disease of native coronary artery without angina pectoris: Secondary | ICD-10-CM

## 2020-08-02 DIAGNOSIS — E782 Mixed hyperlipidemia: Secondary | ICD-10-CM

## 2020-08-02 LAB — HEPATIC FUNCTION PANEL
ALT: 19 IU/L (ref 0–44)
AST: 24 IU/L (ref 0–40)
Albumin: 4.8 g/dL — ABNORMAL HIGH (ref 3.7–4.7)
Alkaline Phosphatase: 40 IU/L — ABNORMAL LOW (ref 44–121)
Bilirubin Total: 0.5 mg/dL (ref 0.0–1.2)
Bilirubin, Direct: 0.17 mg/dL (ref 0.00–0.40)
Total Protein: 7.5 g/dL (ref 6.0–8.5)

## 2020-08-02 LAB — LIPID PANEL
Chol/HDL Ratio: 2.7 ratio (ref 0.0–5.0)
Cholesterol, Total: 152 mg/dL (ref 100–199)
HDL: 57 mg/dL (ref >39)
LDL Chol Calc (NIH): 66 mg/dL (ref 0–99)
Triglycerides: 173 mg/dL — ABNORMAL HIGH (ref 0–149)
VLDL Cholesterol Cal: 29 mg/dL (ref 5–40)

## 2020-08-02 LAB — BASIC METABOLIC PANEL
BUN/Creatinine Ratio: 11 (ref 10–24)
BUN: 10 mg/dL (ref 8–27)
CO2: 22 mmol/L (ref 20–29)
Calcium: 9.6 mg/dL (ref 8.6–10.2)
Chloride: 102 mmol/L (ref 96–106)
Creatinine, Ser: 0.87 mg/dL (ref 0.76–1.27)
GFR calc Af Amer: 100 mL/min/{1.73_m2} (ref >59)
GFR calc non Af Amer: 86 mL/min/{1.73_m2} (ref >59)
Glucose: 98 mg/dL (ref 65–99)
Potassium: 4.4 mmol/L (ref 3.5–5.2)
Sodium: 138 mmol/L (ref 134–144)

## 2020-08-02 NOTE — Progress Notes (Signed)
Cardiology Office Note   Date:  08/02/2020   ID:  Donald Mckinney, DOB 05-29-1946, MRN [id number]  PCP:  Celene Squibb, MD  Cardiologist:   [name], MD   Chief Complaint  Patient presents with  . Coronary Artery Disease   1. Coronary artery disease-status post anterior wall myocardial infarction (Dec. 5, 2009), status post PTCA and stenting of his right coronary artery and later status post PTCA and stenting of his LAD 2. Hyperlipidemia 3. Hypertension 4. History of peripheral vascular disease-followed by [name] is a 74 year old gentleman with a history of coronary artery disease. He status post PTCA and stenting of his right coronary artery and also status post PTCA and stenting of left anterior descending artery. He also has a history of hypertension and hyperlipidemia. He has a history of severe peripheral vascular disease and is followed by Dr. Trula Slade.  His blood pressure is typically normal at home. He states that he goes up when he comes to the Dr. He has been exercising without cp or claudication.  Feb. 17, 2014:  Donald Mckinney is doing very well. He's not had any episodes of chest pain or shortness of breath. He's had some basal cell cancers removed from his face. He has not been exercising as much as he would like. He is fasting today. He has been keeping a record of his BP - his readings are OK.  Nov. 10, 2014:  Donald Mckinney is doing well. His BP is high today but his BP readings at home are ok. He is playing lots of golf. He is off the niaspan.  Apr 03, 2014:  Donald Mckinney is doing well. He has had some periodontal work. He has been keeping his BP - is getting some low readings.    Nov. 11, 2015:  Donald Mckinney is doing well. No CP or dyspnea. . Playing golf regularly. Still walks 3 times a week .   He brought his BP log - his readings are quite good.    Apr 05, 2015:   Donald Mckinney is a 74 y.o. male who presents for  Follow up of his CAD  BP is  normal at home. A bit elevated here in the office . [name]    No CP   Nov. 14, 2016:  Doing well.   Has been busy picking corn.   No Cp or dyspnea.    Keeps a record of his BP  - all are in the normal range.   Apr 06, 2016:  Doing well.  No CP or dyspnea  Getting some exercise  Nov. 21, 2017:  Donald Mckinney is seen for follow up of his cad Wants a 90 day supply of meds.  No CP or dyspnea   June 06, 2017:  Donald Mckinney is seen for follow up of his CAD Has developed vertigo -  Went to [facility] ER ,   Was given Antivert   No angina .     Jan. 17, 2019:   Doing well from cardiac standpoint Is seeing Dr. Trula Slade for AAA and iliac aneurism  Brought his BP cuff to verify  No CP or dyspnea  Eats a low salt diet   Retired from tobacco  farming and computers   June 19, 2018: Donald Mckinney seen back today for follow-up visit. Recent lab work from Dr. Juel Burrow office reveals a glucose of 104.  Creatinine 0.94.  Liver enzymes are normal.  Electrolytes are normal.  Total cholesterol is 139.  Triglyceride  levels 140.  HDL is 52.  LDL is 59   August 02, 2020: Donald Mckinney is seen today for follow-up of his coronary artery disease and peripheral arterial disease.  He has an abdominal aortic aneurysm and iliac aneurysm.  He has been followed by Dr. Trula Slade.. No CP or dyspnea.  Due for lab work today  BP is normal at home  Past Medical History:  Diagnosis Date  . AAA (abdominal aortic aneurysm) ([place])   . Cancer ([place]) 2005   BCC  . Cancer of skin, face Dec. 2013  and  Jan.  2014   [facility]  Left cheek and nose;  2016  Left Periorbital  . Coronary artery disease   . Hyperlipidemia   . Hypertension   . Leg pain    with walking  . MI, old    INFERIOR WALL M.I.  . Myocardial infarction ([place]) 10/24/2008  . PVD (peripheral vascular disease) ([place])    MODERATE TO SEVERE, INVOLVING THE DISTAL AORTA AND BILATERAL ILIACS    Past Surgical History:  Procedure Laterality Date  . ABDOMINAL AORTOGRAM W/LOWER EXTREMITY  N/A 10/30/2017   Procedure: ABDOMINAL AORTOGRAM W/LOWER EXTREMITY;  Surgeon: Waynetta Sandy, MD;  Location: [facility];  Service: Cardiovascular;  Laterality: N/A;  . CARDIAC CATHETERIZATION     WELL PRESERVED LEFT VENTRICULAR SYSTOLIC FUNCTION. EF 55%  . CORONARY ANGIOPLASTY WITH STENT PLACEMENT     RIGHT CORONARY ARTERY, LEFT ANTERIOR DESCENDING ARTERY  . CORONARY ARTERY BYPASS GRAFT    . iliac artery stenting Bilateral Jan. 7, 2010   Bilateral CIA and Right External IA  stents   . PRE-MALIGNANT / BENIGN SKIN LESION EXCISION Left Oct. 27, 2015   Posterior Neck- Benign mole     Current Outpatient Medications  Medication Sig Dispense Refill  . aspirin 81 MG tablet Take 81 mg by mouth daily.      . carvedilol (COREG) 12.5 MG tablet Take 1 tablet by mouth twice daily 180 tablet 2  . cilostazol (PLETAL) 100 MG tablet Take 1 tablet by mouth twice daily 60 tablet 0  . clopidogrel (PLAVIX) 75 MG tablet Take 1 tablet by mouth once daily 90 tablet 3  . fenofibrate (TRICOR) 145 MG tablet Take 1 tablet by mouth once daily 90 tablet 3  . Isopropyl Alcohol (SWIMMERS EAR DROPS OT) Place 2 drops into both ears as needed (for swimmer ear).    [name] lisinopril (ZESTRIL) 10 MG tablet Take 1 tablet (10 mg total) by mouth daily. Must keep appt for further refills 30 tablet 0  . loratadine (CLARITIN) 10 MG tablet Take 10 mg by mouth daily.    . meclizine (ANTIVERT) 25 MG tablet Take 1 tablet (25 mg total) by mouth 3 (three) times daily as needed for dizziness. 15 tablet 0  . Multiple Vitamin (MULTIVITAMIN) tablet Take 1 tablet by mouth daily.      [name] NITROSTAT 0.4 MG SL tablet DISSOLVE ONE TABLET UNDER THE TONGUE EVERY 5 MINUTES AS NEEDED FOR CHEST PAIN.  DO NOT EXCEED A TOTAL OF 3 DOSES IN 15 MINUTES. NO RELIEF, C 25 tablet 1  . rosuvastatin (CRESTOR) 20 MG tablet Take 1 tablet by mouth once daily 90 tablet 0   No current facility-administered medications for this visit.    Allergies:    Ampicillin and Iodinated diagnostic agents    Social History:  The patient  reports that he quit smoking about 11 years ago. His smoking use included cigarettes. He has never used  smokeless tobacco. He reports current alcohol use. He reports that he does not use drugs.   Family History:  The patient's family history includes Cancer in his father and mother; Hyperlipidemia in his mother.    ROS:  Noted in current hx.  Otherwise negative .   Physical Exam: Blood pressure (!) 142/62, pulse 62, height 5\' 7"  (1.702 m), weight 188 lb 3.2 oz (85.4 kg), SpO2 98 %.  GEN:  Well nourished, well developed in no acute distress HEENT: Normal NECK: No JVD; No carotid bruits LYMPHATICS: No lymphadenopathy CARDIAC: RRR , no murmurs, rubs, gallops RESPIRATORY:  Clear to auscultation without rales, wheezing or rhonchi  ABDOMEN: Soft, non-tender, non-distended MUSCULOSKELETAL:  No edema; No deformity  SKIN: Warm and dry NEUROLOGIC:  Alert and oriented x 3;  EKG: August 02, 2020: Normal sinus rhythm at 63.  No ST or T wave changes.  Recent Labs: No results found for requested labs within last 8760 hours.    Lipid Panel    Component Value Date/Time   CHOL 135 07/08/2019 0857   TRIG 166 (H) 07/08/2019 0857   HDL 52 07/08/2019 0857   CHOLHDL 2.6 07/08/2019 0857   CHOLHDL 2.2 10/10/2016 0921   VLDL 24 10/10/2016 0921   LDLCALC 50 07/08/2019 0857   LDLDIRECT 80.7 10/24/2011 1049      Wt Readings from Last 3 Encounters:  08/02/20 188 lb 3.2 oz (85.4 kg)  07/11/19 195 lb (88.5 kg)  12/19/18 198 lb (89.8 kg)      Other studies Reviewed: Additional studies/ records that were reviewed today include: . Review of the above records demonstrates:    ASSESSMENT AND PLAN:  1. Coronary artery disease-  No angina ,  Cont same meds.    2. Hyperlipidemia:  Check labs today .  Cont same meds.    3. Hypertension-  BP readings at home look good.    4. History of peripheral vascular disease-   Followed by Dr. Trula Slade  Current medicines are reviewed at length with the patient today.  The patient does not have concerns regarding medicines.  The following changes have been made:  no change  Labs/ tests ordered today include:   No orders of the defined types were placed in this encounter.   Disposition:   Follow up in 1 year    [name], MD  08/02/2020 [address], [place], [place]  [address] Phone: [phone]; Fax: [phone]

## 2020-08-02 NOTE — Patient Instructions (Signed)
Medication Instructions:  Your physician recommends that you continue on your current medications as directed. Please refer to the Current Medication list given to you today.  *If you need a refill on your cardiac medications before your next appointment, please call your pharmacy*   Lab Work: Your physician recommends that you return for a FASTING lipid profile, LFTS,and BMET in 1 year prior to your appointment on on the same day as your appointment  If you have labs (blood work) drawn today and your tests are completely normal, you will receive your results only by:  [place] (if you have MyChart) OR  A paper copy in the mail If you have any lab test that is abnormal or we need to change your treatment, we will call you to review the results.   Testing/Procedures: None   Follow-Up: At [facility], you and your health needs are our priority.  As part of our continuing mission to provide you with exceptional heart care, we have created designated Provider Care Teams.  These Care Teams include your primary Cardiologist (physician) and Advanced Practice Providers (APPs -  Physician Assistants and Nurse Practitioners) who all work together to provide you with the care you need, when you need it.  We recommend signing up for the patient portal called "MyChart".  Sign up information is provided on this After Visit Summary.  MyChart is used to connect with patients for Virtual Visits (Telemedicine).  Patients are able to view lab/test results, encounter notes, upcoming appointments, etc.  Non-urgent messages can be sent to your provider as well.   To learn more about what you can do with MyChart, go to [email or web address].    Your next appointment:   12 month(s)  The format for your next appointment:   In Person  Provider:   You may see [name], MD or one of the following Advanced Practice Providers on your designated Care Team:    Richardson Dopp, PA-C  Robbie Lis,  [place]    Other Instructions None

## 2020-09-01 ENCOUNTER — Other Ambulatory Visit: Payer: Self-pay | Admitting: Cardiovascular Disease

## 2020-09-07 ENCOUNTER — Other Ambulatory Visit: Payer: Self-pay | Admitting: Cardiovascular Disease

## 2020-09-27 ENCOUNTER — Other Ambulatory Visit: Payer: Self-pay | Admitting: Cardiovascular Disease

## 2020-10-18 ENCOUNTER — Other Ambulatory Visit: Payer: Self-pay | Admitting: Cardiovascular Disease

## 2021-01-28 DIAGNOSIS — Z23 Encounter for immunization: Secondary | ICD-10-CM | POA: Diagnosis not present

## 2021-02-02 ENCOUNTER — Other Ambulatory Visit: Payer: Self-pay | Admitting: Vascular Surgery

## 2021-03-15 ENCOUNTER — Other Ambulatory Visit: Payer: Self-pay

## 2021-03-15 DIAGNOSIS — Z01818 Encounter for other preprocedural examination: Secondary | ICD-10-CM

## 2021-03-17 ENCOUNTER — Other Ambulatory Visit: Payer: Self-pay

## 2021-03-17 DIAGNOSIS — I712 Thoracic aortic aneurysm, without rupture, unspecified: Secondary | ICD-10-CM

## 2021-03-17 DIAGNOSIS — I714 Abdominal aortic aneurysm, without rupture, unspecified: Secondary | ICD-10-CM

## 2021-03-17 MED ORDER — PREDNISONE 50 MG PO TABS: ORAL_TABLET | ORAL | 0 refills | Status: DC

## 2021-03-17 MED ORDER — DIPHENHYDRAMINE HCL 50 MG PO CAPS: ORAL_CAPSULE | ORAL | 0 refills | Status: DC

## 2021-03-22 ENCOUNTER — Other Ambulatory Visit: Payer: Self-pay

## 2021-03-22 ENCOUNTER — Encounter (HOSPITAL_COMMUNITY): Payer: Self-pay

## 2021-03-22 ENCOUNTER — Ambulatory Visit (HOSPITAL_COMMUNITY): Payer: Medicare PPO

## 2021-03-22 ENCOUNTER — Ambulatory Visit (HOSPITAL_COMMUNITY)
Admission: RE | Admit: 2021-03-22 | Discharge: 2021-03-22 | Disposition: A | Discharge status: Home / Self Care | Payer: Medicare PPO | Source: Ambulatory Visit | Attending: Surgery | Admitting: Surgery

## 2021-03-22 DIAGNOSIS — I712 Thoracic aortic aneurysm, without rupture, unspecified: Secondary | ICD-10-CM

## 2021-03-22 DIAGNOSIS — I714 Abdominal aortic aneurysm, without rupture, unspecified: Secondary | ICD-10-CM

## 2021-03-23 ENCOUNTER — Other Ambulatory Visit: Payer: Self-pay

## 2021-03-23 DIAGNOSIS — I712 Thoracic aortic aneurysm, without rupture, unspecified: Secondary | ICD-10-CM

## 2021-03-23 DIAGNOSIS — I714 Abdominal aortic aneurysm, without rupture, unspecified: Secondary | ICD-10-CM

## 2021-03-28 ENCOUNTER — Encounter: Payer: Self-pay | Admitting: Surgery

## 2021-03-28 ENCOUNTER — Ambulatory Visit (HOSPITAL_COMMUNITY)
Admission: RE | Admit: 2021-03-28 | Discharge: 2021-03-28 | Disposition: A | Discharge status: Home / Self Care | Payer: Medicare PPO | Source: Ambulatory Visit | Attending: Surgery | Admitting: Surgery

## 2021-03-28 ENCOUNTER — Other Ambulatory Visit: Payer: Self-pay

## 2021-03-28 ENCOUNTER — Ambulatory Visit: Payer: Medicare PPO | Admitting: Surgery

## 2021-03-28 VITALS — BP 169/89 | HR 57 | Temp 98.6°F | Resp 20 | Ht 67.0 in | Wt 188.0 lb

## 2021-03-28 DIAGNOSIS — I70213 Atherosclerosis of native arteries of extremities with intermittent claudication, bilateral legs: Secondary | ICD-10-CM | POA: Diagnosis not present

## 2021-03-28 DIAGNOSIS — I714 Abdominal aortic aneurysm, without rupture, unspecified: Secondary | ICD-10-CM

## 2021-03-28 DIAGNOSIS — Z01818 Encounter for other preprocedural examination: Secondary | ICD-10-CM | POA: Diagnosis not present

## 2021-03-28 NOTE — Progress Notes (Signed)
Vascular and Vein Specialist of [place]  Patient name: Donald Mckinney MRN: [id number] DOB: 05-11-1946 Sex: male   REASON FOR VISIT:    Follow up  HISOTRY OF PRESENT ILLNESS:   Donald Gelb Simpsonis a 75 y.o.malewho in 2010 underwent stenting of the right common and external iliac artery as well as the left common iliac artery for rest pain.He is currently taking Pletal daily.  He does not complain of any claudication symptoms.  He suffered an MI in early 2010. This was treated percutaneously. He takes a statin for hypercholesterolemia. His blood pressure was managed with an ACE inhibitor. He is a former smoker.  At his last visit he was found to have elevated velocities within the left common iliac stent as well as the right external iliac stent and was scheduled for angiography which was performed by Dr. [name] on 10-30-2017.  His stents were felt to be widely patent.  There was concern over aneurysmal disease and therefore the patient was scheduled for CT scan which showed a 3.3cm AAA and a 2.8 cm left common iliac artery aneurysm.  He was supposed to get a CT scan however this was canceled while he was at the imaging center because of the possible rash after taking premedication for scan   PAST MEDICAL HISTORY:   Past Medical History:  Diagnosis Date  . AAA (abdominal aortic aneurysm) ([place])   . Cancer ([place]) 2005   BCC  . Cancer of skin, face Dec. 2013  and  Jan.  2014   [facility]  Left cheek and nose;  2016  Left Periorbital  . Coronary artery disease   . Hyperlipidemia   . Hypertension   . Leg pain    with walking  . MI, old    INFERIOR WALL M.I.  . Myocardial infarction ([place]) 10/24/2008  . PVD (peripheral vascular disease) ([place])    MODERATE TO SEVERE, INVOLVING THE DISTAL AORTA AND BILATERAL ILIACS     FAMILY HISTORY:   Family History  Problem Relation Age of Onset  . Cancer Mother   . Hyperlipidemia Mother   . Cancer Father      SOCIAL HISTORY:   Social History   Tobacco Use  . Smoking status: Former Smoker    Types: Cigarettes    Quit date: 10/20/2008    Years since quitting: 12.4  . Smokeless tobacco: Never Used  Substance Use Topics  . Alcohol use: Yes    Comment: states drinking 2-3 beers a day     ALLERGIES:   Allergies  Allergen Reactions  . Ampicillin Other (See Comments)    Developed prolonged diarrhea - may have developed C. Diff colitis.  Has patient had a PCN reaction causing immediate rash, facial/tongue/throat swelling, SOB or lightheadedness with hypotension: No Has patient had a PCN reaction causing severe rash involving mucus membranes or skin necrosis: No Has patient had a PCN reaction that required hospitalization: No Has patient had a PCN reaction occurring within the last 10 years: No If all of the above answers are "NO", then may proceed with Cephalospori  . Iodinated Diagnostic Agents Itching and Rash     CURRENT MEDICATIONS:   Current Outpatient Medications  Medication Sig Dispense Refill  . aspirin 81 MG tablet Take 81 mg by mouth daily.      . carvedilol (COREG) 12.5 MG tablet Take 1 tablet by mouth twice daily 180 tablet 3  . cilostazol (PLETAL) 100 MG tablet Take 1 tablet by mouth twice daily 60  tablet 0  . clopidogrel (PLAVIX) 75 MG tablet Take 1 tablet by mouth once daily 90 tablet 3  . diphenhydrAMINE (BENADRYL) 50 MG capsule Take 50 mg by mouth 1 hour prior to your procedure. 1 capsule 0  . fenofibrate (TRICOR) 145 MG tablet Take 1 tablet by mouth once daily 90 tablet 3  . Isopropyl Alcohol (SWIMMERS EAR DROPS OT) Place 2 drops into both ears as needed (for swimmer ear).    [name] lisinopril (ZESTRIL) 10 MG tablet Take 1 tablet (10 mg total) by mouth daily. 90 tablet 3  . loratadine (CLARITIN) 10 MG tablet Take 10 mg by mouth daily.    . meclizine (ANTIVERT) 25 MG tablet Take 1 tablet (25 mg total) by mouth 3 (three) times daily as needed for dizziness. 15 tablet 0   . Multiple Vitamin (MULTIVITAMIN) tablet Take 1 tablet by mouth daily.      [name] NITROSTAT 0.4 MG SL tablet DISSOLVE ONE TABLET UNDER THE TONGUE EVERY 5 MINUTES AS NEEDED FOR CHEST PAIN.  DO NOT EXCEED A TOTAL OF 3 DOSES IN 15 MINUTES. NO RELIEF, C 25 tablet 1  . predniSONE (DELTASONE) 50 MG tablet One tablet (50mg ) 13 hours prior to procedure; one tablet (50mg ) 7 hours prior to procedure and then one tablet (50 mg) one hour prior to procedure. 3 tablet 0  . rosuvastatin (CRESTOR) 20 MG tablet Take 1 tablet by mouth once daily 90 tablet 3   No current facility-administered medications for this visit.    REVIEW OF SYSTEMS:   [X]  denotes positive finding, [ ]  denotes negative finding Cardiac  Comments:  Chest pain or chest pressure:    Shortness of breath upon exertion:    Short of breath when lying flat:    Irregular heart rhythm:        Vascular    Pain in calf, thigh, or hip brought on by ambulation:    Pain in feet at night that wakes you up from your sleep:     Blood clot in your veins:    Leg swelling:         Pulmonary    Oxygen at home:    Productive cough:     Wheezing:         Neurologic    Sudden weakness in arms or legs:     Sudden numbness in arms or legs:     Sudden onset of difficulty speaking or slurred speech:    Temporary loss of vision in one eye:     Problems with dizziness:         Gastrointestinal    Blood in stool:     Vomited blood:         Genitourinary    Burning when urinating:     Blood in urine:        Psychiatric    Major depression:         Hematologic    Bleeding problems:    Problems with blood clotting too easily:        Skin    Rashes or ulcers:        Constitutional    Fever or chills:      PHYSICAL EXAM:   There were no vitals filed for this visit.  GENERAL: The patient is a well-nourished male, in no acute distress. The vital signs are documented above. CARDIAC: There is a regular rate and rhythm.  VASCULAR: Nonpalpable  pedal pulses PULMONARY: Non-labored respirations  MUSCULOSKELETAL: There  are no major deformities or cyanosis. NEUROLOGIC: No focal weakness or paresthesias are detected. SKIN: There are no ulcers or rashes noted. PSYCHIATRIC: The patient has a normal affect.  STUDIES:   I have reviewed the following carotid duplex results: Right = normal Left = 1-39%  MEDICAL ISSUES:   Carotid: Essentially normal duplex, I will plan on repeating his scan in 2024.  Aortoiliac aneurysmal disease: The patient did not get a CT scan because of a possible reaction to his premedication for his contrast allergy.  I am going to send him back for a noncontrasted CT scan.  PAD: Symptoms are stable.  He will get an aortoiliac duplex and ABIs in 1 year.    Leia Alf, MD, FACS [facility] Ve[facility] Specialists of [facility] [phone] Pager [phone]

## 2021-03-30 ENCOUNTER — Other Ambulatory Visit: Payer: Self-pay

## 2021-03-30 DIAGNOSIS — I712 Thoracic aortic aneurysm, without rupture, unspecified: Secondary | ICD-10-CM

## 2021-03-30 DIAGNOSIS — I714 Abdominal aortic aneurysm, without rupture, unspecified: Secondary | ICD-10-CM

## 2021-04-06 ENCOUNTER — Other Ambulatory Visit: Payer: Self-pay | Admitting: Vascular Surgery

## 2021-04-11 ENCOUNTER — Ambulatory Visit (INDEPENDENT_AMBULATORY_CARE_PROVIDER_SITE_OTHER): Payer: Medicare PPO | Admitting: Surgery

## 2021-04-11 ENCOUNTER — Ambulatory Visit
Admission: RE | Admit: 2021-04-11 | Discharge: 2021-04-11 | Disposition: A | Discharge status: Home / Self Care | Payer: Medicare PPO | Source: Ambulatory Visit | Attending: Surgery | Admitting: Surgery

## 2021-04-11 ENCOUNTER — Other Ambulatory Visit: Payer: Self-pay

## 2021-04-11 DIAGNOSIS — I714 Abdominal aortic aneurysm, without rupture, unspecified: Secondary | ICD-10-CM

## 2021-04-11 DIAGNOSIS — K5730 Diverticulosis of large intestine without perforation or abscess without bleeding: Secondary | ICD-10-CM | POA: Diagnosis not present

## 2021-04-11 DIAGNOSIS — I712 Thoracic aortic aneurysm, without rupture, unspecified: Secondary | ICD-10-CM

## 2021-04-11 NOTE — Progress Notes (Signed)
Vascular and Vein Specialist of [place]  Patient name: Donald Mckinney MRN: [id number] DOB: 1946/10/20 Sex: male      Virtual Visit via Telephone Note   This visit type was conducted due to national recommendations for restrictions regarding the COVID-19 Pandemic (e.g. social distancing) in an effort to limit this patient's exposure and mitigate transmission in our community.  Due to his co-morbid illnesses, this patient is at least at moderate risk for complications without adequate follow up.  This format is felt to be most appropriate for this patient at this time.  The patient did not have access to video technology/had technical difficulties with video requiring transitioning to audio format only (telephone).  All issues noted in this document were discussed and addressed.  No physical exam could be performed with this format.    Patient Location: Home Provider Location: Office/Clinic    REASON FOR APPOINTMENT:    Follow up  HISTORY OF PRESENT ILLNESS:    Donald Mckinney a 75 y.o.malewho in 2010 underwent stenting of the right common and external iliac artery as well as the left common iliac artery for rest pain.He is currently taking Pletal daily.  He does not complain of any claudication symptoms.  He suffered an MI in early 2010. This was treated percutaneously. He takes a statin for hypercholesterolemia. His blood pressure was managed with an ACE inhibitor. He is a former smoker.  At his last visit he was found to have elevated velocities within the left common iliac stent as well as the right external iliac stent and was scheduled for angiography which was performed by Dr. [name] on 10-30-2017. His stents were felt to be widely patent. There was concern over aneurysmal disease and therefore the patient was scheduled for CT scan which showed a 3.3cm AAA and a 2.8 cm left common iliac artery aneurysm.  He was supposed to get a CT scan however this was  canceled while he was at the imaging center because of the possible rash after taking premedication for scan.  Therefore I sent him back for a noncontrasted scan for which we are going to review today.   The patient does not have symptoms concerning for COVID-19 infection (fever, chills, cough, or new shortness of breath).   PAST MEDICAL HISTORY    Past Medical History:  Diagnosis Date  . AAA (abdominal aortic aneurysm) ([place])   . Cancer ([place]) 2005   BCC  . Cancer of skin, face Dec. 2013  and  Jan.  2014   [facility]  Left cheek and nose;  2016  Left Periorbital  . Coronary artery disease   . Hyperlipidemia   . Hypertension   . Leg pain    with walking  . MI, old    INFERIOR WALL M.I.  . Myocardial infarction ([place]) 10/24/2008  . PVD (peripheral vascular disease) ([facility])    MODERATE TO SEVERE, INVOLVING THE DISTAL AORTA AND BILATERAL ILIACS     FAMILY HISTORY   Family History  Problem Relation Age of Onset  . Cancer Mother   . Hyperlipidemia Mother   . Cancer Father     SOCIAL HISTORY:   Social History   Socioeconomic History  . Marital status: Married    Spouse name: Not on file  . Number of children: Not on file  . Years of education: Not on file  . Highest education level: Not on file  Occupational History  .  Not on file  Tobacco Use  . Smoking status: Former Smoker    Types: Cigarettes    Quit date: 10/20/2008    Years since quitting: 12.4  . Smokeless tobacco: Never Used  Vaping Use  . Vaping Use: Never used  Substance and Sexual Activity  . Alcohol use: Yes    Comment: states drinking 2-3 beers a day  . Drug use: No  . Sexual activity: Not on file  Other Topics Concern  . Not on file  Social History Narrative  . Not on file   Social Determinants of Health   Financial Resource Strain: Not on file  Food Insecurity: Not on file  Transportation Needs: Not on file  Physical Activity: Not on file  Stress: Not on file  Social Connections: Not on file   Intimate Partner Violence: Not on file    ALLERGIES:    Allergies  Allergen Reactions  . Ampicillin Other (See Comments)    Developed prolonged diarrhea - may have developed C. Diff colitis.  Has patient had a PCN reaction causing immediate rash, facial/tongue/throat swelling, SOB or lightheadedness with hypotension: No Has patient had a PCN reaction causing severe rash involving mucus membranes or skin necrosis: No Has patient had a PCN reaction that required hospitalization: No Has patient had a PCN reaction occurring within the last 10 years: No If all of the above answers are "NO", then may proceed with Cephalospori  . Iodinated Diagnostic Agents Itching and Rash    CURRENT MEDICATIONS:    Current Outpatient Medications  Medication Sig Dispense Refill  . aspirin 81 MG tablet Take 81 mg by mouth daily.    . carvedilol (COREG) 12.5 MG tablet Take 1 tablet by mouth twice daily 180 tablet 3  . cilostazol (PLETAL) 100 MG tablet Take 1 tablet by mouth twice daily 60 tablet 0  . clopidogrel (PLAVIX) 75 MG tablet Take 1 tablet by mouth once daily 90 tablet 3  . diphenhydrAMINE (BENADRYL) 50 MG capsule Take 50 mg by mouth 1 hour prior to your procedure. (Patient not taking: Reported on 03/28/2021) 1 capsule 0  . fenofibrate (TRICOR) 145 MG tablet Take 1 tablet by mouth once daily 90 tablet 3  . Isopropyl Alcohol (SWIMMERS EAR DROPS OT) Place 2 drops into both ears as needed (for swimmer ear).    [name] lisinopril (ZESTRIL) 10 MG tablet Take 1 tablet (10 mg total) by mouth daily. 90 tablet 3  . loratadine (CLARITIN) 10 MG tablet Take 10 mg by mouth daily.    . meclizine (ANTIVERT) 25 MG tablet Take 1 tablet (25 mg total) by mouth 3 (three) times daily as needed for dizziness. 15 tablet 0  . Multiple Vitamin (MULTIVITAMIN) tablet Take 1 tablet by mouth daily.    [name] NITROSTAT 0.4 MG SL tablet DISSOLVE ONE TABLET UNDER THE TONGUE EVERY 5 MINUTES AS NEEDED FOR CHEST PAIN.  DO NOT EXCEED A TOTAL OF  3 DOSES IN 15 MINUTES. NO RELIEF, C 25 tablet 1  . predniSONE (DELTASONE) 50 MG tablet One tablet (50mg ) 13 hours prior to procedure; one tablet (50mg ) 7 hours prior to procedure and then one tablet (50 mg) one hour prior to procedure. (Patient not taking: Reported on 03/28/2021) 3 tablet 0  . rosuvastatin (CRESTOR) 20 MG tablet Take 1 tablet by mouth once daily 90 tablet 3   No current facility-administered medications for this visit.    REVIEW OF SYSTEMS:   Please see the history of present illness.  All other systems reviewed and are negative.  PHYSICAL EXAM:   Deferred for virtual visit  Recent Labs: 08/02/2020: ALT 19; BUN 10; Creatinine, Ser 0.87; Potassium 4.4; Sodium 138   Recent Lipid Panel Lab Results  Component Value Date/Time   CHOL 152 08/02/2020 09:38 AM   TRIG 173 (H) 08/02/2020 09:38 AM   HDL 57 08/02/2020 09:38 AM   CHOLHDL 2.7 08/02/2020 09:38 AM   CHOLHDL 2.2 10/10/2016 09:21 AM   LDLCALC 66 08/02/2020 09:38 AM   LDLDIRECT 80.7 10/24/2011 10:49 AM    Wt Readings from Last 3 Encounters:  03/28/21 188 lb (85.3 kg)  08/02/20 188 lb 3.2 oz (85.4 kg)  07/11/19 195 lb (88.5 kg)     STUDIES:   I have reviewed his CT scan.  It has not officially been read by radiology.  The abdominal aneurysm appears to be minimally changed at about 3.1 cm. There appears to be progression of the left iliac aneurysm, now greater than 3 cm  ASSESSMENT and PLAN   Carotid: Essentially normal duplex, I will plan on repeating his scan in 2024.  Aortoiliac aneurysmal disease: It appears that there has been some slight enlargement of the left iliac aneurysm.  I am going to bring him back in 6 months for repeat scan, this time with contrast at [facility] with premedication  PAD: Symptoms are stable.  He will get an aortoiliac duplex and ABIs in 1 year.    Time:   Today, I have spent 5 minutes with the patient with telehealth technology discussing the above problems.       Leia Alf, MD, FACS [facility] and Vein Specialists of [facility] [phone] Pager [phone]

## 2021-04-14 ENCOUNTER — Other Ambulatory Visit: Payer: Self-pay | Admitting: *Deleted

## 2021-06-11 ENCOUNTER — Other Ambulatory Visit: Payer: Self-pay | Admitting: Cardiovascular Disease

## 2021-07-07 ENCOUNTER — Other Ambulatory Visit: Payer: Self-pay

## 2021-07-07 MED ORDER — CILOSTAZOL 100 MG PO TABS: 100.0000 mg | ORAL_TABLET | Freq: Two times a day (BID) | ORAL | 0 refills | Status: DC

## 2021-07-12 ENCOUNTER — Other Ambulatory Visit: Payer: Self-pay

## 2021-07-12 MED ORDER — CILOSTAZOL 100 MG PO TABS: 100.0000 mg | ORAL_TABLET | Freq: Two times a day (BID) | ORAL | 0 refills | Status: DC

## 2021-08-03 ENCOUNTER — Other Ambulatory Visit: Payer: Self-pay

## 2021-08-03 ENCOUNTER — Ambulatory Visit: Payer: Medicare PPO | Admitting: Cardiovascular Disease

## 2021-08-03 ENCOUNTER — Encounter: Payer: Self-pay | Admitting: Cardiovascular Disease

## 2021-08-03 VITALS — BP 122/72 | HR 64 | Ht 67.0 in | Wt 185.2 lb

## 2021-08-03 DIAGNOSIS — I10 Essential (primary) hypertension: Secondary | ICD-10-CM

## 2021-08-03 DIAGNOSIS — E785 Hyperlipidemia, unspecified: Secondary | ICD-10-CM | POA: Diagnosis not present

## 2021-08-03 DIAGNOSIS — I2510 Atherosclerotic heart disease of native coronary artery without angina pectoris: Secondary | ICD-10-CM | POA: Diagnosis not present

## 2021-08-03 LAB — LIPID PANEL
Chol/HDL Ratio: 2.8 ratio (ref 0.0–5.0)
Cholesterol, Total: 157 mg/dL (ref 100–199)
HDL: 56 mg/dL (ref >39)
LDL Chol Calc (NIH): 75 mg/dL (ref 0–99)
Triglycerides: 152 mg/dL — ABNORMAL HIGH (ref 0–149)
VLDL Cholesterol Cal: 26 mg/dL (ref 5–40)

## 2021-08-03 LAB — BASIC METABOLIC PANEL
BUN/Creatinine Ratio: 12 (ref 10–24)
BUN: 10 mg/dL (ref 8–27)
CO2: 23 mmol/L (ref 20–29)
Calcium: 10 mg/dL (ref 8.6–10.2)
Chloride: 103 mmol/L (ref 96–106)
Creatinine, Ser: 0.86 mg/dL (ref 0.76–1.27)
Glucose: 98 mg/dL (ref 65–99)
Potassium: 4.5 mmol/L (ref 3.5–5.2)
Sodium: 141 mmol/L (ref 134–144)
eGFR: 91 mL/min/{1.73_m2} (ref >59)

## 2021-08-03 LAB — HEPATIC FUNCTION PANEL
ALT: 17 IU/L (ref 0–44)
AST: 21 IU/L (ref 0–40)
Albumin: 4.9 g/dL — ABNORMAL HIGH (ref 3.7–4.7)
Alkaline Phosphatase: 41 IU/L — ABNORMAL LOW (ref 44–121)
Bilirubin Total: 0.4 mg/dL (ref 0.0–1.2)
Bilirubin, Direct: 0.17 mg/dL (ref 0.00–0.40)
Total Protein: 7.4 g/dL (ref 6.0–8.5)

## 2021-08-03 NOTE — Progress Notes (Signed)
Cardiology Office Note   Date:  08/03/2021   ID:  Donald Mckinney, DOB 1946/03/10, MRN [id number]  PCP:  Celene Squibb, MD  Cardiologist:   Donald Moores, MD   Chief Complaint  Patient presents with   Coronary Artery Disease   1. Coronary artery disease-status post anterior wall myocardial infarction (Dec. 5, 2009), status post PTCA and stenting of his right coronary artery and later status post PTCA and stenting of his LAD 2. Hyperlipidemia 3. Hypertension 4. History of peripheral vascular disease-followed by [name] is a 75 year old gentleman with a history of coronary artery disease. He status post PTCA and stenting of his right coronary artery and also status post PTCA and stenting of left anterior descending artery. He also has a history of hypertension and hyperlipidemia. He has a history of severe peripheral vascular disease and is followed by Dr. Trula Slade.  His blood pressure is typically normal at home. He states that he goes up when he comes to the Dr.  He has been exercising without cp or claudication.  Feb. 17, 2014:  Donald Mckinney is doing very well. He's not had any episodes of chest pain or shortness of breath. He's had some basal cell cancers removed from his face.  He has not been exercising as much as he would like.  He is fasting today.  He has been keeping a record of his BP - his readings are OK.  Nov. 10, 2014:  Donald Mckinney is doing well.  His BP is high today but his BP readings at home are ok.  He is playing lots of golf.    He is off the niaspan.  Apr 03, 2014:  Donald Mckinney is doing well.  He has had some periodontal work.   He has been keeping his BP - is getting some low readings.    Nov. 11, 2015:  Donald Mckinney is doing well.  No CP or dyspnea.  .  Playing golf regularly. Still walks 3 times a week .   He brought his BP log - his readings are quite good.    Apr 05, 2015:   Donald Mckinney is a 75 y.o. male who presents for  Follow up of his CAD  BP is  normal at home. A bit elevated here in the office . [name]    No CP   Nov. 14, 2016:  Doing well.   Has been busy picking corn.   No Cp or dyspnea.    Keeps a record of his BP  - all are in the normal range.   Apr 06, 2016:  Doing well.  No CP or dyspnea  Getting some exercise  Nov. 21, 2017:  Donald Mckinney is seen for follow up of his cad Wants a 90 day supply of meds.  No CP or dyspnea   June 06, 2017:  Donald Mckinney is seen for follow up of his CAD Has developed vertigo -  Went to [facility] ,   Was given Antivert   No angina .     Jan. 17, 2019:   Doing well from cardiac standpoint Is seeing Dr. Trula Slade for AAA and iliac aneurism  Brought his BP cuff to verify  No CP or dyspnea  Eats a low salt diet   Retired from tobacco  farming and computers   June 19, 2018: Keyler seen back today for follow-up visit. Recent lab work from Dr. Juel Burrow office reveals a glucose of 104.  Creatinine 0.94.  Liver enzymes are normal.  Electrolytes are normal.  Total cholesterol is 139.  Triglyceride levels 140.  HDL is 52.  LDL is 59   August 02, 2020: Donald Mckinney is seen today for follow-up of his coronary artery disease and peripheral arterial disease.  He has an abdominal aortic aneurysm and iliac aneurysm.  He has been followed by Dr. Trula Slade.. No CP or dyspnea.  Due for lab work today  BP is normal at home  August 03, 2021: Donald Mckinney is seen today for follow-up of his hypertension, coronary artery disease and hyperlipidemia. Had a good garden this past year.   Past Medical History:  Diagnosis Date   AAA (abdominal aortic aneurysm) ([place])    Cancer ([place]) 2005   [place]   Cancer of skin, face Dec. 2013  and  Jan.  2014   [facility]  Left cheek and nose;  2016  Left Periorbital   Coronary artery disease    Hyperlipidemia    Hypertension    Leg pain    with walking   MI, old    INFERIOR WALL M.I.   Myocardial infarction ([place]) 10/24/2008   PVD (peripheral vascular disease) ([place])    MODERATE TO  SEVERE, INVOLVING THE DISTAL AORTA AND BILATERAL ILIACS    Past Surgical History:  Procedure Laterality Date   ABDOMINAL AORTOGRAM W/LOWER EXTREMITY N/A 10/30/2017   Procedure: ABDOMINAL AORTOGRAM W/LOWER EXTREMITY;  Surgeon: Waynetta Sandy, MD;  Location: [facility];  Service: Cardiovascular;  Laterality: N/A;   CARDIAC CATHETERIZATION     WELL PRESERVED LEFT VENTRICULAR SYSTOLIC FUNCTION. EF 55%   CORONARY ANGIOPLASTY WITH STENT PLACEMENT     RIGHT CORONARY ARTERY, LEFT ANTERIOR DESCENDING ARTERY   CORONARY ARTERY BYPASS GRAFT     iliac artery stenting Bilateral Jan. 7, 2010   Bilateral CIA and Right External IA  stents    PRE-MALIGNANT / BENIGN SKIN LESION EXCISION Left Oct. 27, 2015   Posterior Neck- Benign mole     Current Outpatient Medications  Medication Sig Dispense Refill   carvedilol (COREG) 12.5 MG tablet Take 1 tablet by mouth twice daily 180 tablet 3   cilostazol (PLETAL) 100 MG tablet Take 1 tablet (100 mg total) by mouth 2 (two) times daily. 180 tablet 0   clopidogrel (PLAVIX) 75 MG tablet Take 1 tablet by mouth once daily 30 tablet 0   diphenhydrAMINE (BENADRYL) 50 MG capsule Take 50 mg by mouth 1 hour prior to your procedure. 1 capsule 0   fenofibrate (TRICOR) 145 MG tablet Take 1 tablet by mouth once daily 90 tablet 3   Isopropyl Alcohol (SWIMMERS EAR DROPS OT) Place 2 drops into both ears as needed (for swimmer ear).     lisinopril (ZESTRIL) 10 MG tablet Take 1 tablet (10 mg total) by mouth daily. 90 tablet 3   loratadine (CLARITIN) 10 MG tablet Take 10 mg by mouth daily.     meclizine (ANTIVERT) 25 MG tablet Take 1 tablet (25 mg total) by mouth 3 (three) times daily as needed for dizziness. 15 tablet 0   Multiple Vitamin (MULTIVITAMIN) tablet Take 1 tablet by mouth daily.     NITROSTAT 0.4 MG SL tablet DISSOLVE ONE TABLET UNDER THE TONGUE EVERY 5 MINUTES AS NEEDED FOR CHEST PAIN.  DO NOT EXCEED A TOTAL OF 3 DOSES IN 15 MINUTES. NO RELIEF, C 25  tablet 1   predniSONE (DELTASONE) 50 MG tablet One tablet ('50mg'$ ) 13 hours prior to procedure; one tablet ('50mg'$ ) 7 hours prior  to procedure and then one tablet (50 mg) one hour prior to procedure. 3 tablet 0   rosuvastatin (CRESTOR) 20 MG tablet Take 1 tablet by mouth once daily 90 tablet 3   No current facility-administered medications for this visit.    Allergies:   Ampicillin and Iodinated diagnostic agents    Social History:  The patient  reports that he quit smoking about 12 years ago. His smoking use included cigarettes. He has never used smokeless tobacco. He reports current alcohol use. He reports that he does not use drugs.   Family History:  The patient's family history includes Cancer in his father and mother; Hyperlipidemia in his mother.    ROS:  Noted in current hx.  Otherwise negative .   Physical Exam: Blood pressure 122/72, pulse 64, height '5\' 7"'$  (1.702 m), weight 185 lb 3.2 oz (84 kg), SpO2 96 %.  GEN:  Well nourished, well developed in no acute distress HEENT: Normal NECK: No JVD; No carotid bruits LYMPHATICS: No lymphadenopathy CARDIAC: RRR , no murmurs, rubs, gallops RESPIRATORY:  Clear to auscultation without rales, wheezing or rhonchi  ABDOMEN: Soft, non-tender, non-distended MUSCULOSKELETAL:  No edema; No deformity  SKIN: Warm and dry NEUROLOGIC:  Alert and oriented x 3   EKG: August 03, 2021: Normal sinus rhythm at 64.  No ST or T wave changes.  Recent Labs: No results found for requested labs within last 8760 hours.    Lipid Panel    Component Value Date/Time   CHOL 152 08/02/2020 0938   TRIG 173 (H) 08/02/2020 0938   HDL 57 08/02/2020 0938   CHOLHDL 2.7 08/02/2020 0938   CHOLHDL 2.2 10/10/2016 0921   VLDL 24 10/10/2016 0921   LDLCALC 66 08/02/2020 0938   LDLDIRECT 80.7 10/24/2011 1049      Wt Readings from Last 3 Encounters:  08/03/21 185 lb 3.2 oz (84 kg)  03/28/21 188 lb (85.3 kg)  08/02/20 188 lb 3.2 oz (85.4 kg)      Other  studies Reviewed: Additional studies/ records that were reviewed today include: . Review of the above records demonstrates:    ASSESSMENT AND PLAN:  1. Coronary artery disease-    no angina His stent was placed in 2009.   He was on ASA and Plavix Will DC ASA and continue plavix    2. Hyperlipidemia:   .check labs today    3. Hypertension-    BP is well controlled   4. History of peripheral vascular disease-   has been see nby Dr. Trula Slade   Current medicines are reviewed at length with the patient today.  The patient does not have concerns regarding medicines.  The following changes have been made:  no change  Labs/ tests ordered today include:   Orders Placed This Encounter  Procedures   Lipid Profile   Basic Metabolic Panel (BMET)   Hepatic function panel   EKG 12-Lead     Disposition:   Follow up in 1 year    Donald Moores, MD  08/03/2021 8:48 AM    [address], [place], [place]  [id number] Phone: [phone]; Fax: [phone]

## 2021-08-03 NOTE — Patient Instructions (Signed)
Medication Instructions:  Your physician has recommended you make the following change in your medication:  STOP Aspirin  *If you need a refill on your cardiac medications before your next appointment, please call your pharmacy*   Lab Work: TODAY - cholesterol, liver panel, basic metabolic panel If you have labs (blood work) drawn today and your tests are completely normal, you will receive your results only by: [place] (if you have MyChart) OR A paper copy in the mail If you have any lab test that is abnormal or we need to change your treatment, we will call you to review the results.   Testing/Procedures: None Ordered   Follow-Up: At [facility], [place], you and your health needs are our priority.  As part of our continuing mission to provide you with exceptional heart care, we have created designated Provider Care Teams.  These Care Teams include your primary Cardiologist (physician) and Advanced Practice Providers (APPs -  Physician Assistants and Nurse Practitioners) who all work together to provide you with the care you need, when you need it.    Your next appointment:   1 year(s)  The format for your next appointment:   In Person  Provider:   You may see [name], MD or one of the following Advanced Practice Providers on your designated Care Team:   Richardson Dopp, PA-C [place], [place]

## 2021-08-05 ENCOUNTER — Other Ambulatory Visit: Payer: Self-pay | Admitting: Cardiovascular Disease

## 2021-08-26 ENCOUNTER — Other Ambulatory Visit: Payer: Self-pay

## 2021-08-26 DIAGNOSIS — I714 Abdominal aortic aneurysm, without rupture, unspecified: Secondary | ICD-10-CM

## 2021-08-29 ENCOUNTER — Telehealth: Payer: Self-pay

## 2021-08-29 NOTE — Telephone Encounter (Signed)
-----   Message from Cathlean Cower sent at 08/26/2021  4:37 PM EDT ----- Regarding: RE: CTA 08/26/21 - left msg on vm     ad ----- Message ----- From: Kaleen Mask, LPN Sent: 41/02/4359   3:35 PM EDT To: April H Pait, Jillyn Hidden, # Subject: CTA                                            Please schedule prior to appt on 10/17/21.  Patient says he cannot do 10/04/21.  Thanks.

## 2021-08-30 ENCOUNTER — Other Ambulatory Visit: Payer: Self-pay | Admitting: *Deleted

## 2021-08-30 ENCOUNTER — Telehealth: Payer: Self-pay

## 2021-08-30 DIAGNOSIS — I739 Peripheral vascular disease, unspecified: Secondary | ICD-10-CM

## 2021-08-30 MED ORDER — PREDNISONE 50 MG PO TABS: ORAL_TABLET | ORAL | 0 refills | Status: DC

## 2021-08-30 NOTE — Telephone Encounter (Signed)
-----   Message from April H Pait sent at 08/29/2021  4:09 PM EDT ----- Regarding: FW: CTA  ----- Message ----- From: Maryann Conners, NT Sent: 08/29/2021   3:36 PM EDT To: April H Pait, Roosvelt Maser, # Subject: RE: CTA                                        FYI.[name]  08/29/21. pt stated that he would contact the office back later this week this to have appt sched.Ellison Hughs ----- Message ----- From: Belva Chimes H Sent: 08/26/2021   4:11 PM EDT To: Maryann Conners, NT Subject: FW: CTA                                         ----- Message ----- From: Kaleen Mask, LPN Sent: 26/06/3418   3:35 PM EDT To: April H Pait, Jillyn Hidden, # Subject: CTA                                            Please schedule prior to appt on 10/17/21.  Patient says he cannot do 10/04/21.  Thanks.

## 2021-08-31 ENCOUNTER — Other Ambulatory Visit: Payer: Self-pay

## 2021-08-31 DIAGNOSIS — I739 Peripheral vascular disease, unspecified: Secondary | ICD-10-CM

## 2021-08-31 MED ORDER — PREDNISONE 50 MG PO TABS: ORAL_TABLET | ORAL | 0 refills | Status: DC

## 2021-09-07 ENCOUNTER — Other Ambulatory Visit: Payer: Self-pay

## 2021-09-07 ENCOUNTER — Ambulatory Visit (HOSPITAL_COMMUNITY)
Admission: RE | Admit: 2021-09-07 | Discharge: 2021-09-07 | Disposition: A | Discharge status: Home / Self Care | Payer: Medicare PPO | Source: Ambulatory Visit | Attending: Surgery | Admitting: Surgery

## 2021-09-07 DIAGNOSIS — I714 Abdominal aortic aneurysm, without rupture, unspecified: Secondary | ICD-10-CM | POA: Insufficient documentation

## 2021-09-07 DIAGNOSIS — I7143 Infrarenal abdominal aortic aneurysm, without rupture: Secondary | ICD-10-CM | POA: Diagnosis not present

## 2021-09-07 DIAGNOSIS — K5730 Diverticulosis of large intestine without perforation or abscess without bleeding: Secondary | ICD-10-CM | POA: Diagnosis not present

## 2021-09-07 MED ORDER — IOHEXOL 350 MG/ML SOLN
60.0000 mL | Freq: Once | INTRAVENOUS | Status: AC | PRN
  Administered 2021-09-07: 60 mL via INTRAVENOUS

## 2021-09-07 NOTE — Progress Notes (Addendum)
Patient here today for Ct scan abdomen/pelvis. Patient has contrast allergy and took prednisone x 3 doses for 13 hr prep however did not take benadryl. Patient has benadryl with him and per ct supervisor can take 50 mg and we will scan within 30 min.

## 2021-09-15 ENCOUNTER — Other Ambulatory Visit: Payer: Self-pay | Admitting: Cardiovascular Disease

## 2021-10-05 ENCOUNTER — Other Ambulatory Visit: Payer: Self-pay | Admitting: Cardiovascular Disease

## 2021-10-07 ENCOUNTER — Telehealth: Payer: Self-pay

## 2021-10-07 NOTE — Telephone Encounter (Signed)
**Note De-Identified Donald Mckinney Obfuscation** Clopidogrel PA start through covermymeds. Key: [id number]

## 2021-10-10 NOTE — Telephone Encounter (Signed)
**Note De-Identified Ardis Lawley Obfuscation** Letter received from [facility] Valisa Karpel fax stating that they have approved the pts Clopidogrel PA until 11/19/2022.  I have notified [facility] in [place] of this approval.

## 2021-10-11 ENCOUNTER — Other Ambulatory Visit: Payer: Self-pay | Admitting: *Deleted

## 2021-10-11 MED ORDER — NITROSTAT 0.4 MG SL SUBL: SUBLINGUAL_TABLET | SUBLINGUAL | 1 refills | Status: DC

## 2021-10-17 ENCOUNTER — Encounter: Payer: Self-pay | Admitting: Surgery

## 2021-10-17 ENCOUNTER — Ambulatory Visit: Payer: Medicare PPO | Admitting: Surgery

## 2021-10-17 VITALS — BP 137/72 | HR 78 | Temp 98.4°F | Resp 20 | Ht 67.0 in | Wt 184.0 lb

## 2021-10-17 DIAGNOSIS — I70213 Atherosclerosis of native arteries of extremities with intermittent claudication, bilateral legs: Secondary | ICD-10-CM | POA: Diagnosis not present

## 2021-10-17 DIAGNOSIS — I7143 Infrarenal abdominal aortic aneurysm, without rupture: Secondary | ICD-10-CM | POA: Diagnosis not present

## 2021-10-17 DIAGNOSIS — I723 Aneurysm of iliac artery: Secondary | ICD-10-CM | POA: Diagnosis not present

## 2021-10-17 NOTE — Progress Notes (Signed)
[facility] [place]  Patient name: Donald Mckinney MRN: [id number] DOB: 07/01/1946 Sex: male   REASON FOR VISIT:    Follow up  HISOTRY OF PRESENT ILLNESS:   Donald Mckinney is a 75 y.o. male who in 2010 underwent stenting of the right common and external iliac artery as well as the left common iliac artery for rest pain.He is currently taking Pletal daily.   He does not complain of any claudication symptoms.   He suffered an MI in early 2010. This was treated percutaneously.  He takes a statin for hypercholesterolemia.  His blood pressure was managed with an ACE inhibitor.  He is a former smoker.   At his last visit he was found to have elevated velocities within the left common iliac stent as well as the right external iliac stent and was scheduled for angiography which was performed by Dr. [name] on 10-30-2017.  His stents were felt to be widely patent.  There was concern over aneurysmal disease and therefore the patient was scheduled for CT scan which showed a 3.3cm AAA and a 2.8 cm left common iliac artery aneurysm.  He is back today for further discussions.    PAST MEDICAL HISTORY:   Past Medical History:  Diagnosis Date   AAA (abdominal aortic aneurysm)    Cancer ([place]) 2005   [place]   Cancer of skin, face Dec. 2013  and  Jan.  2014   [facility]  Left cheek and nose;  2016  Left Periorbital   Coronary artery disease    Hyperlipidemia    Hypertension    Leg pain    with walking   MI, old    INFERIOR WALL M.I.   Myocardial infarction ([place]) 10/24/2008   PVD (peripheral vascular disease) ([place])    MODERATE TO SEVERE, INVOLVING THE DISTAL AORTA AND BILATERAL ILIACS     FAMILY HISTORY:   Family History  Problem Relation Age of Onset   Cancer Mother    Hyperlipidemia Mother    Cancer Father     SOCIAL HISTORY:   Social History   Tobacco Use   Smoking status: Former    Types: Cigarettes    Quit date: 10/20/2008     Years since quitting: 13.0   Smokeless tobacco: Never  Substance Use Topics   Alcohol use: Yes    Comment: states drinking 2-3 beers a day     ALLERGIES:   Allergies  Allergen Reactions   Ampicillin Other (See Comments)    Developed prolonged diarrhea - may have developed C. Diff colitis.  Has patient had a PCN reaction causing immediate rash, facial/tongue/throat swelling, SOB or lightheadedness with hypotension: No Has patient had a PCN reaction causing severe rash involving mucus membranes or skin necrosis: No Has patient had a PCN reaction that required hospitalization: No Has patient had a PCN reaction occurring within the last 10 years: No If all of the above answers are "NO", then may proceed with Cephalospori   Iodinated Diagnostic Agents Itching and Rash     CURRENT MEDICATIONS:   Current Outpatient Medications  Medication Sig Dispense Refill   carvedilol (COREG) 12.5 MG tablet Take 1 tablet by mouth twice daily 180 tablet 3   cilostazol (PLETAL) 100 MG tablet Take 1 tablet (100 mg total) by mouth 2 (two) times daily. 180 tablet 0   clopidogrel (PLAVIX) 75 MG tablet Take 1 tablet by mouth once daily 30 tablet 11   diphenhydrAMINE (BENADRYL) 50 MG capsule  Take 50 mg by mouth 1 hour prior to your procedure. 1 capsule 0   fenofibrate (TRICOR) 145 MG tablet Take 1 tablet by mouth once daily 90 tablet 0   Isopropyl Alcohol (SWIMMERS EAR DROPS OT) Place 2 drops into both ears as needed (for swimmer ear).     lisinopril (ZESTRIL) 10 MG tablet Take 1 tablet by mouth once daily 90 tablet 3   loratadine (CLARITIN) 10 MG tablet Take 10 mg by mouth daily.     meclizine (ANTIVERT) 25 MG tablet Take 1 tablet (25 mg total) by mouth 3 (three) times daily as needed for dizziness. 15 tablet 0   Multiple Vitamin (MULTIVITAMIN) tablet Take 1 tablet by mouth daily.     NITROSTAT 0.4 MG SL tablet DISSOLVE ONE TABLET UNDER THE TONGUE EVERY 5 MINUTES AS NEEDED FOR CHEST PAIN.  DO NOT EXCEED A  TOTAL OF 3 DOSES IN 15 MINUTES. NO RELIEF, C 25 tablet 1   predniSONE (DELTASONE) 50 MG tablet One tablet (50mg ) 13 hours prior to procedure; one tablet (50mg ) 7 hours prior to procedure and then one tablet (50 mg) one hour prior to procedure. 3 tablet 0   rosuvastatin (CRESTOR) 20 MG tablet Take 1 tablet by mouth once daily 90 tablet 3   No current facility-administered medications for this visit.    REVIEW OF SYSTEMS:   [X]  denotes positive finding, [ ]  denotes negative finding Cardiac  Comments:  Chest pain or chest pressure:    Shortness of breath upon exertion:    Short of breath when lying flat:    Irregular heart rhythm:        Vascular    Pain in calf, thigh, or hip brought on by ambulation: x   Pain in feet at night that wakes you up from your sleep:     Blood clot in your veins:    Leg swelling:         Pulmonary    Oxygen at home:    Productive cough:     Wheezing:         Neurologic    Sudden weakness in arms or legs:     Sudden numbness in arms or legs:     Sudden onset of difficulty speaking or slurred speech:    Temporary loss of vision in one eye:     Problems with dizziness:         Gastrointestinal    Blood in stool:     Vomited blood:         Genitourinary    Burning when urinating:     Blood in urine:        Psychiatric    Major depression:         Hematologic    Bleeding problems:    Problems with blood clotting too easily:        Skin    Rashes or ulcers:        Constitutional    Fever or chills:      PHYSICAL EXAM:   Vitals:   10/17/21 1028  BP: 137/72  Pulse: 78  Resp: 20  Temp: 98.4 F (36.9 C)  SpO2: 95%  Weight: 184 lb (83.5 kg)  Height: 5\' 7"  (1.702 m)    GENERAL: The patient is a well-nourished male, in no acute distress. The vital signs are documented above. CARDIAC: There is a regular rate and rhythm.  VASCULAR: Nonpalpable pedal pulses PULMONARY: Non-labored respirations ABDOMEN: Soft and  non-tender  MUSCULOSKELETAL: There are no major deformities or cyanosis. NEUROLOGIC: No focal weakness or paresthesias are detected. SKIN: There are no ulcers or rashes noted. PSYCHIATRIC: The patient has a normal affect.  STUDIES:   I have reviewed his CT scan with the following findings: 1. 3.3 cm infrarenal abdominal aortic aneurysm (previously 3.2) without complicating features. Recommend follow-up ultrasound every 3 years. This recommendation follows ACR consensus guidelines: White Paper of the ACR Incidental Findings Committee II on Vascular Findings. J Am Coll Radiol 2013; 10:789-794. 2. Patent kissing stents across the aortic bifurcation into the common iliac arteries. 3. 1.7 cm distal right common iliac artery aneurysm, and 3.3 cm saccular aneurysm of the left common iliac artery (previously 3.1). 4. Patent right external iliac artery stent. 5. 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 6. Descending and sigmoid diverticulosis. MEDICAL ISSUES:   Carotid:  needs repeat in 2024  Left iliac aneurysm: This has increased from 3.1-3.3 cm in 6 months.  After reviewing his CT scan as well as his prior arteriogram, I think that this can be treated by just addressing the left iliac artery.  Potentially, a covered stent in the common iliac artery could exclude this however I did discuss the possibility of embolizing the left hypogastric artery.  He will need to be premedicated for his contrast allergy.  He is contemplating doing this in January or February.  At the time of this procedure I will also evaluate the aorta as well as lower extremity runoff as he does have claudication symptoms.    Leia Alf, MD, FACS [facility] [phone] Pager [phone]

## 2021-10-24 ENCOUNTER — Other Ambulatory Visit: Payer: Self-pay

## 2021-11-04 ENCOUNTER — Other Ambulatory Visit: Payer: Self-pay | Admitting: Cardiovascular Disease

## 2021-11-30 ENCOUNTER — Other Ambulatory Visit: Payer: Self-pay | Admitting: Cardiovascular Disease

## 2021-12-29 ENCOUNTER — Encounter (HOSPITAL_COMMUNITY): Payer: Self-pay | Admitting: *Deleted

## 2021-12-29 ENCOUNTER — Emergency Department (HOSPITAL_COMMUNITY): Payer: Medicare PPO

## 2021-12-29 ENCOUNTER — Emergency Department (HOSPITAL_COMMUNITY)
Admission: EM | Admit: 2021-12-29 | Discharge: 2021-12-29 | Disposition: A | Discharge status: Home / Self Care | Payer: Medicare PPO | Attending: Emergency Medicine | Admitting: Emergency Medicine

## 2021-12-29 ENCOUNTER — Other Ambulatory Visit: Payer: Self-pay

## 2021-12-29 DIAGNOSIS — Z20822 Contact with and (suspected) exposure to covid-19: Secondary | ICD-10-CM | POA: Insufficient documentation

## 2021-12-29 DIAGNOSIS — R739 Hyperglycemia, unspecified: Secondary | ICD-10-CM | POA: Insufficient documentation

## 2021-12-29 DIAGNOSIS — I739 Peripheral vascular disease, unspecified: Secondary | ICD-10-CM | POA: Diagnosis not present

## 2021-12-29 DIAGNOSIS — R471 Dysarthria and anarthria: Secondary | ICD-10-CM | POA: Insufficient documentation

## 2021-12-29 DIAGNOSIS — Z7902 Long term (current) use of antithrombotics/antiplatelets: Secondary | ICD-10-CM | POA: Insufficient documentation

## 2021-12-29 DIAGNOSIS — I672 Cerebral atherosclerosis: Secondary | ICD-10-CM | POA: Diagnosis not present

## 2021-12-29 DIAGNOSIS — R9431 Abnormal electrocardiogram [ECG] [EKG]: Secondary | ICD-10-CM | POA: Diagnosis not present

## 2021-12-29 DIAGNOSIS — R4701 Aphasia: Secondary | ICD-10-CM | POA: Diagnosis not present

## 2021-12-29 DIAGNOSIS — R29818 Other symptoms and signs involving the nervous system: Secondary | ICD-10-CM | POA: Diagnosis not present

## 2021-12-29 DIAGNOSIS — G319 Degenerative disease of nervous system, unspecified: Secondary | ICD-10-CM | POA: Diagnosis not present

## 2021-12-29 LAB — CBC WITH DIFFERENTIAL/PLATELET
Abs Immature Granulocytes: 0.03 10*3/uL (ref 0.00–0.07)
Basophils Absolute: 0 10*3/uL (ref 0.0–0.1)
Basophils Relative: 0 %
Eosinophils Absolute: 0.3 10*3/uL (ref 0.0–0.5)
Eosinophils Relative: 4 %
HCT: 44.4 % (ref 39.0–52.0)
Hemoglobin: 14.4 g/dL (ref 13.0–17.0)
Immature Granulocytes: 0 %
Lymphocytes Relative: 24 %
Lymphs Abs: 1.9 10*3/uL (ref 0.7–4.0)
MCH: 30.6 pg (ref 26.0–34.0)
MCHC: 32.4 g/dL (ref 30.0–36.0)
MCV: 94.5 fL (ref 80.0–100.0)
Monocytes Absolute: 0.5 10*3/uL (ref 0.1–1.0)
Monocytes Relative: 6 %
Neutro Abs: 5.3 10*3/uL (ref 1.7–7.7)
Neutrophils Relative %: 66 %
Platelets: 317 10*3/uL (ref 150–400)
RBC: 4.7 MIL/uL (ref 4.22–5.81)
RDW: 13.3 % (ref 11.5–15.5)
WBC: 8.1 10*3/uL (ref 4.0–10.5)
nRBC: 0 % (ref 0.0–0.2)

## 2021-12-29 LAB — I-STAT CHEM 8, ED
BUN: 14 mg/dL (ref 8–23)
Calcium, Ion: 1.21 mmol/L (ref 1.15–1.40)
Chloride: 103 mmol/L (ref 98–111)
Creatinine, Ser: 0.7 mg/dL (ref 0.61–1.24)
Glucose, Bld: 136 mg/dL — ABNORMAL HIGH (ref 70–99)
HCT: 45 % (ref 39.0–52.0)
Hemoglobin: 15.3 g/dL (ref 13.0–17.0)
Potassium: 3.6 mmol/L (ref 3.5–5.1)
Sodium: 141 mmol/L (ref 135–145)
TCO2: 28 mmol/L (ref 22–32)

## 2021-12-29 LAB — DIFFERENTIAL
Abs Immature Granulocytes: 0.04 10*3/uL (ref 0.00–0.07)
Basophils Absolute: 0.1 10*3/uL (ref 0.0–0.1)
Basophils Relative: 1 %
Eosinophils Absolute: 0.3 10*3/uL (ref 0.0–0.5)
Eosinophils Relative: 3 %
Immature Granulocytes: 1 %
Lymphocytes Relative: 28 %
Lymphs Abs: 2.1 10*3/uL (ref 0.7–4.0)
Monocytes Absolute: 0.5 10*3/uL (ref 0.1–1.0)
Monocytes Relative: 7 %
Neutro Abs: 4.4 10*3/uL (ref 1.7–7.7)
Neutrophils Relative %: 60 %

## 2021-12-29 LAB — CBC
HCT: 43.9 % (ref 39.0–52.0)
Hemoglobin: 14.3 g/dL (ref 13.0–17.0)
MCH: 30.8 pg (ref 26.0–34.0)
MCHC: 32.6 g/dL (ref 30.0–36.0)
MCV: 94.4 fL (ref 80.0–100.0)
Platelets: 290 10*3/uL (ref 150–400)
RBC: 4.65 MIL/uL (ref 4.22–5.81)
RDW: 13.3 % (ref 11.5–15.5)
WBC: 7.3 10*3/uL (ref 4.0–10.5)
nRBC: 0 % (ref 0.0–0.2)

## 2021-12-29 LAB — PROTIME-INR
INR: 0.9 (ref 0.8–1.2)
Prothrombin Time: 12.3 seconds (ref 11.4–15.2)

## 2021-12-29 LAB — BASIC METABOLIC PANEL
Anion gap: 10 (ref 5–15)
BUN: 15 mg/dL (ref 8–23)
CO2: 25 mmol/L (ref 22–32)
Calcium: 9.2 mg/dL (ref 8.9–10.3)
Chloride: 105 mmol/L (ref 98–111)
Creatinine, Ser: 0.81 mg/dL (ref 0.61–1.24)
GFR, Estimated: >60 mL/min (ref >60)
Glucose, Bld: 98 mg/dL (ref 70–99)
Potassium: 4 mmol/L (ref 3.5–5.1)
Sodium: 140 mmol/L (ref 135–145)

## 2021-12-29 LAB — RESP PANEL BY RT-PCR (FLU A&B, COVID) ARPGX2
Influenza A by PCR: NEGATIVE
Influenza B by PCR: NEGATIVE
SARS Coronavirus 2 by RT PCR: NEGATIVE

## 2021-12-29 LAB — APTT: aPTT: 27 seconds (ref 24–36)

## 2021-12-29 MED ORDER — SODIUM CHLORIDE 0.9 % IV SOLN: 100.0000 mL/h | INTRAVENOUS | Status: DC

## 2021-12-29 MED ORDER — SODIUM CHLORIDE 0.9 % IV BOLUS
500.0000 mL | Freq: Once | INTRAVENOUS | Status: AC
  Administered 2021-12-29: 500 mL via INTRAVENOUS

## 2021-12-29 NOTE — ED Notes (Signed)
Got patient into a gown on the monitor patient is resting with call bell in reach and family at bedside ?

## 2021-12-29 NOTE — ED Provider Triage Note (Signed)
Emergency Medicine Provider Triage Evaluation Note  Donald Mckinney , a 76 y.o. male  was evaluated in triage.  Pt complains of change in speech x3 weeks.  Patient states that he noticed a change in speech pattern like anything of it until his family approached him today and told him they were concerned.  States that he does have drooling out of the right side of his mouth but states this has been ongoing for the past year since he had several teeth pulled.  He denies any unilateral weakness or numbness, changes in gait or dropping items.  Review of Systems  Positive: Change in speech Negative: Fever, injury  Physical Exam  BP (!) 167/100 (BP Location: Right Arm)    Pulse (!) 59    Temp 98 F (36.7 C)    Resp 17    SpO2 96%  Gen:   Awake, no distress   Resp:  Normal effort  MSK:   Moves extremities without difficulty  Other:  Appears to have left-sided facial weakness, subtle.  Symmetric arm and leg strength, sensation intact.  Medical Decision Making  Medically screening exam initiated at 2:06 PM.  Appropriate orders placed.  TANK DIFIORE was informed that the remainder of the evaluation will be completed by another provider, this initial triage assessment does not replace that evaluation, and the importance of remaining in the ED until their evaluation is complete.     Tacy Learn, PA-C 12/29/21 [phone]

## 2021-12-29 NOTE — ED Triage Notes (Signed)
Pt reports noticing slurred speech x 3 weeks and difficulty swallowing, will drool from right side of mouth. No facial droop noted at triage. No arm or leg weakness, has dizziness and reports hx of vertigo.

## 2021-12-29 NOTE — ED Notes (Signed)
RN reviewed discharge instructions w/ pt. Follow up reviewed, pt had no further questions 

## 2021-12-29 NOTE — ED Provider Notes (Signed)
[facility] EMERGENCY DEPARTMENT Provider Note   CSN: [id number] Arrival date & time: 12/29/21  1319     History  Chief Complaint  Patient presents with   Aphasia   Weakness    Donald Mckinney is a 76 y.o. male.  HPI Patient presents with his wife who assists with history. Patient has multiple medical issues including peripheral vascular disease and is scheduled for procedure in 2 weeks.  In essence patient this he continues to take Plavix, stopped his aspirin.  He presents today due to concern for new stuttering without extremity weakness, confusion, disorientation or pain.  It seems as though a family member noticed stuttering yesterday, encouraged him to present for evaluation today.    Home Medications Prior to Admission medications   Medication Sig Start Date End Date Taking? Authorizing Provider  carvedilol (COREG) 12.5 MG tablet Take 1 tablet by mouth twice daily 11/30/21   Nahser, Wonda Cheng, MD  cilostazol (PLETAL) 100 MG tablet Take 1 tablet (100 mg total) by mouth 2 (two) times daily. 07/12/21   Ulyses Amor, PA-C  clopidogrel (PLAVIX) 75 MG tablet Take 1 tablet by mouth once daily 10/05/21   Nahser, Wonda Cheng, MD  diphenhydrAMINE (BENADRYL) 50 MG capsule Take 50 mg by mouth 1 hour prior to your procedure. 03/17/21   Serafina Mitchell, MD  fenofibrate (TRICOR) 145 MG tablet Take 1 tablet by mouth once daily 11/04/21   Nahser, Wonda Cheng, MD  Isopropyl Alcohol (SWIMMERS EAR DROPS OT) Place 2 drops into both ears as needed (for swimmer ear).    [provider]  lisinopril (ZESTRIL) 10 MG tablet Take 1 tablet by mouth once daily 09/15/21   Nahser, Wonda Cheng, MD  loratadine (CLARITIN) 10 MG tablet Take 10 mg by mouth daily.    [provider]  meclizine (ANTIVERT) 25 MG tablet Take 1 tablet (25 mg total) by mouth 3 (three) times daily as needed for dizziness. 01/15/17   Francine Graven, DO  Multiple Vitamin (MULTIVITAMIN) tablet Take 1 tablet by  mouth daily.    [provider]  NITROSTAT 0.4 MG SL tablet DISSOLVE ONE TABLET UNDER THE TONGUE EVERY 5 MINUTES AS NEEDED FOR CHEST PAIN.  DO NOT EXCEED A TOTAL OF 3 DOSES IN 15 MINUTES. NO RELIEF, C 10/11/21   Nahser, Wonda Cheng, MD  predniSONE (DELTASONE) 50 MG tablet One tablet (50mg ) 13 hours prior to procedure; one tablet (50mg ) 7 hours prior to procedure and then one tablet (50 mg) one hour prior to procedure. 08/31/21   Waynetta Sandy, MD  rosuvastatin (CRESTOR) 20 MG tablet Take 1 tablet by mouth once daily 11/04/21   Nahser, Wonda Cheng, MD      Allergies    Ampicillin and Iodinated contrast media    Review of Systems   Review of Systems  Constitutional:        Per HPI, otherwise negative  HENT:         Per HPI, otherwise negative  Respiratory:         Per HPI, otherwise negative  Cardiovascular:        Per HPI, otherwise negative  Gastrointestinal:  Negative for vomiting.  Endocrine:       Negative aside from HPI  Genitourinary:        Neg aside from HPI   Musculoskeletal:        Per HPI, otherwise negative  Skin: Negative.   Neurological:  Negative for syncope.   Physical  Exam Updated Vital Signs BP 115/89    Pulse 75    Temp 98 F (36.7 C)    Resp (!) 21    SpO2 94%  Physical Exam Vitals and nursing note reviewed.  Constitutional:      General: He is not in acute distress.    Appearance: He is well-developed.  HENT:     Head: Normocephalic and atraumatic.  Eyes:     Conjunctiva/sclera: Conjunctivae normal.  Cardiovascular:     Rate and Rhythm: Normal rate and regular rhythm.  Pulmonary:     Effort: Pulmonary effort is normal. No respiratory distress.     Breath sounds: No stridor.  Abdominal:     General: There is no distension.  Skin:    General: Skin is warm and dry.  Neurological:     Mental Status: He is alert and oriented to person, place, and time.     Cranial Nerves: Dysarthria present. No facial asymmetry.     Motor: No  weakness, tremor, atrophy or abnormal muscle tone.    ED Results / Procedures / Treatments   Labs (all labs ordered are listed, but only abnormal results are displayed) Labs Reviewed  I-STAT CHEM 8, ED - Abnormal; Notable for the following components:      Result Value   Glucose, Bld 136 (*)    All other components within normal limits  RESP PANEL BY RT-PCR (FLU A&B, COVID) ARPGX2  BASIC METABOLIC PANEL  CBC WITH DIFFERENTIAL/PLATELET  PROTIME-INR  APTT  CBC  DIFFERENTIAL  RAPID URINE DRUG SCREEN, HOSP PERFORMED  URINALYSIS, ROUTINE W REFLEX MICROSCOPIC    EKG EKG Interpretation  Date/Time:  Thursday December 29 2021 18:15:08 EST Ventricular Rate:  69 PR Interval:  198 QRS Duration: 119 QT Interval:  443 QTC Calculation: 475 R Axis:   74 Text Interpretation: Sinus rhythm Nonspecific intraventricular conduction delay Borderline repolarization abnormality Artifact Otherwise within normal limits Confirmed by Carmin Muskrat [phone]) on 12/29/2021 8:00:49 PM  Radiology CT Head Wo Contrast  Result Date: 12/29/2021 CLINICAL DATA:  Neuro deficit, acute, stroke suspected. Speech disturbance over the last 3 weeks. Aphasia. EXAM: CT HEAD WITHOUT CONTRAST TECHNIQUE: Contiguous axial images were obtained from the base of the skull through the vertex without intravenous contrast. RADIATION DOSE REDUCTION: This exam was performed according to the departmental dose-optimization program which includes automated exposure control, adjustment of the mA and/or kV according to patient size and/or use of iterative reconstruction technique. COMPARISON:  MRI 01/15/2017 FINDINGS: Brain: Mild generalized volume loss. No evidence of old or acute focal infarction, mass lesion, hemorrhage, hydrocephalus or extra-axial collection. Vascular: There is atherosclerotic calcification of the major vessels at the base of the brain. Skull: Negative Sinuses/Orbits: Clear/normal Other: None IMPRESSION: No acute, focal or  reversible finding. Mild generalized age related volume loss. Electronically Signed   By: Nelson Chimes M.D.   On: 12/29/2021 14:57   MR BRAIN WO CONTRAST  Result Date: 12/29/2021 CLINICAL DATA:  Initial evaluation for neuro deficit, stroke suspected. EXAM: MRI HEAD WITHOUT CONTRAST TECHNIQUE: Multiplanar, multiecho pulse sequences of the brain and surrounding structures were obtained without intravenous contrast. COMPARISON:  Prior CT from earlier the same day. FINDINGS: Brain: Mildly advanced cerebral volume loss. Few scattered punctate foci of T2/FLAIR hyperintensity noted involving the supratentorial cerebral white matter, nonspecific, but overall mild in nature in felt to be within normal limits for age. No abnormal foci of restricted diffusion to suggest acute or subacute ischemia. Gray-white matter differentiation maintained. No areas  of chronic cortical infarction. No acute or chronic intracranial blood products. No mass lesion, midline shift or mass effect no hydrocephalus or extra-axial fluid collection. Empty sella noted. Midline structures intact. Vascular: Major intracranial vascular flow voids are maintained. Skull and upper cervical spine: Craniocervical junction normal. Bone marrow signal intensity within normal limits. No scalp soft tissue abnormality. Sinuses/Orbits: Globes orbital soft tissues within normal limits. Paranasal sinuses are clear. No mastoid effusion. Other: None. IMPRESSION: 1. No acute intracranial abnormality. 2. Mildly advanced cerebral atrophy for age. 3. Empty sella. Electronically Signed   By: Jeannine Boga M.D.   On: 12/29/2021 19:55    Procedures Procedures    Medications Ordered in ED Medications  sodium chloride 0.9 % bolus 500 mL ( Intravenous Infusion Verify 12/29/21 1938)    Followed by  0.9 %  sodium chloride infusion (has no administration in time range)    ED Course/ Medical Decision Making/ A&P Clinical Course as of 12/29/21 2031  Thu Dec 29, 2021  2009 MR BRAIN WO CONTRAST [MH]    Clinical Course User Index [MH] Lawerance Bach   This patient presents to the ED for concern of stuttering, without facial asymmetry, this involves an extensive number of treatment options, and is a complaint that carries with it a high risk of complications and morbidity.  The differential diagnosis includes nerve palsy, stroke, electrolyte abnormality   Co morbidities that complicate the patient evaluation  Peripheral vascular disease   Social Determinants of Health:  Age   Additional history obtained:  Additional history and/or information obtained from wife External records from outside source obtained and reviewed including as above, time course of illness   After the initial evaluation, orders, including: MRI brain were initiated.  Patient placed on Cardiac and Pulse-Oximetry Monitors. The patient was maintained on a cardiac monitor.  The cardiac monitored showed an rhythm of 70 sinus normal The patient was also maintained on pulse oximetry. The readings were typically 99% room air normal  On repeat evaluation of the patient stayed the same  Lab Tests:  I personally interpreted labs.  The pertinent results include: Unremarkable aside from mild hyperglycemia  Imaging Studies ordered:  I independently visualized and interpreted imaging which showed no acute stroke I agree with the radiologist interpretation    Dispostion / Final MDM:  After consideration of the diagnostic results and the patient's response to treatment, would lengthy conversation about findings, importance of ongoing follow-up for additional considerations including nerve palsy or other etiology for his stuttering, dysarthria.  Without other acute findings, with an otherwise benign neurologic exam, with a duration of about 3 weeks since onset of symptoms patient will continue taking his Plavix, will follow-up outpatient neurology, referral placed.   Hospitalization considered, but given the duration of symptoms, benign MRI, patient discharged.  I feel that the patent would benefit from outpatient neurology follow-up.  Wife and patient both comfortable with plan..   Final Clinical Impression(s) / ED Diagnoses Final diagnoses:  Dysarthria    Rx / DC Orders ED Discharge Orders     None         Carmin Muskrat, MD 12/29/21 2039

## 2021-12-29 NOTE — Discharge Instructions (Addendum)
As discussed, today's evaluation today generally reassuring.  Your MRI did not demonstrate evidence for a stroke or other substantial abnormalities.  However, with your ongoing speech dysfunction is importantly follow-up with our neurology colleagues.  If they do not contact you tomorrow, please call to ensure appropriate follow-up.  Return here for concerning changes in your condition.

## 2021-12-29 NOTE — ED Notes (Signed)
Patient transported to MRI 

## 2021-12-30 NOTE — Addendum Note (Signed)
Addended by: Nicholas Lose on: 12/30/2021 03:33 PM   Modules accepted: Orders

## 2022-01-04 ENCOUNTER — Ambulatory Visit: Payer: Medicare PPO | Admitting: Neurology

## 2022-01-04 ENCOUNTER — Encounter: Payer: Self-pay | Admitting: Neurology

## 2022-01-04 VITALS — BP 176/89 | HR 63 | Ht 67.0 in | Wt 181.0 lb

## 2022-01-04 DIAGNOSIS — R471 Dysarthria and anarthria: Secondary | ICD-10-CM | POA: Diagnosis not present

## 2022-01-04 NOTE — Progress Notes (Signed)
[facility]  PATIENT: Donald Mckinney DOB: [birth year]  REQUESTING CLINICIAN: Carmin Muskrat, MD HISTORY FROM: Patient and spouse  REASON FOR VISIT: Dysarthria    HISTORICAL  CHIEF COMPLAINT:  Chief Complaint  Patient presents with   New Patient (Initial Visit)    RM 37 with wife  here for consult on worsening stuttering and dysarthria. Pt reports, he went to ED for slurring of speech sx have been present for about a month, worse in the morning. Pt reports he also has trouble with swallowing. Pt is scheduled for surgery on 01/17/22 and would like to discuss if it is safe to proceed.     HISTORY OF PRESENT ILLNESS:  This is a 76 year old gentleman with past medical history of vertigo, hypertension, peripheral artery disease and CAD who is presenting with 5 weeks of slurred speech.  Wife noted that the slurred speech is worse in the morning and better in the day will get better. Last week, her daughter-in-law presented to the house and recommended him to go to the ED to rule out stroke.  In the ED she did have a for a head CT and MRI brain which was negative for stroke, no lesion, no structural abnormality no previous bleed and no tumor.  Wife described his speech as not clear, words are slow, he denies any stuttering in the there is no aphasia.  He is scheduled to have a stent placed in the lower extremity on the 28 th of this month.     OTHER MEDICAL CONDITIONS: Vertigo, Hypertension, PAD, CAD   REVIEW OF SYSTEMS: Full 14 system review of systems performed and negative with exception of: as noted in the HPI   ALLERGIES: Allergies  Allergen Reactions   Ampicillin Other (See Comments)    Developed prolonged diarrhea - may have developed C. Diff colitis.  Has patient had a PCN reaction causing immediate rash, facial/tongue/throat swelling, SOB or lightheadedness with hypotension: No Has patient had a PCN reaction causing severe rash involving mucus membranes or  skin necrosis: No Has patient had a PCN reaction that required hospitalization: No Has patient had a PCN reaction occurring within the last 10 years: No If all of the above answers are "NO", then may proceed with Cephalospori   Iodinated Contrast Media Itching and Rash    HOME MEDICATIONS: Outpatient Medications Prior to Visit  Medication Sig Dispense Refill   carvedilol (COREG) 12.5 MG tablet Take 1 tablet by mouth twice daily 180 tablet 2   cilostazol (PLETAL) 100 MG tablet Take 1 tablet (100 mg total) by mouth 2 (two) times daily. 180 tablet 0   clopidogrel (PLAVIX) 75 MG tablet Take 1 tablet by mouth once daily 30 tablet 11   diphenhydrAMINE (BENADRYL) 50 MG capsule Take 50 mg by mouth 1 hour prior to your procedure. 1 capsule 0   fenofibrate (TRICOR) 145 MG tablet Take 1 tablet by mouth once daily 90 tablet 0   Isopropyl Alcohol (SWIMMERS EAR DROPS OT) Place 2 drops into both ears as needed (for swimmer ear).     lisinopril (ZESTRIL) 10 MG tablet Take 1 tablet by mouth once daily 90 tablet 3   loratadine (CLARITIN) 10 MG tablet Take 10 mg by mouth daily.     meclizine (ANTIVERT) 25 MG tablet Take 1 tablet (25 mg total) by mouth 3 (three) times daily as needed for dizziness. 15 tablet 0   Multiple Vitamin (MULTIVITAMIN) tablet Take 1 tablet by mouth daily.  NITROSTAT 0.4 MG SL tablet DISSOLVE ONE TABLET UNDER THE TONGUE EVERY 5 MINUTES AS NEEDED FOR CHEST PAIN.  DO NOT EXCEED A TOTAL OF 3 DOSES IN 15 MINUTES. NO RELIEF, C 25 tablet 1   predniSONE (DELTASONE) 50 MG tablet One tablet (50mg ) 13 hours prior to procedure; one tablet (50mg ) 7 hours prior to procedure and then one tablet (50 mg) one hour prior to procedure. 3 tablet 0   rosuvastatin (CRESTOR) 20 MG tablet Take 1 tablet by mouth once daily 90 tablet 2   No facility-administered medications prior to visit.    PAST MEDICAL HISTORY: Past Medical History:  Diagnosis Date   AAA (abdominal aortic aneurysm)    Cancer ([place])  2005   [place]   Cancer of skin, face Dec. 2013  and  Jan.  2014   [facility]  Left cheek and nose;  2016  Left Periorbital   Coronary artery disease    Hyperlipidemia    Hypertension    Leg pain    with walking   MI, old    INFERIOR WALL M.I.   Myocardial infarction ([place]) 10/24/2008   PVD (peripheral vascular disease) ([place])    MODERATE TO SEVERE, INVOLVING THE DISTAL AORTA AND BILATERAL ILIACS    PAST SURGICAL HISTORY: Past Surgical History:  Procedure Laterality Date   ABDOMINAL AORTOGRAM W/LOWER EXTREMITY N/A 10/30/2017   Procedure: ABDOMINAL AORTOGRAM W/LOWER EXTREMITY;  Surgeon: Waynetta Sandy, MD;  Location: [facility];  Service: Cardiovascular;  Laterality: N/A;   CARDIAC CATHETERIZATION     WELL PRESERVED LEFT VENTRICULAR SYSTOLIC FUNCTION. EF 55%   CORONARY ANGIOPLASTY WITH STENT PLACEMENT     RIGHT CORONARY ARTERY, LEFT ANTERIOR DESCENDING ARTERY   CORONARY ARTERY BYPASS GRAFT     iliac artery stenting Bilateral Jan. 7, 2010   Bilateral CIA and Right External IA  stents    PRE-MALIGNANT / BENIGN SKIN LESION EXCISION Left Oct. 27, 2015   Posterior Neck- Benign mole    FAMILY HISTORY: Family History  Problem Relation Age of Onset   Cancer Mother    Hyperlipidemia Mother    Cancer Father     SOCIAL HISTORY: Social History   Socioeconomic History   Marital status: Married    Spouse name: Not on file   Number of children: Not on file   Years of education: Not on file   Highest education level: Associate degree: academic program  Occupational History   Not on file  Tobacco Use   Smoking status: Former    Types: Cigarettes    Quit date: 10/20/2008    Years since quitting: 13.2   Smokeless tobacco: Never  Vaping Use   Vaping Use: Never used  Substance and Sexual Activity   Alcohol use: Yes    Comment: states drinking 2-3 beers a day   Drug use: No   Sexual activity: Not on file  Other Topics Concern   Not on file  Social History Narrative    Right handed    Caffeine- 1 cup per day   Lives at home with wife    Social Determinants of Health   Financial Resource Strain: Not on file  Food Insecurity: Not on file  Transportation Needs: Not on file  Physical Activity: Not on file  Stress: Not on file  Social Connections: Not on file  Intimate Partner Violence: Not on file    PHYSICAL EXAM  GENERAL EXAM/CONSTITUTIONAL: Vitals:  Vitals:   01/04/22 1417  BP: (!) 176/89  Pulse: 63  Weight: 181 lb (82.1 kg)  Height: 5\' 7"  (1.702 m)   Body mass index is 28.35 kg/m. Wt Readings from Last 3 Encounters:  01/04/22 181 lb (82.1 kg)  10/17/21 184 lb (83.5 kg)  08/03/21 185 lb 3.2 oz (84 kg)   Patient is in no distress; well developed, nourished and groomed; neck is supple  CARDIOVASCULAR: Examination of carotid arteries is normal; no carotid bruits Regular rate and rhythm, no murmurs Examination of peripheral vascular system by observation and palpation is normal  EYES: Pupils round and reactive to light, Visual fields full to confrontation, Extraocular movements intacts,   MUSCULOSKELETAL: Gait, strength, tone, movements noted in Neurologic exam below  NEUROLOGIC: MENTAL STATUS:  No flowsheet data found. awake, alert, oriented to person, place and time recent and remote memory intact normal attention and concentration language fluent, comprehension intact, naming intact, there is mild dysarthria  fund of knowledge appropriate  CRANIAL NERVE:  2nd, 3rd, 4th, 6th - pupils equal and reactive to light, visual fields full to confrontation, extraocular muscles intact, no nystagmus 5th - facial sensation symmetric 7th - facial strength symmetric 8th - hearing intact 9th - palate elevates symmetrically, uvula midline 11th - shoulder shrug symmetric 12th - tongue protrusion midline  MOTOR:  normal bulk and tone, full strength in the BUE, BLE  SENSORY:  normal and symmetric to light touch, pinprick, temperature,  vibration  COORDINATION:  finger-nose-finger, fine finger movements normal  REFLEXES:  deep tendon reflexes present and symmetric  GAIT/STATION:  normal     DIAGNOSTIC DATA (LABS, IMAGING, TESTING) - I reviewed patient records, labs, notes, testing and imaging myself where available.  Lab Results  Component Value Date   WBC 7.3 12/29/2021   HGB 15.3 12/29/2021   HCT 45.0 12/29/2021   MCV 94.4 12/29/2021   PLT 290 12/29/2021      Component Value Date/Time   NA 141 12/29/2021 1833   NA 141 08/03/2021 0844   K 3.6 12/29/2021 1833   CL 103 12/29/2021 1833   CO2 25 12/29/2021 1427   GLUCOSE 136 (H) 12/29/2021 1833   BUN 14 12/29/2021 1833   BUN 10 08/03/2021 0844   CREATININE 0.70 12/29/2021 1833   CREATININE 0.96 10/10/2016 0921   CALCIUM 9.2 12/29/2021 1427   PROT 7.4 08/03/2021 0844   ALBUMIN 4.9 (H) 08/03/2021 0844   AST 21 08/03/2021 0844   ALT 17 08/03/2021 0844   ALKPHOS 41 (L) 08/03/2021 0844   BILITOT 0.4 08/03/2021 0844   GFRNONAA >60 12/29/2021 1427   GFRAA 100 08/02/2020 0938   Lab Results  Component Value Date   CHOL 157 08/03/2021   HDL 56 08/03/2021   LDLCALC 75 08/03/2021   LDLDIRECT 80.7 10/24/2011   TRIG 152 (H) 08/03/2021   CHOLHDL 2.8 08/03/2021   No results found for: HGBA1C No results found for: VITAMINB12 No results found for: TSH   MRI Brain 12/29/2021 1. No acute intracranial abnormality. 2. Mildly advanced cerebral atrophy for age. 3. Empty sella.    ASSESSMENT AND PLAN  76 y.o. year old male with with hypertension, vertigo, peripheral artery disease and coronary artery disease who is presenting with 5 weeks of dysarthria.  On exam he is noted to have mild dysarthria, he is able to name, repeat, and comprehend, there are no aphasia.  He does report some mild choking with eating spicy food.  I will refer him to speech evaluation.  I also advised wife to follow-up with primary care  doctor for a referral to ENT for further work-up  if his symptoms are not improved.  He is scheduled for a stent placement on February 28 and I informed the patient that the dysarthria is not a contraindication to his stent placement.  Follow-up with your primary care doctor and return if worse or any other concern   1. Dysarthria     Patient Instructions  Referral to speech therapy Follow-up with your primary care doctor, if symptoms not improve consider referral to ENT Return if worse or any other concern.  Orders Placed This Encounter  Procedures   Ambulatory referral to Speech Therapy    No orders of the defined types were placed in this encounter.   Return if symptoms worsen or fail to improve.  I have spent a total of 45 minutes at least dedicated to this patient today, preparing to see patient, examining the patient, ordering tests and/or medications, and counseling the patient including preparing to see the patient (review of tests); performing a medically appropriate examination and evaluation; counseling and educating the patient/family/caregiver; referring to other healthcare provider; independent independently interpreting result and communicating results to the family/patient/caregiver; and documenting clinical information in the electronic medical record.   Alric Ran, MD 01/04/2022, 4:46 PM  [facility] [address] [place], [place] [address] [phone]

## 2022-01-04 NOTE — Patient Instructions (Signed)
Referral to speech therapy Follow-up with your primary care doctor, if symptoms not improve consider referral to ENT Return if worse or any other concern.

## 2022-01-17 ENCOUNTER — Other Ambulatory Visit: Payer: Self-pay

## 2022-01-17 ENCOUNTER — Encounter (HOSPITAL_COMMUNITY)
Admission: RE | Disposition: A | Discharge status: Home / Self Care | Payer: Self-pay | Source: Home / Self Care | Attending: Surgery

## 2022-01-17 ENCOUNTER — Ambulatory Visit (HOSPITAL_COMMUNITY)
Admission: RE | Admit: 2022-01-17 | Discharge: 2022-01-17 | Disposition: A | Discharge status: Home / Self Care | Payer: Medicare PPO | Attending: Surgery | Admitting: Surgery

## 2022-01-17 DIAGNOSIS — Z91041 Radiographic dye allergy status: Secondary | ICD-10-CM | POA: Insufficient documentation

## 2022-01-17 DIAGNOSIS — I719 Aortic aneurysm of unspecified site, without rupture: Secondary | ICD-10-CM | POA: Insufficient documentation

## 2022-01-17 DIAGNOSIS — I10 Essential (primary) hypertension: Secondary | ICD-10-CM | POA: Insufficient documentation

## 2022-01-17 DIAGNOSIS — E7800 Pure hypercholesterolemia, unspecified: Secondary | ICD-10-CM | POA: Insufficient documentation

## 2022-01-17 DIAGNOSIS — I2510 Atherosclerotic heart disease of native coronary artery without angina pectoris: Secondary | ICD-10-CM | POA: Insufficient documentation

## 2022-01-17 DIAGNOSIS — I252 Old myocardial infarction: Secondary | ICD-10-CM | POA: Insufficient documentation

## 2022-01-17 DIAGNOSIS — Z79899 Other long term (current) drug therapy: Secondary | ICD-10-CM | POA: Insufficient documentation

## 2022-01-17 DIAGNOSIS — Z87891 Personal history of nicotine dependence: Secondary | ICD-10-CM | POA: Insufficient documentation

## 2022-01-17 DIAGNOSIS — I739 Peripheral vascular disease, unspecified: Secondary | ICD-10-CM | POA: Diagnosis not present

## 2022-01-17 DIAGNOSIS — I723 Aneurysm of iliac artery: Secondary | ICD-10-CM | POA: Diagnosis not present

## 2022-01-17 HISTORY — PX: ABDOMINAL AORTOGRAM: CATH118222

## 2022-01-17 HISTORY — PX: PELVIC ANGIOGRAPHY: CATH118254

## 2022-01-17 HISTORY — PX: EMBOLIZATION: CATH118239

## 2022-01-17 HISTORY — PX: PERIPHERAL VASCULAR INTERVENTION: CATH118257

## 2022-01-17 LAB — POCT ACTIVATED CLOTTING TIME
Activated Clotting Time: 251 seconds
Activated Clotting Time: 209 seconds
Activated Clotting Time: 173 seconds

## 2022-01-17 LAB — POCT I-STAT, CHEM 8
BUN: 11 mg/dL (ref 8–23)
Calcium, Ion: 1.21 mmol/L (ref 1.15–1.40)
Chloride: 102 mmol/L (ref 98–111)
Creatinine, Ser: 0.8 mg/dL (ref 0.61–1.24)
Glucose, Bld: 101 mg/dL — ABNORMAL HIGH (ref 70–99)
HCT: 49 % (ref 39.0–52.0)
Hemoglobin: 16.7 g/dL (ref 13.0–17.0)
Potassium: 4 mmol/L (ref 3.5–5.1)
Sodium: 141 mmol/L (ref 135–145)
TCO2: 29 mmol/L (ref 22–32)

## 2022-01-17 SURGERY — ABDOMINAL AORTOGRAM
Anesthesia: LOCAL

## 2022-01-17 MED ORDER — MIDAZOLAM HCL 2 MG/2ML IJ SOLN
INTRAMUSCULAR | Status: AC
Start: 2022-01-17 — End: ?
  Filled 2022-01-17: qty 2

## 2022-01-17 MED ORDER — OXYCODONE HCL 5 MG PO TABS: 5.0000 mg | ORAL_TABLET | ORAL | Status: DC | PRN

## 2022-01-17 MED ORDER — MIDAZOLAM HCL 2 MG/2ML IJ SOLN
INTRAMUSCULAR | Status: DC | PRN
  Administered 2022-01-17: 1 mg via INTRAVENOUS
  Administered 2022-01-17: 2 mg via INTRAVENOUS
  Administered 2022-01-17: 1 mg via INTRAVENOUS

## 2022-01-17 MED ORDER — MORPHINE SULFATE (PF) 2 MG/ML IV SOLN: 2.0000 mg | INTRAVENOUS | Status: DC | PRN

## 2022-01-17 MED ORDER — FENTANYL CITRATE (PF) 100 MCG/2ML IJ SOLN
INTRAMUSCULAR | Status: AC
  Filled 2022-01-17: qty 2

## 2022-01-17 MED ORDER — SODIUM CHLORIDE 0.9 % IV SOLN: 250.0000 mL | INTRAVENOUS | Status: DC | PRN

## 2022-01-17 MED ORDER — ASPIRIN EC 81 MG PO TBEC
81.0000 mg | DELAYED_RELEASE_TABLET | Freq: Every day | ORAL | Status: DC
  Administered 2022-01-17: 81 mg via ORAL
  Filled 2022-01-17: qty 1

## 2022-01-17 MED ORDER — HEPARIN (PORCINE) IN NACL 1000-0.9 UT/500ML-% IV SOLN
INTRAVENOUS | Status: AC
  Filled 2022-01-17: qty 1000

## 2022-01-17 MED ORDER — ONDANSETRON HCL 4 MG/2ML IJ SOLN: 4.0000 mg | Freq: Four times a day (QID) | INTRAMUSCULAR | Status: DC | PRN

## 2022-01-17 MED ORDER — FENTANYL CITRATE (PF) 100 MCG/2ML IJ SOLN
INTRAMUSCULAR | Status: DC | PRN
  Administered 2022-01-17 (×2): 25 ug via INTRAVENOUS
  Administered 2022-01-17: 50 ug via INTRAVENOUS

## 2022-01-17 MED ORDER — DIPHENHYDRAMINE HCL 50 MG/ML IJ SOLN
INTRAMUSCULAR | Status: AC
  Filled 2022-01-17: qty 1

## 2022-01-17 MED ORDER — SODIUM CHLORIDE 0.9% FLUSH: 3.0000 mL | INTRAVENOUS | Status: DC | PRN

## 2022-01-17 MED ORDER — IODIXANOL 320 MG/ML IV SOLN
INTRAVENOUS | Status: DC | PRN
  Administered 2022-01-17: 125 mL

## 2022-01-17 MED ORDER — HEPARIN SODIUM (PORCINE) 1000 UNIT/ML IJ SOLN
INTRAMUSCULAR | Status: AC
  Filled 2022-01-17: qty 10

## 2022-01-17 MED ORDER — HEPARIN SODIUM (PORCINE) 1000 UNIT/ML IJ SOLN
INTRAMUSCULAR | Status: DC | PRN
  Administered 2022-01-17: 8000 [IU] via INTRAVENOUS

## 2022-01-17 MED ORDER — LABETALOL HCL 5 MG/ML IV SOLN: 10.0000 mg | INTRAVENOUS | Status: DC | PRN

## 2022-01-17 MED ORDER — ACETAMINOPHEN 325 MG PO TABS: 650.0000 mg | ORAL_TABLET | ORAL | Status: DC | PRN

## 2022-01-17 MED ORDER — SODIUM CHLORIDE 0.9 % IV SOLN: INTRAVENOUS | Status: DC

## 2022-01-17 MED ORDER — HYDRALAZINE HCL 20 MG/ML IJ SOLN: 5.0000 mg | INTRAMUSCULAR | Status: DC | PRN

## 2022-01-17 MED ORDER — LIDOCAINE HCL (PF) 1 % IJ SOLN
INTRAMUSCULAR | Status: DC | PRN
  Administered 2022-01-17: 15 mL

## 2022-01-17 MED ORDER — CLOPIDOGREL BISULFATE 75 MG PO TABS
ORAL_TABLET | ORAL | Status: AC
  Filled 2022-01-17: qty 1

## 2022-01-17 MED ORDER — LIDOCAINE HCL (PF) 1 % IJ SOLN
INTRAMUSCULAR | Status: AC
  Filled 2022-01-17: qty 30

## 2022-01-17 MED ORDER — SODIUM CHLORIDE 0.9 % WEIGHT BASED INFUSION
1.0000 mL/kg/h | INTRAVENOUS | Status: DC
  Administered 2022-01-17: 1 mL/kg/h via INTRAVENOUS

## 2022-01-17 MED ORDER — ROSUVASTATIN CALCIUM 10 MG PO TABS
10.0000 mg | ORAL_TABLET | Freq: Every day | ORAL | Status: DC
  Filled 2022-01-17: qty 1

## 2022-01-17 MED ORDER — CLOPIDOGREL BISULFATE 75 MG PO TABS
ORAL_TABLET | ORAL | Status: DC | PRN
  Administered 2022-01-17: 75 mg via ORAL

## 2022-01-17 MED ORDER — DIPHENHYDRAMINE HCL 50 MG/ML IJ SOLN
25.0000 mg | INTRAMUSCULAR | Status: AC
  Administered 2022-01-17: 25 mg via INTRAVENOUS

## 2022-01-17 MED ORDER — METHYLPREDNISOLONE SODIUM SUCC 125 MG IJ SOLR
INTRAMUSCULAR | Status: AC
  Filled 2022-01-17: qty 2

## 2022-01-17 MED ORDER — MIDAZOLAM HCL 2 MG/2ML IJ SOLN
INTRAMUSCULAR | Status: AC
  Filled 2022-01-17: qty 2

## 2022-01-17 MED ORDER — SODIUM CHLORIDE 0.9% FLUSH: 3.0000 mL | Freq: Two times a day (BID) | INTRAVENOUS | Status: DC

## 2022-01-17 MED ORDER — CLOPIDOGREL BISULFATE 75 MG PO TABS: 75.0000 mg | ORAL_TABLET | Freq: Every day | ORAL | Status: DC

## 2022-01-17 MED ORDER — HEPARIN (PORCINE) IN NACL 1000-0.9 UT/500ML-% IV SOLN
INTRAVENOUS | Status: DC | PRN
  Administered 2022-01-17 (×2): 500 mL

## 2022-01-17 MED ORDER — METHYLPREDNISOLONE SODIUM SUCC 125 MG IJ SOLR
125.0000 mg | INTRAMUSCULAR | Status: AC
  Administered 2022-01-17: 125 mg via INTRAVENOUS

## 2022-01-17 SURGICAL SUPPLY — 26 items
BALLN MUSTANG 6.0X40 75 (BALLOONS) ×4
BALLOON MUSTANG 6.0X40 75 (BALLOONS) IMPLANT
CATH ANGIO 5F BER2 65CM (CATHETERS) ×1 IMPLANT
CATH MICRO DIREXION 0.021X130 (CATHETERS) ×1 IMPLANT
CATH OMNI FLUSH 5F 65CM (CATHETERS) ×1 IMPLANT
CATH TEMPO 5F RIM 65CM (CATHETERS) ×1 IMPLANT
COIL IDC 2D .035 6MMX20CM (Embolic) ×2 IMPLANT
COIL IDC 2D .035 8MMX20CM (Embolic) ×1 IMPLANT
COIL IDC 2D HELICAL 10MMX30CM (Embolic) ×1 IMPLANT
COIL IDC 2D HELICAL 12MMX30CM (Embolic) ×2 IMPLANT
DEVICE TORQUE H2O (MISCELLANEOUS) ×1 IMPLANT
GUIDE CATH VISTA IMA 6F (CATHETERS) ×1 IMPLANT
GUIDEWIRE ANGLED .035X150CM (WIRE) ×1 IMPLANT
KIT ENCORE 26 ADVANTAGE (KITS) ×1 IMPLANT
KIT MICROPUNCTURE NIT STIFF (SHEATH) ×1 IMPLANT
KIT PV (KITS) ×4 IMPLANT
SHEATH BRITE TIP 7FR 35CM (SHEATH) ×1 IMPLANT
SHEATH FLEXOR ANSEL 1 7F 45CM (SHEATH) ×1 IMPLANT
SHEATH PINNACLE 5F 10CM (SHEATH) ×1 IMPLANT
STENT VIABAHN 8X39X80 VBX (Permanent Stent) ×1 IMPLANT
STENT VIABAHNBX 8X59X80 (Permanent Stent) ×1 IMPLANT
SYR MEDRAD MARK V 150ML (SYRINGE) ×1 IMPLANT
TRANSDUCER W/STOPCOCK (MISCELLANEOUS) ×4 IMPLANT
TRAY PV CATH (CUSTOM PROCEDURE TRAY) ×4 IMPLANT
TUBING CIL FLEX 10 FLL-RA (TUBING) ×1 IMPLANT
WIRE BENTSON .035X145CM (WIRE) ×1 IMPLANT

## 2022-01-17 NOTE — Op Note (Signed)
Patient name: YAHYA BOLDMAN MRN: [id number] DOB: 07/18/1946 Sex: male  01/17/2022 Pre-operative Diagnosis: Left iliac aneurysm Post-operative diagnosis:  Same Surgeon:  Annamarie Major Procedure Performed:  1.  Ultrasound-guided access, left femoral artery  2.  Abdominal aortogram  3.  Pelvic angiogram  4.  First-order catheterization (left internal iliac artery)  5.  Coil embolization left hypogastric artery  6.  Stent, left common iliac artery  7.  Stent, left external iliac artery  8.  Conscious sedation, 111 minutes   Indications: This is a 76 year old gentleman who was previously gone kissing iliac stents for claudication.  He has developed aneurysmal changes within the left iliac artery.  He has a known infrarenal aneurysm.  He comes in today for repair as the iliac aneurysm has been getting progressively larger  Procedure:  The patient was identified in the holding area and taken to room 8.  The patient was then placed supine on the table and prepped and draped in the usual sterile fashion.  A time out was called.  Conscious sedation was administered with the use of IV fentanyl and Versed under continuous physician and nurse monitoring.  Heart rate, blood pressure, and oxygen saturation were continuously monitored.  Total sedation time was 111 minutes.  Ultrasound was used to evaluate the left common femoral artery.  It was patent .  A digital ultrasound image was acquired.  A micropuncture needle was used to access the left common femoral artery under ultrasound guidance.  An 018 wire was advanced without resistance and a micropuncture sheath was placed.  The 018 wire was removed and a benson wire was placed.  The micropuncture sheath was exchanged for a 5 french sheath.  An omniflush catheter was advanced over the wire to the level of L-1.  An abdominal angiogram was obtained.  Next, the catheter was pulled down to the aortic bifurcation and pelvic angiography was performed. Findings:    Aortogram: No significant renal artery stenosis was identified.  The infrarenal abdominal aorta is widely patent.  There is approximately a 3 cm distal aortic aneurysm.  Stents are visualized within the bilateral common iliac arteries which are patent without stenosis.  The right extrailiac stent is widely patent as is the native left external iliac artery.  There does appear to be aneurysmal changes  Intervention:  The iliac artery on the left.  After the above images were acquired the decision made to proceed with intervention.  A 6 French 45 cm sheath was advanced into the aorta from the left.  The patient was fully heparinized.  Next, I used a IM guide catheter to cannulate what appeared to be initially the left hypogastric artery.  I proceeded to place interlock 018 coils into this area.  I placed a 10 x 30 and 212 x 34 coils.  I then planned to place a covered stent across the aneurysm.  A VBX 8 x 59 was deployed landing just short of the hypogastric artery.  When I took additional images, I realized that I had cold some form of the aneurysm and not the native hypogastric artery.  Therefore I used a RIM catheter to select the internal iliac artery and then I placed a 8 x 20 and a 6 x 20 035 interlock coils.  I then extended the stent into the external iliac artery.  This was a VBX 8 x 39.  Completion imaging showed excellent result with complete coverage of the aneurysm.  Impression:  #1  Successful  coil embolization of the left internal iliac artery and subsequent stenting of the common and external iliac artery using overlapping VBX 8 mm stents for treatment of a iliac artery aneurysm.   Theotis Burrow, M.D., [facility] [place] Office: [phone] Pager:  [phone]

## 2022-01-17 NOTE — H&P (Signed)
[facility] [place]   Patient name: Donald Mckinney        MRN: [id number]        DOB: [birth year]          Sex: male     REASON FOR VISIT:      Follow up   HISOTRY OF PRESENT ILLNESS:    Donald Mckinney is a 76 y.o. male who in 2010 underwent stenting of the right common and external iliac artery as well as the left common iliac artery for rest pain.He is currently taking Pletal daily.   He does not complain of any claudication symptoms.   He suffered an MI in early 2010. This was treated percutaneously.  He takes a statin for hypercholesterolemia.  His blood pressure was managed with an ACE inhibitor.  He is a former smoker.   At his last visit he was found to have elevated velocities within the left common iliac stent as well as the right external iliac stent and was scheduled for angiography which was performed by Dr. [name] on 10-30-2017.  His stents were felt to be widely patent.  There was concern over aneurysmal disease and therefore the patient was scheduled for CT scan which showed a 3.3cm AAA and a 2.8 cm left common iliac artery aneurysm.  He is back today for further discussions.       PAST MEDICAL HISTORY:        Past Medical History:  Diagnosis Date   AAA (abdominal aortic aneurysm)     Cancer ([place]) 2005    [place]   Cancer of skin, face Dec. 2013  and  Jan.  2014    [facility]  Left cheek and nose;  2016  Left Periorbital   Coronary artery disease     Hyperlipidemia     Hypertension     Leg pain      with walking   MI, old      INFERIOR WALL M.I.   Myocardial infarction ([place]) 10/24/2008   PVD (peripheral vascular disease) ([place])      MODERATE TO SEVERE, INVOLVING THE DISTAL AORTA AND BILATERAL ILIACS        FAMILY HISTORY:         Family History  Problem Relation Age of Onset   Cancer Mother     Hyperlipidemia Mother     Cancer Father        SOCIAL HISTORY:    Social History         Tobacco Use   Smoking  status: Former      Types: Cigarettes      Quit date: 10/20/2008      Years since quitting: 13.0   Smokeless tobacco: Never  Substance Use Topics   Alcohol use: Yes      Comment: states drinking 2-3 beers a day        ALLERGIES:         Allergies  Allergen Reactions   Ampicillin Other (See Comments)      Developed prolonged diarrhea - may have developed C. Diff colitis.  Has patient had a PCN reaction causing immediate rash, facial/tongue/throat swelling, SOB or lightheadedness with hypotension: No Has patient had a PCN reaction causing severe rash involving mucus membranes or skin necrosis: No Has patient had a PCN reaction that required hospitalization: No Has patient had a PCN reaction occurring within the last 10 years: No If all of the above answers are "NO", then  may proceed with Cephalospori   Iodinated Diagnostic Agents Itching and Rash        CURRENT MEDICATIONS:          Current Outpatient Medications  Medication Sig Dispense Refill   carvedilol (COREG) 12.5 MG tablet Take 1 tablet by mouth twice daily 180 tablet 3   cilostazol (PLETAL) 100 MG tablet Take 1 tablet (100 mg total) by mouth 2 (two) times daily. 180 tablet 0   clopidogrel (PLAVIX) 75 MG tablet Take 1 tablet by mouth once daily 30 tablet 11   diphenhydrAMINE (BENADRYL) 50 MG capsule Take 50 mg by mouth 1 hour prior to your procedure. 1 capsule 0   fenofibrate (TRICOR) 145 MG tablet Take 1 tablet by mouth once daily 90 tablet 0   Isopropyl Alcohol (SWIMMERS EAR DROPS OT) Place 2 drops into both ears as needed (for swimmer ear).       lisinopril (ZESTRIL) 10 MG tablet Take 1 tablet by mouth once daily 90 tablet 3   loratadine (CLARITIN) 10 MG tablet Take 10 mg by mouth daily.       meclizine (ANTIVERT) 25 MG tablet Take 1 tablet (25 mg total) by mouth 3 (three) times daily as needed for dizziness. 15 tablet 0   Multiple Vitamin (MULTIVITAMIN) tablet Take 1 tablet by mouth daily.       NITROSTAT 0.4 MG SL  tablet DISSOLVE ONE TABLET UNDER THE TONGUE EVERY 5 MINUTES AS NEEDED FOR CHEST PAIN.  DO NOT EXCEED A TOTAL OF 3 DOSES IN 15 MINUTES. NO RELIEF, C 25 tablet 1   predniSONE (DELTASONE) 50 MG tablet One tablet (50mg ) 13 hours prior to procedure; one tablet (50mg ) 7 hours prior to procedure and then one tablet (50 mg) one hour prior to procedure. 3 tablet 0   rosuvastatin (CRESTOR) 20 MG tablet Take 1 tablet by mouth once daily 90 tablet 3    No current facility-administered medications for this visit.      REVIEW OF SYSTEMS:    [X]  denotes positive finding, [ ]  denotes negative finding Cardiac   Comments:  Chest pain or chest pressure:      Shortness of breath upon exertion:      Short of breath when lying flat:      Irregular heart rhythm:             Vascular      Pain in calf, thigh, or hip brought on by ambulation: x    Pain in feet at night that wakes you up from your sleep:       Blood clot in your veins:      Leg swelling:              Pulmonary      Oxygen at home:      Productive cough:       Wheezing:              Neurologic      Sudden weakness in arms or legs:       Sudden numbness in arms or legs:       Sudden onset of difficulty speaking or slurred speech:      Temporary loss of vision in one eye:       Problems with dizziness:              Gastrointestinal      Blood in stool:       Vomited blood:  Genitourinary      Burning when urinating:       Blood in urine:             Psychiatric      Major depression:              Hematologic      Bleeding problems:      Problems with blood clotting too easily:             Skin      Rashes or ulcers:             Constitutional      Fever or chills:          PHYSICAL EXAM:       Vitals:    10/17/21 1028  BP: 137/72  Pulse: 78  Resp: 20  Temp: 98.4 F (36.9 C)  SpO2: 95%  Weight: 184 lb (83.5 kg)  Height: 5\' 7"  (1.702 m)      GENERAL: The patient is a well-nourished male, in no  acute distress. The vital signs are documented above. CARDIAC: There is a regular rate and rhythm.  VASCULAR: Nonpalpable pedal pulses PULMONARY: Non-labored respirations ABDOMEN: Soft and non-tender MUSCULOSKELETAL: There are no major deformities or cyanosis. NEUROLOGIC: No focal weakness or paresthesias are detected. SKIN: There are no ulcers or rashes noted. PSYCHIATRIC: The patient has a normal affect.   STUDIES:    I have reviewed his CT scan with the following findings: 1. 3.3 cm infrarenal abdominal aortic aneurysm (previously 3.2) without complicating features. Recommend follow-up ultrasound every 3 years. This recommendation follows ACR consensus guidelines: White Paper of the ACR Incidental Findings Committee II on Vascular Findings. J Am Coll Radiol 2013; 10:789-794. 2. Patent kissing stents across the aortic bifurcation into the common iliac arteries. 3. 1.7 cm distal right common iliac artery aneurysm, and 3.3 cm saccular aneurysm of the left common iliac artery (previously 3.1). 4. Patent right external iliac artery stent. 5. 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 6. Descending and sigmoid diverticulosis. MEDICAL ISSUES:    Carotid:  needs repeat in 2024   Left iliac aneurysm: This has increased from 3.1-3.3 cm in 6 months.  After reviewing his CT scan as well as his prior arteriogram, I think that this can be treated by just addressing the left iliac artery.  Potentially, a covered stent in the common iliac artery could exclude this however I did discuss the possibility of embolizing the left hypogastric artery.  He will need to be premedicated for his contrast allergy.  He is contemplating doing this in January or February.  At the time of this procedure I will also evaluate the aorta as well as lower extremity runoff as he does have claudication symptoms.       Leia Alf, MD, FACS [facility]  [facility] [phone] Pager [phone]

## 2022-01-17 NOTE — Progress Notes (Addendum)
Site area: left groin arterial sheath Site Prior to Removal:  Level 0 Pressure Applied For: 25 minutes Manual:   yes Patient Status During Pull:  stable Post Pull Site:  Level 0 Post Pull Instructions Given:  yes Post Pull Pulses Present: left dp and pt dopplered Dressing Applied:  gauze and tegaderm Bedrest begins @ 1600 Comments:

## 2022-01-18 ENCOUNTER — Encounter (HOSPITAL_COMMUNITY): Payer: Self-pay | Admitting: Surgery

## 2022-02-06 ENCOUNTER — Other Ambulatory Visit: Payer: Self-pay

## 2022-02-06 ENCOUNTER — Ambulatory Visit (HOSPITAL_COMMUNITY): Payer: Medicare PPO | Attending: Neurology | Admitting: Speech Pathology

## 2022-02-06 ENCOUNTER — Encounter (HOSPITAL_COMMUNITY): Payer: Self-pay | Admitting: Speech Pathology

## 2022-02-06 DIAGNOSIS — R471 Dysarthria and anarthria: Secondary | ICD-10-CM | POA: Insufficient documentation

## 2022-02-06 NOTE — Therapy (Signed)
[facility] ?[address] ?[place], [place], [address] ?Phone: [phone]   Fax:  [phone] ? ?Speech Language Pathology Evaluation ? ?Patient Details  ?Name: Donald Mckinney ?MRN: [id number] ?Date of Birth: 06/15/1946 ?Referring Provider (SLP): Dr. Alric Mckinney ? ? ?Encounter Date: 02/06/2022 ? ? End of Session - 02/06/22 1038   ? ? Visit Number 1   ? Number of Visits 1   ? Authorization Type [employer]   ? SLP Start Time 0900   ? SLP Stop Time  0950   ? SLP Time Calculation (min) 50 min   ? Activity Tolerance Patient tolerated treatment well   ? ?  ?  ? ?  ? ? ?Past Medical History:  ?Diagnosis Date  ? AAA (abdominal aortic aneurysm)   ? Cancer [facility]) 2005  ? BCC  ? Cancer of skin, face Dec. 2013  and  Jan.  2014  ? [place]  Left cheek and nose;  2016  Left Periorbital  ? Coronary artery disease   ? Hyperlipidemia   ? Hypertension   ? Leg pain   ? with walking  ? MI, old   ? INFERIOR WALL M.I.  ? Myocardial infarction ([place]) 10/24/2008  ? PVD (peripheral vascular disease) ([place])   ? MODERATE TO SEVERE, INVOLVING THE DISTAL AORTA AND BILATERAL ILIACS  ? ? ?Past Surgical History:  ?Procedure Laterality Date  ? ABDOMINAL AORTOGRAM N/A 01/17/2022  ? Procedure: ABDOMINAL AORTOGRAM;  Surgeon: Serafina Mitchell, MD;  Location: [place];  Service: Cardiovascular;  Laterality: N/A;  ? ABDOMINAL AORTOGRAM W/LOWER EXTREMITY N/A 10/30/2017  ? Procedure: ABDOMINAL AORTOGRAM W/LOWER EXTREMITY;  Surgeon: Waynetta Sandy, MD;  Location: [facility];  Service: Cardiovascular;  Laterality: N/A;  ? CARDIAC CATHETERIZATION    ? WELL PRESERVED LEFT VENTRICULAR SYSTOLIC FUNCTION. EF 55%  ? CORONARY ANGIOPLASTY WITH STENT PLACEMENT    ? RIGHT CORONARY ARTERY, LEFT ANTERIOR DESCENDING ARTERY  ? CORONARY ARTERY BYPASS GRAFT    ? EMBOLIZATION  01/17/2022  ? Procedure: EMBOLIZATION;  Surgeon: Serafina Mitchell, MD;  Location: [facility];  Service: Cardiovascular;;  ?  iliac artery stenting Bilateral Jan. 7, 2010  ? Bilateral CIA and Right External IA  stents   ? PELVIC ANGIOGRAPHY  01/17/2022  ? Procedure: PELVIC ANGIOGRAPHY;  Surgeon: Serafina Mitchell, MD;  Location: [facility];  Service: Cardiovascular;;  ? PERIPHERAL VASCULAR INTERVENTION Left 01/17/2022  ? Procedure: PERIPHERAL VASCULAR INTERVENTION;  Surgeon: Serafina Mitchell, MD;  Location: [place] [facility];  Service: Cardiovascular;  Laterality: Left;  COMMON AND INT ILIAC  ? PRE-MALIGNANT / BENIGN SKIN LESION EXCISION Left Oct. 27, 2015  ? Posterior Neck- Benign mole  ? ? ?There were no vitals filed for this visit. ? ? Subjective Assessment - 02/06/22 1024   ? ? Subjective "My speech has been slurred for awhile."   ? Special Tests SLUMS ([place] Mental Status Examination)   ? Currently in Pain? No/denies   ? ?  ?  ? ?  ? ? ? ? ? SLP Evaluation [facility] - 02/06/22 1024   ? ?  ? SLP Visit Information  ? SLP Received On 02/06/22   ? Referring Provider (SLP) Dr. Alric Mckinney   ? Onset Date 11/20/2021   ? Medical Diagnosis dysarthria   ?  ? Subjective  ? Subjective "My speech has been slurred for a while."   ? Patient/Family Stated Goal  N/A   ?  ? General Information  ? HPI Donald Mckinney is a 76 yo male who was referred by Dr. Alric Mckinney for evaluation of speech due to mild dysarthria. In the ED, Pt had a head CT and MRI brain which was negative for stroke, no lesion, no structural abnormality no previous bleed and no tumor. MRI showed Mildly advanced cerebral atrophy for age. Empty sella. Pt reports that he had his molars extracted a couple of years ago and wonders if this impacted his speech. He has pooling of saliva at times and his wife thinks he may have sleep apnea.  ? Behavioral/Cognition alert and cooperative   ? Mobility Status ambulatory   ?  ? Balance Screen  ? Has the patient fallen in the past 6 months No   ? Has the patient had a decrease in activity level because of a fear of falling?  No   ?  Is the patient reluctant to leave their home because of a fear of falling?  No   ?  ? Prior Functional Status  ? Cognitive/Linguistic Baseline Within functional limits   ? Type of Home House   ?  Lives With Spouse   ? Available Support Family   ? 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 Vocation Retired   ?  ? Cognition  ? Overall Cognitive Status Within Functional Limits for tasks assessed   ? Memory Appears intact   ? Awareness Appears intact   ? Problem Solving Appears intact   ?  ? Auditory Comprehension  ? Overall Auditory Comprehension Appears within functional limits for tasks assessed   Pt is HOH  ? Yes/No Questions Within Functional Limits   ? Commands Within Functional Limits   ? Conversation Complex   ?  ? Visual Recognition/Discrimination  ? Discrimination Within Function Limits   ?  ? Reading Comprehension  ? Reading Status Within funtional limits   ?  ? Expression  ? Primary Mode of Expression Verbal   ?  ? Verbal Expression  ? Overall Verbal Expression Appears within functional limits for tasks assessed   ? Initiation No impairment   ? Automatic Speech Name;Social Response   ? Level of Generative/Spontaneous Verbalization Conversation   ? Repetition No impairment   ? Naming No impairment   ? Pragmatics No impairment   ? Interfering Components Speech intelligibility   mild dysarthria noted  ? Non-Verbal Means of Communication Not applicable   ?  ? Written Expression  ? Dominant Hand Right   ? Written Expression Not tested   ?  ? Oral Motor/Sensory Function  ? Overall Oral Motor/Sensory Function Appears within functional limits for tasks assessed   ? Labial ROM Within Functional Limits   ? Labial Symmetry Within Functional Limits   ? Labial Strength Within Functional Limits   ? Labial Sensation Within Functional Limits   ? Labial Coordination WFL   ? Lingual ROM Within Functional Limits   ? Lingual Symmetry Within Functional Limits   lingual fasciculations noted at rest  ?  Lingual Strength Reduced   needs further assessment  ? Lingual Sensation Within Functional Limits   ? Lingual Coordination WFL   ? Facial ROM Within Functional Limits   ? Facial Symmetry Within Functional Limits   ? Facial Strength Within Functional Limits   ? Facial Sensation Within Functional Limits   ? Facial Coordination WFL   ? [place] Within Functional Limits   ? Mandible Within  Functional Limits   ?  ? Motor Speech  ? Overall Motor Speech Impaired   ? Respiration Within functional limits   ~13 seconds with sustained phonation /a/ and /z/  ? Phonation Normal   ? Resonance Within functional limits   ? Articulation --   very mild distortions on multisyllabic words  ? Intelligibility Intelligible   ? Motor Planning Witnin functional limits   ? Motor Speech Errors Aware   ? Interfering Components --   Pt had molars extracted one plus years ago  ? Effective Techniques Over-articulate   ? Phonation WFL   ? ?  ?  ? ?  ? ? ?[place] SLUMS Examination ?Orientation  3/3  ?Numeric Problem Solving  3/3  ?Memory  4/5  ?Attention 2/2  ?Thought Organization 3/3  ?Clock Drawing 4/4  ?Visuospatial Skills             ?  2/2  ?Short Story Recall  8/8  ?Total  29/30   ?  ?Scoring  High School Education  Less than High School Education   ?Normal  27-30 25-30  ?Mild Neurocognitive Disorder 21-26 20-24  ?Dementia  1-20 1-19  ? ? ? ? SLP Education - 02/06/22 1036   ? ? Education Details Suggested referral to ENT due to [facility], drainage and also f/u with neurologist due to lingual fasiculations   ? Person(s) Educated Patient   ? Methods Explanation   ? Comprehension Verbalized understanding   ? ?  ?  ? ?  ? ? ? ? ? ? ? Plan - 02/06/22 1040   ? ? Clinical Impression Statement Pt presents with mild dysarthria with mild slowness of speech and imprecise articulation at times with multisyllabic words. Pt able to sustain phonation for ~13 seconds with adequate volume with some "wavering". Diadochokinesis appears WNL. Pt without cognitive  linguistic/language deficits, and scored a 29/30 on the [place] (minus one for 5 item delayed recall). Oral motor examination reveals WNL with the exception of lingual fasciculations and mild lingual weakness with

## 2022-02-07 ENCOUNTER — Telehealth: Payer: Self-pay | Admitting: Neurology

## 2022-02-07 NOTE — Telephone Encounter (Signed)
Pt is to schedule f/u appt with Dr. April Manson in 2 to 3 weeks starting from 3/20. F/u appt is for dysarthria and tongue fasciculation ?LVM for pt to call back and schedule.  ?

## 2022-02-09 ENCOUNTER — Other Ambulatory Visit: Payer: Self-pay | Admitting: Cardiovascular Disease

## 2022-06-22 DIAGNOSIS — Z0189 Encounter for other specified special examinations: Secondary | ICD-10-CM | POA: Diagnosis not present

## 2022-06-22 DIAGNOSIS — R471 Dysarthria and anarthria: Secondary | ICD-10-CM | POA: Diagnosis not present

## 2022-06-22 DIAGNOSIS — I739 Peripheral vascular disease, unspecified: Secondary | ICD-10-CM | POA: Diagnosis not present

## 2022-06-22 DIAGNOSIS — I2510 Atherosclerotic heart disease of native coronary artery without angina pectoris: Secondary | ICD-10-CM | POA: Diagnosis not present

## 2022-06-22 DIAGNOSIS — E785 Hyperlipidemia, unspecified: Secondary | ICD-10-CM | POA: Diagnosis not present

## 2022-06-22 DIAGNOSIS — J329 Chronic sinusitis, unspecified: Secondary | ICD-10-CM | POA: Diagnosis not present

## 2022-06-22 DIAGNOSIS — I10 Essential (primary) hypertension: Secondary | ICD-10-CM | POA: Diagnosis not present

## 2022-06-22 DIAGNOSIS — R42 Dizziness and giddiness: Secondary | ICD-10-CM | POA: Diagnosis not present

## 2022-06-22 DIAGNOSIS — Z139 Encounter for screening, unspecified: Secondary | ICD-10-CM | POA: Diagnosis not present

## 2022-07-05 DIAGNOSIS — Z139 Encounter for screening, unspecified: Secondary | ICD-10-CM | POA: Diagnosis not present

## 2022-07-05 DIAGNOSIS — I10 Essential (primary) hypertension: Secondary | ICD-10-CM | POA: Diagnosis not present

## 2022-07-11 DIAGNOSIS — R42 Dizziness and giddiness: Secondary | ICD-10-CM | POA: Diagnosis not present

## 2022-07-11 DIAGNOSIS — J329 Chronic sinusitis, unspecified: Secondary | ICD-10-CM | POA: Diagnosis not present

## 2022-07-11 DIAGNOSIS — I2510 Atherosclerotic heart disease of native coronary artery without angina pectoris: Secondary | ICD-10-CM | POA: Diagnosis not present

## 2022-07-11 DIAGNOSIS — I10 Essential (primary) hypertension: Secondary | ICD-10-CM | POA: Diagnosis not present

## 2022-07-11 DIAGNOSIS — R471 Dysarthria and anarthria: Secondary | ICD-10-CM | POA: Diagnosis not present

## 2022-07-11 DIAGNOSIS — E785 Hyperlipidemia, unspecified: Secondary | ICD-10-CM | POA: Diagnosis not present

## 2022-07-11 DIAGNOSIS — I739 Peripheral vascular disease, unspecified: Secondary | ICD-10-CM | POA: Diagnosis not present

## 2022-07-11 DIAGNOSIS — G479 Sleep disorder, unspecified: Secondary | ICD-10-CM | POA: Diagnosis not present

## 2022-07-11 DIAGNOSIS — R7303 Prediabetes: Secondary | ICD-10-CM | POA: Diagnosis not present

## 2022-07-27 ENCOUNTER — Other Ambulatory Visit: Payer: Self-pay | Admitting: *Deleted

## 2022-07-27 ENCOUNTER — Other Ambulatory Visit: Payer: Self-pay | Admitting: Cardiovascular Disease

## 2022-07-27 DIAGNOSIS — I723 Aneurysm of iliac artery: Secondary | ICD-10-CM

## 2022-07-30 ENCOUNTER — Encounter: Payer: Self-pay | Admitting: Cardiovascular Disease

## 2022-07-30 NOTE — Progress Notes (Unsigned)
Cardiology Office Note   Date:  08/01/2022   ID:  Donald Mckinney, DOB [birth year], MRN [id number]  PCP:  Celene Squibb, MD  Cardiologist:   [name], MD   Chief Complaint  Patient presents with   Coronary Artery Disease        Hyperlipidemia   1. Coronary artery disease-status post anterior wall myocardial infarction (Dec. 5, 2009), status post PTCA and stenting of his right coronary artery and later status post PTCA and stenting of his LAD 2. Hyperlipidemia 3. Hypertension 4. History of peripheral vascular disease-followed by [name] is a 76 year old gentleman with a history of coronary artery disease. He status post PTCA and stenting of his right coronary artery and also status post PTCA and stenting of left anterior descending artery. He also has a history of hypertension and hyperlipidemia. He has a history of severe peripheral vascular disease and is followed by Dr. Trula Slade.  His blood pressure is typically normal at home. He states that he goes up when he comes to the Dr.  He has been exercising without cp or claudication.  Feb. 17, 2014:  Donald Mckinney is doing very well. He's not had any episodes of chest pain or shortness of breath. He's had some basal cell cancers removed from his face.  He has not been exercising as much as he would like.  He is fasting today.  He has been keeping a record of his BP - his readings are OK.  Nov. 10, 2014:  Donald Mckinney is doing well.  His BP is high today but his BP readings at home are ok.  He is playing lots of golf.    He is off the niaspan.  Apr 03, 2014:  Donald Mckinney is doing well.  He has had some periodontal work.   He has been keeping his BP - is getting some low readings.    Nov. 11, 2015:  Donald Mckinney is doing well.  No CP or dyspnea.  .  Playing golf regularly. Still walks 3 times a week .   He brought his BP log - his readings are quite good.    Apr 05, 2015:   Donald Mckinney is a 76 y.o. male who presents for  Follow up  of his CAD  BP is normal at home. A bit elevated here in the office . [name]    No CP   Nov. 14, 2016:  Doing well.   Has been busy picking corn.   No Cp or dyspnea.    Keeps a record of his BP  - all are in the normal range.   Apr 06, 2016:  Doing well.  No CP or dyspnea  Getting some exercise  Nov. 21, 2017:  Donald Mckinney is seen for follow up of his cad Wants a 90 day supply of meds.  No CP or dyspnea   June 06, 2017:  Donald Mckinney is seen for follow up of his CAD Has developed vertigo -  Went to [facility] ,   Was given Antivert   No angina .     Jan. 17, 2019:   Doing well from cardiac standpoint Is seeing Dr. Trula Slade for AAA and iliac aneurism  Brought his BP cuff to verify  No CP or dyspnea  Eats a low salt diet   Retired from tobacco  farming and computers   June 19, 2018: Donald Mckinney seen back today for follow-up visit. Recent lab work from Dr. Juel Burrow office reveals  a glucose of 104.  Creatinine 0.94.  Liver enzymes are normal.  Electrolytes are normal.  Total cholesterol is 139.  Triglyceride levels 140.  HDL is 52.  LDL is 59   August 02, 2020: Donald Mckinney is seen today for follow-up of his coronary artery disease and peripheral arterial disease.  He has an abdominal aortic aneurysm and iliac aneurysm.  He has been followed by Dr. Trula Slade.. No CP or dyspnea.  Due for lab work today  BP is normal at home  August 03, 2021: Donald Mckinney is seen today for follow-up of his hypertension, coronary artery disease and hyperlipidemia. Had a good garden this past year.   Sept. 11, 2023 Seen wife, Donald Mckinney .  [name] is seen today for follow up of his HTN, CAD, HLD   In Feb. , had some speech issues Stroke was ruled out  Saw neuro  Neuro recommended speech pathology  Spoke to PCP was to get a referral to ENT  First available appt was Oct. 11, 2023    Was to have abd. Aneurism surgery . This is now on hold given his speech and swallowing issues  Is not able to swallow well Has lost  weight      Past Medical History:  Diagnosis Date   AAA (abdominal aortic aneurysm) ([place])    Cancer ([place]) 2005   [place]   Cancer of skin, face Dec. 2013  and  Jan.  2014   [facility]  Left cheek and nose;  2016  Left Periorbital   Coronary artery disease    Hyperlipidemia    Hypertension    Leg pain    with walking   MI, old    INFERIOR WALL M.I.   Myocardial infarction ([place]) 10/24/2008   PVD (peripheral vascular disease) ([place])    MODERATE TO SEVERE, INVOLVING THE DISTAL AORTA AND BILATERAL ILIACS    Past Surgical History:  Procedure Laterality Date   ABDOMINAL AORTOGRAM N/A 01/17/2022   Procedure: ABDOMINAL AORTOGRAM;  Surgeon: Serafina Mitchell, MD;  Location: [facility];  Service: Cardiovascular;  Laterality: N/A;   ABDOMINAL AORTOGRAM W/LOWER EXTREMITY N/A 10/30/2017   Procedure: ABDOMINAL AORTOGRAM W/LOWER EXTREMITY;  Surgeon: Waynetta Sandy, MD;  Location: [name];  Service: Cardiovascular;  Laterality: N/A;   CARDIAC CATHETERIZATION     WELL PRESERVED LEFT VENTRICULAR SYSTOLIC FUNCTION. EF 55%   CORONARY ANGIOPLASTY WITH STENT PLACEMENT     RIGHT CORONARY ARTERY, LEFT ANTERIOR DESCENDING ARTERY   CORONARY ARTERY BYPASS GRAFT     EMBOLIZATION  01/17/2022   Procedure: EMBOLIZATION;  Surgeon: Serafina Mitchell, MD;  Location: [facility];  Service: Cardiovascular;;   iliac artery stenting Bilateral Jan. 7, 2010   Bilateral CIA and Right External IA  stents    PELVIC ANGIOGRAPHY  01/17/2022   Procedure: PELVIC ANGIOGRAPHY;  Surgeon: Serafina Mitchell, MD;  Location: [facility];  Service: Cardiovascular;;   PERIPHERAL VASCULAR INTERVENTION Left 01/17/2022   Procedure: PERIPHERAL VASCULAR INTERVENTION;  Surgeon: Serafina Mitchell, MD;  Location: [facility];  Service: Cardiovascular;  Laterality: Left;  COMMON AND INT ILIAC   PRE-MALIGNANT / BENIGN SKIN LESION EXCISION Left Oct. 27, 2015   Posterior Neck- Benign mole     Current  Outpatient Medications  Medication Sig Dispense Refill   carvedilol (COREG) 6.25 MG tablet Take 1 tablet (6.25 mg total) by mouth 2 (two) times daily with a meal. 180 tablet 3   Chlorphen-Phenyleph-APAP (CORICIDIN D  COLD/FLU/SINUS) 2-5-325 MG TABS Take 1 tablet by mouth daily as needed (Cold).     cilostazol (PLETAL) 100 MG tablet Take 1 tablet (100 mg total) by mouth 2 (two) times daily. 180 tablet 0   clopidogrel (PLAVIX) 75 MG tablet Take 1 tablet by mouth once daily 30 tablet 11   diphenhydrAMINE (BENADRYL ALLERGY) 25 MG tablet Take 50 mg by mouth every 6 (six) hours as needed for allergies.     fenofibrate (TRICOR) 145 MG tablet Take 1 tablet (145 mg total) by mouth daily. Please keep upcoming appointment for additional refills. Thank you. 90 tablet 0   fluticasone (FLONASE) 50 MCG/ACT nasal spray Place 1 spray into both nostrils daily.     Isopropyl Alcohol (SWIMMERS EAR DROPS OT) Place 2 drops into both ears as needed (for swimmer ear).     lisinopril (ZESTRIL) 5 MG tablet Take 1 tablet (5 mg total) by mouth daily. 90 tablet 3   loratadine (CLARITIN) 10 MG tablet Take 10 mg by mouth daily.     meclizine (ANTIVERT) 25 MG tablet Take 1 tablet (25 mg total) by mouth 3 (three) times daily as needed for dizziness. 15 tablet 0   Multiple Vitamin (MULTIVITAMIN) tablet Take 1 tablet by mouth daily. Centrum Mens     NITROSTAT 0.4 MG SL tablet DISSOLVE ONE TABLET UNDER THE TONGUE EVERY 5 MINUTES AS NEEDED FOR CHEST PAIN.  DO NOT EXCEED A TOTAL OF 3 DOSES IN 15 MINUTES. NO RELIEF, C 25 tablet 1   rosuvastatin (CRESTOR) 20 MG tablet Take 1 tablet by mouth once daily 90 tablet 2   No current facility-administered medications for this visit.    Allergies:   Ampicillin and Iodinated contrast media    Social History:  The patient  reports that he quit smoking about 13 years ago. His smoking use included cigarettes. He has never used smokeless tobacco. He reports current alcohol use. He reports that he  does not use drugs.   Family History:  The patient's family history includes Cancer in his father and mother; Hyperlipidemia in his mother.    ROS:  Noted in current hx.  Otherwise negative .    Physical Exam: Blood pressure 115/80, pulse 63, height '5\' 7"'$  (1.702 m), weight 151 lb 12.8 oz (68.9 kg), SpO2 93 %.       GEN:  Well nourished, well developed in no acute distress HEENT: Normal NECK: No JVD; No carotid bruits LYMPHATICS: No lymphadenopathy CARDIAC: RRR , no murmurs, rubs, gallops RESPIRATORY:  Clear to auscultation without rales, wheezing or rhonchi  ABDOMEN: Soft, non-tender, non-distended MUSCULOSKELETAL:  No edema; No deformity  SKIN: Warm and dry NEUROLOGIC:  Alert and oriented x 3,  dysarthria ,    EKG:    Recent Labs: 08/03/2021: ALT 17 12/29/2021: Platelets 290 01/17/2022: BUN 11; Creatinine, Ser 0.80; Hemoglobin 16.7; Potassium 4.0; Sodium 141    Lipid Panel    Component Value Date/Time   CHOL 157 08/03/2021 0844   TRIG 152 (H) 08/03/2021 0844   HDL 56 08/03/2021 0844   CHOLHDL 2.8 08/03/2021 0844   CHOLHDL 2.2 10/10/2016 0921   VLDL 24 10/10/2016 0921   LDLCALC 75 08/03/2021 0844   LDLDIRECT 80.7 10/24/2011 1049      Wt Readings from Last 3 Encounters:  07/31/22 151 lb 12.8 oz (68.9 kg)  01/17/22 183 lb (83 kg)  01/04/22 181 lb (82.1 kg)      Other studies Reviewed: Additional studies/ records that were reviewed today include: .  Review of the above records demonstrates:    ASSESSMENT AND PLAN:  1. Coronary artery disease-    no angina   2. Hyperlipidemia:   . Stable    3. Hypertension-     because of his poor appetite and weight loss, his blood pressure has been dropping low.  He has been very weak and sleepy. We will reduce his carvedilol to 6.25 mg twice a day and also reduce his lisinopril to 5 mg a day.  4. History of peripheral vascular disease-      follow up with Dr. Trula Slade   Current medicines are reviewed at length with  the patient today.  The patient does not have concerns regarding medicines.  The following changes have been made:  no change  Labs/ tests ordered today include:   No orders of the defined types were placed in this encounter.    Disposition:   Follow up in 1 year    [name], MD  08/01/2022 7:53 AM    [address], [place], [place]  [address] Phone: [phone]; Fax: [phone]

## 2022-07-31 ENCOUNTER — Ambulatory Visit: Payer: Medicare PPO | Attending: Cardiovascular Disease | Admitting: Cardiovascular Disease

## 2022-07-31 ENCOUNTER — Encounter: Payer: Self-pay | Admitting: Cardiovascular Disease

## 2022-07-31 ENCOUNTER — Ambulatory Visit (HOSPITAL_COMMUNITY)
Admission: RE | Admit: 2022-07-31 | Discharge: 2022-07-31 | Disposition: A | Discharge status: Home / Self Care | Payer: Medicare PPO | Source: Ambulatory Visit | Attending: Surgery | Admitting: Surgery

## 2022-07-31 VITALS — BP 115/80 | HR 63 | Ht 67.0 in | Wt 151.8 lb

## 2022-07-31 DIAGNOSIS — I723 Aneurysm of iliac artery: Secondary | ICD-10-CM | POA: Diagnosis not present

## 2022-07-31 DIAGNOSIS — I10 Essential (primary) hypertension: Secondary | ICD-10-CM | POA: Diagnosis not present

## 2022-07-31 DIAGNOSIS — I2510 Atherosclerotic heart disease of native coronary artery without angina pectoris: Secondary | ICD-10-CM | POA: Diagnosis not present

## 2022-07-31 DIAGNOSIS — E782 Mixed hyperlipidemia: Secondary | ICD-10-CM | POA: Diagnosis not present

## 2022-07-31 MED ORDER — LISINOPRIL 5 MG PO TABS: 5.0000 mg | ORAL_TABLET | Freq: Every day | ORAL | 3 refills | Status: DC

## 2022-07-31 MED ORDER — CARVEDILOL 6.25 MG PO TABS: 6.2500 mg | ORAL_TABLET | Freq: Two times a day (BID) | ORAL | 3 refills | Status: DC

## 2022-07-31 NOTE — Patient Instructions (Signed)
Medication Instructions:  Your physician has recommended you make the following change in your medication:  Decrease lisinopril to '5mg'$  daily Decrease Coreg to 6.'25mg'$  2 times daily  *If you need a refill on your cardiac medications before your next appointment, please call your pharmacy*    Follow-Up: At [facility], you and your health needs are our priority.  As part of our continuing mission to provide you with exceptional heart care, we have created designated Provider Care Teams.  These Care Teams include your primary Cardiologist (physician) and Advanced Practice Providers (APPs -  Physician Assistants and Nurse Practitioners) who all work together to provide you with the care you need, when you need it.  We recommend signing up for the patient portal called "MyChart".  Sign up information is provided on this After Visit Summary.  MyChart is used to connect with patients for Virtual Visits (Telemedicine).  Patients are able to view lab/test results, encounter notes, upcoming appointments, etc.  Non-urgent messages can be sent to your provider as well.   To learn more about what you can do with MyChart, go to [email or web address].    Your next appointment:   1 year(s)  The format for your next appointment:   In Person  Provider:   [name], MD

## 2022-08-07 ENCOUNTER — Ambulatory Visit: Payer: Medicare PPO | Admitting: Surgery

## 2022-08-07 ENCOUNTER — Inpatient Hospital Stay (HOSPITAL_COMMUNITY): Admission: RE | Admit: 2022-08-07 | Payer: Medicare PPO | Source: Ambulatory Visit

## 2022-08-07 ENCOUNTER — Encounter: Payer: Self-pay | Admitting: Surgery

## 2022-08-07 VITALS — BP 162/90 | HR 60 | Temp 98.0°F | Resp 20 | Ht 67.0 in | Wt 151.0 lb

## 2022-08-07 DIAGNOSIS — I723 Aneurysm of iliac artery: Secondary | ICD-10-CM

## 2022-08-07 NOTE — Progress Notes (Signed)
[facility] [place]  Patient name: Donald Mckinney MRN: [id number] DOB: 01-30-1946 Sex: male   REASON FOR VISIT:    Follow up  HISOTRY OF PRESENT ILLNESS:    Donald Mckinney is a 76 y.o. male who in 2010 underwent stenting of the right common and external iliac artery as well as the left common iliac artery for rest pain.  He was then found to have a 3.3 cm left iliac aneurysm.  On 01/17/2022 he underwent coil embolization of his left internal iliac artery and then stenting of his iliac aneurysm using overlapping VBX 8 mm stents.  He has a known 3.3 cm abdominal aortic aneurysm based off CT scan from October 2022.  He does not complain of any claudication symptoms  He is suffering from severe postnasal drip which is causing trouble swallowing which is led to weight loss.  He is also having issues with slurred speech.  He is scheduled to see ENT later this month.  He has already been seen by neurology.  He suffered an MI in early 2010. This was treated percutaneously.  He takes a statin for hypercholesterolemia.  His blood pressure was managed with an ACE inhibitor.  He is a former smoker. PAST MEDICAL HISTORY:   Past Medical History:  Diagnosis Date   AAA (abdominal aortic aneurysm) ([place])    Cancer ([place]) 2005   [place]   Cancer of skin, face Dec. 2013  and  Jan.  2014   [facility]  Left cheek and nose;  2016  Left Periorbital   Coronary artery disease    Hyperlipidemia    Hypertension    Leg pain    with walking   MI, old    INFERIOR WALL M.I.   Myocardial infarction ([place]) 10/24/2008   PVD (peripheral vascular disease) ([place])    MODERATE TO SEVERE, INVOLVING THE DISTAL AORTA AND BILATERAL ILIACS     FAMILY HISTORY:   Family History  Problem Relation Age of Onset   Cancer Mother    Hyperlipidemia Mother    Cancer Father     SOCIAL HISTORY:   Social History   Tobacco Use   Smoking status: Former    Types: Cigarettes     Quit date: 10/20/2008    Years since quitting: 13.8   Smokeless tobacco: Never  Substance Use Topics   Alcohol use: Yes    Comment: states drinking 2-3 beers a day     ALLERGIES:   Allergies  Allergen Reactions   Ampicillin Other (See Comments)    Developed prolonged diarrhea - may have developed C. Diff colitis.  Has patient had a PCN reaction causing immediate rash, facial/tongue/throat swelling, SOB or lightheadedness with hypotension: No Has patient had a PCN reaction causing severe rash involving mucus membranes or skin necrosis: No Has patient had a PCN reaction that required hospitalization: No Has patient had a PCN reaction occurring within the last 10 years: No If all of the above answers are "NO", then may proceed with Cephalospori   Iodinated Contrast Media Itching and Rash     CURRENT MEDICATIONS:   Current Outpatient Medications  Medication Sig Dispense Refill   carvedilol (COREG) 6.25 MG tablet Take 1 tablet (6.25 mg total) by mouth 2 (two) times daily with a meal. 180 tablet 3   Chlorphen-Phenyleph-APAP (CORICIDIN D COLD/FLU/SINUS) 2-5-325 MG TABS Take 1 tablet by mouth daily as needed (Cold).     cilostazol (PLETAL) 100 MG tablet Take 1 tablet (100  mg total) by mouth 2 (two) times daily. 180 tablet 0   clopidogrel (PLAVIX) 75 MG tablet Take 1 tablet by mouth once daily 30 tablet 11   diphenhydrAMINE (BENADRYL ALLERGY) 25 MG tablet Take 50 mg by mouth every 6 (six) hours as needed for allergies.     fenofibrate (TRICOR) 145 MG tablet Take 1 tablet (145 mg total) by mouth daily. Please keep upcoming appointment for additional refills. Thank you. 90 tablet 0   fluticasone (FLONASE) 50 MCG/ACT nasal spray Place 1 spray into both nostrils daily.     Isopropyl Alcohol (SWIMMERS EAR DROPS OT) Place 2 drops into both ears as needed (for swimmer ear).     lisinopril (ZESTRIL) 5 MG tablet Take 1 tablet (5 mg total) by mouth daily. 90 tablet 3   loratadine (CLARITIN) 10 MG  tablet Take 10 mg by mouth daily.     meclizine (ANTIVERT) 25 MG tablet Take 1 tablet (25 mg total) by mouth 3 (three) times daily as needed for dizziness. 15 tablet 0   Multiple Vitamin (MULTIVITAMIN) tablet Take 1 tablet by mouth daily. Centrum Mens     NITROSTAT 0.4 MG SL tablet DISSOLVE ONE TABLET UNDER THE TONGUE EVERY 5 MINUTES AS NEEDED FOR CHEST PAIN.  DO NOT EXCEED A TOTAL OF 3 DOSES IN 15 MINUTES. NO RELIEF, C 25 tablet 1   rosuvastatin (CRESTOR) 20 MG tablet Take 1 tablet by mouth once daily 90 tablet 2   No current facility-administered medications for this visit.    REVIEW OF SYSTEMS:   '[X]'$  denotes positive finding, '[ ]'$  denotes negative finding Cardiac  Comments:  Chest pain or chest pressure:    Shortness of breath upon exertion:    Short of breath when lying flat:    Irregular heart rhythm:        Vascular    Pain in calf, thigh, or hip brought on by ambulation:    Pain in feet at night that wakes you up from your sleep:     Blood clot in your veins:    Leg swelling:         Pulmonary    Oxygen at home:    Productive cough:     Wheezing:         Neurologic    Sudden weakness in arms or legs:     Sudden numbness in arms or legs:     Sudden onset of difficulty speaking or slurred speech: x   Temporary loss of vision in one eye:     Problems with dizziness:         Gastrointestinal    Blood in stool:     Vomited blood:         Genitourinary    Burning when urinating:     Blood in urine:        Psychiatric    Major depression:         Hematologic    Bleeding problems:    Problems with blood clotting too easily:        Skin    Rashes or ulcers:        Constitutional    Fever or chills:      PHYSICAL EXAM:   Vitals:   08/07/22 1003  BP: (!) 162/90  Pulse: 60  Resp: 20  Temp: 98 F (36.7 C)  SpO2: 93%  Weight: 151 lb (68.5 kg)  Height: '5\' 7"'$  (1.702 m)    GENERAL: The patient is a  well-nourished male, in no acute distress. The vital signs  are documented above. CARDIAC: There is a regular rate and rhythm.  VASCULAR: Palpable femoral pulses PULMONARY: Non-labored respirations ABDOMEN: Soft and non-tende MUSCULOSKELETAL: There are no major deformities or cyanosis. NEUROLOGIC: No focal weakness or paresthesias are detected. SKIN: There are no ulcers or rashes noted. PSYCHIATRIC: The patient has a normal affect.  STUDIES:   I have reviewed his ultrasound with the following findings: Patent bilateral CIA stents with no stenosis seen.  AAA 2.97 x 3.33 .cm   MEDICAL ISSUES:   Left iliac aneurysm: This has been excluded.  Diameter measurements are roughly the same.  He has monophasic waveforms on ultrasound however the this has been seen before and there is no obvious stenosis on his recent angiogram.  In addition he is not having symptoms.  He has a known abdominal aortic aneurysm.  Maximum diameter was 3.3 cm.  This will need to be followed up on.  Have him scheduled to follow-up in 6 months with ABIs and aortoiliac duplex    Annamarie Major, IV, MD, FACS [facility] [phone] Pager [phone]

## 2022-08-10 ENCOUNTER — Other Ambulatory Visit: Payer: Self-pay

## 2022-08-10 DIAGNOSIS — I739 Peripheral vascular disease, unspecified: Secondary | ICD-10-CM

## 2022-08-10 DIAGNOSIS — I7143 Infrarenal abdominal aortic aneurysm, without rupture: Secondary | ICD-10-CM

## 2022-08-13 ENCOUNTER — Encounter (HOSPITAL_COMMUNITY): Payer: Self-pay

## 2022-08-13 ENCOUNTER — Emergency Department (HOSPITAL_COMMUNITY): Payer: Medicare PPO

## 2022-08-13 ENCOUNTER — Inpatient Hospital Stay (HOSPITAL_COMMUNITY)
Admission: EM | Admit: 2022-08-13 | Discharge: 2022-08-16 | DRG: 177 | Disposition: A | Discharge status: Home / Self Care | Payer: Medicare PPO | Attending: Internal Medicine | Admitting: Internal Medicine

## 2022-08-13 ENCOUNTER — Other Ambulatory Visit: Payer: Self-pay

## 2022-08-13 DIAGNOSIS — R7401 Elevation of levels of liver transaminase levels: Secondary | ICD-10-CM

## 2022-08-13 DIAGNOSIS — Z20822 Contact with and (suspected) exposure to covid-19: Secondary | ICD-10-CM | POA: Diagnosis present

## 2022-08-13 DIAGNOSIS — Z79899 Other long term (current) drug therapy: Secondary | ICD-10-CM

## 2022-08-13 DIAGNOSIS — R471 Dysarthria and anarthria: Secondary | ICD-10-CM

## 2022-08-13 DIAGNOSIS — I739 Peripheral vascular disease, unspecified: Secondary | ICD-10-CM | POA: Diagnosis present

## 2022-08-13 DIAGNOSIS — I2510 Atherosclerotic heart disease of native coronary artery without angina pectoris: Secondary | ICD-10-CM | POA: Diagnosis present

## 2022-08-13 DIAGNOSIS — J449 Chronic obstructive pulmonary disease, unspecified: Secondary | ICD-10-CM | POA: Diagnosis present

## 2022-08-13 DIAGNOSIS — Z800 Family history of malignant neoplasm of digestive organs: Secondary | ICD-10-CM

## 2022-08-13 DIAGNOSIS — D72829 Elevated white blood cell count, unspecified: Secondary | ICD-10-CM | POA: Diagnosis present

## 2022-08-13 DIAGNOSIS — J690 Pneumonitis due to inhalation of food and vomit: Principal | ICD-10-CM

## 2022-08-13 DIAGNOSIS — I10 Essential (primary) hypertension: Secondary | ICD-10-CM | POA: Diagnosis present

## 2022-08-13 DIAGNOSIS — E785 Hyperlipidemia, unspecified: Secondary | ICD-10-CM | POA: Diagnosis present

## 2022-08-13 DIAGNOSIS — R0602 Shortness of breath: Secondary | ICD-10-CM | POA: Diagnosis present

## 2022-08-13 DIAGNOSIS — R1310 Dysphagia, unspecified: Secondary | ICD-10-CM | POA: Diagnosis not present

## 2022-08-13 DIAGNOSIS — R109 Unspecified abdominal pain: Secondary | ICD-10-CM | POA: Diagnosis not present

## 2022-08-13 DIAGNOSIS — I959 Hypotension, unspecified: Secondary | ICD-10-CM | POA: Diagnosis present

## 2022-08-13 DIAGNOSIS — I252 Old myocardial infarction: Secondary | ICD-10-CM

## 2022-08-13 DIAGNOSIS — M5412 Radiculopathy, cervical region: Secondary | ICD-10-CM | POA: Diagnosis not present

## 2022-08-13 DIAGNOSIS — I7143 Infrarenal abdominal aortic aneurysm, without rupture: Secondary | ICD-10-CM | POA: Diagnosis present

## 2022-08-13 DIAGNOSIS — R1312 Dysphagia, oropharyngeal phase: Secondary | ICD-10-CM | POA: Diagnosis not present

## 2022-08-13 DIAGNOSIS — R1033 Periumbilical pain: Principal | ICD-10-CM

## 2022-08-13 DIAGNOSIS — Z7902 Long term (current) use of antithrombotics/antiplatelets: Secondary | ICD-10-CM | POA: Diagnosis not present

## 2022-08-13 DIAGNOSIS — R634 Abnormal weight loss: Secondary | ICD-10-CM | POA: Diagnosis not present

## 2022-08-13 DIAGNOSIS — R945 Abnormal results of liver function studies: Secondary | ICD-10-CM | POA: Diagnosis not present

## 2022-08-13 DIAGNOSIS — Z87891 Personal history of nicotine dependence: Secondary | ICD-10-CM | POA: Diagnosis not present

## 2022-08-13 DIAGNOSIS — I6523 Occlusion and stenosis of bilateral carotid arteries: Secondary | ICD-10-CM | POA: Diagnosis not present

## 2022-08-13 DIAGNOSIS — I700 Atherosclerosis of aorta: Secondary | ICD-10-CM | POA: Diagnosis not present

## 2022-08-13 DIAGNOSIS — Z955 Presence of coronary angioplasty implant and graft: Secondary | ICD-10-CM

## 2022-08-13 DIAGNOSIS — I712 Thoracic aortic aneurysm, without rupture, unspecified: Secondary | ICD-10-CM | POA: Diagnosis not present

## 2022-08-13 DIAGNOSIS — I723 Aneurysm of iliac artery: Secondary | ICD-10-CM | POA: Diagnosis not present

## 2022-08-13 DIAGNOSIS — R17 Unspecified jaundice: Secondary | ICD-10-CM | POA: Diagnosis present

## 2022-08-13 DIAGNOSIS — Z85828 Personal history of other malignant neoplasm of skin: Secondary | ICD-10-CM | POA: Diagnosis not present

## 2022-08-13 DIAGNOSIS — R1319 Other dysphagia: Secondary | ICD-10-CM | POA: Diagnosis not present

## 2022-08-13 DIAGNOSIS — R9431 Abnormal electrocardiogram [ECG] [EKG]: Secondary | ICD-10-CM | POA: Diagnosis not present

## 2022-08-13 DIAGNOSIS — I771 Stricture of artery: Secondary | ICD-10-CM | POA: Diagnosis not present

## 2022-08-13 DIAGNOSIS — J9601 Acute respiratory failure with hypoxia: Secondary | ICD-10-CM

## 2022-08-13 DIAGNOSIS — N400 Enlarged prostate without lower urinary tract symptoms: Secondary | ICD-10-CM | POA: Diagnosis not present

## 2022-08-13 LAB — CBC
HCT: 41 % (ref 39.0–52.0)
HCT: 40 % (ref 39.0–52.0)
Hemoglobin: 13.5 g/dL (ref 13.0–17.0)
Hemoglobin: 13 g/dL (ref 13.0–17.0)
MCH: 30.5 pg (ref 26.0–34.0)
MCH: 30.1 pg (ref 26.0–34.0)
MCHC: 32.9 g/dL (ref 30.0–36.0)
MCHC: 32.5 g/dL (ref 30.0–36.0)
MCV: 92.8 fL (ref 80.0–100.0)
MCV: 92.6 fL (ref 80.0–100.0)
Platelets: 224 10*3/uL (ref 150–400)
Platelets: 233 10*3/uL (ref 150–400)
RBC: 4.42 MIL/uL (ref 4.22–5.81)
RBC: 4.32 MIL/uL (ref 4.22–5.81)
RDW: 13.5 % (ref 11.5–15.5)
RDW: 14 % (ref 11.5–15.5)
WBC: 14.6 10*3/uL — ABNORMAL HIGH (ref 4.0–10.5)
WBC: 21.3 10*3/uL — ABNORMAL HIGH (ref 4.0–10.5)
nRBC: 0 % (ref 0.0–0.2)
nRBC: 0 % (ref 0.0–0.2)

## 2022-08-13 LAB — TROPONIN I (HIGH SENSITIVITY)
Troponin I (High Sensitivity): 6 ng/L (ref <18)
Troponin I (High Sensitivity): 7 ng/L (ref <18)

## 2022-08-13 LAB — URINALYSIS, ROUTINE W REFLEX MICROSCOPIC
Bilirubin Urine: NEGATIVE
Glucose, UA: NEGATIVE mg/dL
Hgb urine dipstick: NEGATIVE
Ketones, ur: NEGATIVE mg/dL
Leukocytes,Ua: NEGATIVE
Nitrite: NEGATIVE
Protein, ur: NEGATIVE mg/dL
Specific Gravity, Urine: 1.011 (ref 1.005–1.030)
pH: 6 (ref 5.0–8.0)

## 2022-08-13 LAB — COMPREHENSIVE METABOLIC PANEL
ALT: 220 U/L — ABNORMAL HIGH (ref 0–44)
ALT: 205 U/L — ABNORMAL HIGH (ref 0–44)
AST: 340 U/L — ABNORMAL HIGH (ref 15–41)
AST: 224 U/L — ABNORMAL HIGH (ref 15–41)
Albumin: 4.2 g/dL (ref 3.5–5.0)
Albumin: 3.7 g/dL (ref 3.5–5.0)
Alkaline Phosphatase: 57 U/L (ref 38–126)
Alkaline Phosphatase: 50 U/L (ref 38–126)
Anion gap: 11 (ref 5–15)
Anion gap: 11 (ref 5–15)
BUN: 10 mg/dL (ref 8–23)
BUN: 9 mg/dL (ref 8–23)
CO2: 31 mmol/L (ref 22–32)
CO2: 25 mmol/L (ref 22–32)
Calcium: 9.5 mg/dL (ref 8.9–10.3)
Calcium: 8.9 mg/dL (ref 8.9–10.3)
Chloride: 94 mmol/L — ABNORMAL LOW (ref 98–111)
Chloride: 101 mmol/L (ref 98–111)
Creatinine, Ser: 0.79 mg/dL (ref 0.61–1.24)
Creatinine, Ser: 0.88 mg/dL (ref 0.61–1.24)
GFR, Estimated: >60 mL/min (ref >60)
GFR, Estimated: >60 mL/min (ref >60)
Glucose, Bld: 109 mg/dL — ABNORMAL HIGH (ref 70–99)
Glucose, Bld: 181 mg/dL — ABNORMAL HIGH (ref 70–99)
Potassium: 3.6 mmol/L (ref 3.5–5.1)
Potassium: 3.7 mmol/L (ref 3.5–5.1)
Sodium: 136 mmol/L (ref 135–145)
Sodium: 137 mmol/L (ref 135–145)
Total Bilirubin: 2.7 mg/dL — ABNORMAL HIGH (ref 0.3–1.2)
Total Bilirubin: 1.1 mg/dL (ref 0.3–1.2)
Total Protein: 6.8 g/dL (ref 6.5–8.1)
Total Protein: 6.7 g/dL (ref 6.5–8.1)

## 2022-08-13 LAB — RESPIRATORY PANEL BY PCR

## 2022-08-13 LAB — BRAIN NATRIURETIC PEPTIDE: B Natriuretic Peptide: 64.5 pg/mL (ref 0.0–100.0)

## 2022-08-13 LAB — SARS CORONAVIRUS 2 BY RT PCR: SARS Coronavirus 2 by RT PCR: NEGATIVE

## 2022-08-13 LAB — HEPATITIS PANEL, ACUTE
HCV Ab: NONREACTIVE
Hep A IgM: NONREACTIVE
Hep B C IgM: NONREACTIVE
Hepatitis B Surface Ag: NONREACTIVE

## 2022-08-13 LAB — LIPASE, BLOOD: Lipase: 36 U/L (ref 11–51)

## 2022-08-13 LAB — ACETAMINOPHEN LEVEL: Acetaminophen (Tylenol), Serum: <10 ug/mL — ABNORMAL LOW (ref 10–30)

## 2022-08-13 MED ORDER — SODIUM CHLORIDE 0.9 % IV BOLUS
1000.0000 mL | Freq: Once | INTRAVENOUS | Status: AC
  Administered 2022-08-13: 1000 mL via INTRAVENOUS

## 2022-08-13 MED ORDER — SODIUM CHLORIDE 0.9 % IV SOLN: 1.0000 g | Freq: Once | INTRAVENOUS | Status: DC

## 2022-08-13 MED ORDER — CLOPIDOGREL BISULFATE 75 MG PO TABS
75.0000 mg | ORAL_TABLET | Freq: Every day | ORAL | Status: DC
  Administered 2022-08-14 – 2022-08-16 (×3): 75 mg via ORAL
  Filled 2022-08-13 (×3): qty 1

## 2022-08-13 MED ORDER — CARVEDILOL 6.25 MG PO TABS: 6.2500 mg | ORAL_TABLET | Freq: Two times a day (BID) | ORAL | Status: DC

## 2022-08-13 MED ORDER — ACETAMINOPHEN 325 MG PO TABS: 650.0000 mg | ORAL_TABLET | Freq: Four times a day (QID) | ORAL | Status: DC | PRN

## 2022-08-13 MED ORDER — METRONIDAZOLE 500 MG/100ML IV SOLN
500.0000 mg | Freq: Two times a day (BID) | INTRAVENOUS | Status: DC
  Administered 2022-08-13 – 2022-08-14 (×2): 500 mg via INTRAVENOUS
  Filled 2022-08-13 (×2): qty 100

## 2022-08-13 MED ORDER — DIPHENHYDRAMINE HCL 50 MG/ML IJ SOLN
50.0000 mg | Freq: Once | INTRAMUSCULAR | Status: AC
  Administered 2022-08-13: 50 mg via INTRAVENOUS
  Filled 2022-08-13: qty 1

## 2022-08-13 MED ORDER — METHYLPREDNISOLONE SODIUM SUCC 40 MG IJ SOLR
40.0000 mg | Freq: Once | INTRAMUSCULAR | Status: AC
  Administered 2022-08-13: 40 mg via INTRAVENOUS
  Filled 2022-08-13: qty 1

## 2022-08-13 MED ORDER — SODIUM CHLORIDE 0.9 % IV SOLN
2.0000 g | INTRAVENOUS | Status: DC
  Administered 2022-08-13: 2 g via INTRAVENOUS
  Filled 2022-08-13: qty 20

## 2022-08-13 MED ORDER — AZITHROMYCIN 250 MG PO TABS
500.0000 mg | ORAL_TABLET | Freq: Once | ORAL | Status: AC
  Administered 2022-08-13: 500 mg via ORAL
  Filled 2022-08-13: qty 2

## 2022-08-13 MED ORDER — ACETAMINOPHEN 650 MG RE SUPP: 650.0000 mg | Freq: Four times a day (QID) | RECTAL | Status: DC | PRN

## 2022-08-13 MED ORDER — IPRATROPIUM-ALBUTEROL 0.5-2.5 (3) MG/3ML IN SOLN: 3.0000 mL | RESPIRATORY_TRACT | Status: DC | PRN

## 2022-08-13 MED ORDER — ENOXAPARIN SODIUM 40 MG/0.4ML IJ SOSY
40.0000 mg | PREFILLED_SYRINGE | INTRAMUSCULAR | Status: DC
  Administered 2022-08-13: 40 mg via SUBCUTANEOUS
  Filled 2022-08-13 (×2): qty 0.4

## 2022-08-13 MED ORDER — LISINOPRIL 10 MG PO TABS: 5.0000 mg | ORAL_TABLET | Freq: Every day | ORAL | Status: DC

## 2022-08-13 MED ORDER — POLYETHYLENE GLYCOL 3350 17 G PO PACK: 17.0000 g | PACK | Freq: Every day | ORAL | Status: DC | PRN

## 2022-08-13 MED ORDER — DIPHENHYDRAMINE HCL 25 MG PO CAPS: 50.0000 mg | ORAL_CAPSULE | Freq: Once | ORAL | Status: AC

## 2022-08-13 MED ORDER — IOHEXOL 350 MG/ML SOLN
85.0000 mL | Freq: Once | INTRAVENOUS | Status: AC | PRN
  Administered 2022-08-13: 85 mL via INTRAVENOUS

## 2022-08-13 MED ORDER — UMECLIDINIUM-VILANTEROL 62.5-25 MCG/ACT IN AEPB
1.0000 | INHALATION_SPRAY | Freq: Every day | RESPIRATORY_TRACT | Status: DC
  Administered 2022-08-14 – 2022-08-16 (×3): 1 via RESPIRATORY_TRACT
  Filled 2022-08-13: qty 14

## 2022-08-13 NOTE — ED Triage Notes (Signed)
Patient complains of abdominal intermittently for some time. Also reports ongoing hearing loss which patient reports is causing his speech to be slurred. Patient denies nausea and vomiting but complains of ongoing constipation

## 2022-08-13 NOTE — ED Provider Notes (Signed)
[facility] EMERGENCY DEPARTMENT Provider Note   CSN: [id number] Arrival date & time: 08/13/22  0747     History  Chief Complaint  Patient presents with   Abdominal Pain   Shortness of Breath    Donald Mckinney is a 76 y.o. male.  Patient presents to the hospital complaining of abdominal pain and shortness of breath.  Patient reports sharp abdominal pain at approximately 2 AM which self resolved.  Patient reports that pain was severe at that time.  Patient's wife also reports that patient was short of breath at approximately the same time, with his pulse oximeter reading of 82%.  Patient does not use oxygen at home.  The patient has a reported history of 50 pound weight loss since July of this year.  Patient also has difficulty swallowing which he believes is due to ear pressure.  He states that it is painful to eat because of his ear pressure but also endorses the difficulty with swallowing.  Patient seems hesitant to discuss his current medical conditions but does endorse shortness of breath upon questioning.  Upon arrival at the emergency department he was at 90% oxygen saturation on room air.  Patient also tachypneic.  Patient currently on 2 L/min upon arrival to his room, with oxygen saturations at 95%.  Patient's daughter-in-law, a Designer, jewellery, expresses concerns about patient's recent condition.  She states that the patient has not been eating due to dysphagia.  She states that the abdominal pain has been very frequent in nature and that the patient appears to possibly be in denial about his medical conditions.  She states that outside providers have referred him to otolaryngology and audiology for hearing loss and swallowing issues but is concerned that he may have some sort of other underlying condition causing the weight loss.  Past medical history known to be significant for coronary angioplasty with stent, skin cancer, iliac artery stenting, peripheral vascular  disease, hypertension, infrarenal abdominal aortic aneurysm, iliac artery aneurysm  HPI     Home Medications Prior to Admission medications   Medication Sig Start Date End Date Taking? Authorizing Provider  carvedilol (COREG) 6.25 MG tablet Take 1 tablet (6.25 mg total) by mouth 2 (two) times daily with a meal. 07/31/22   Nahser, Wonda Cheng, MD  Chlorphen-Phenyleph-APAP (CORICIDIN D COLD/FLU/SINUS) 2-5-325 MG TABS Take 1 tablet by mouth daily as needed (Cold).    [provider]  cilostazol (PLETAL) 100 MG tablet Take 1 tablet (100 mg total) by mouth 2 (two) times daily. 07/12/21   Ulyses Amor, PA-C  clopidogrel (PLAVIX) 75 MG tablet Take 1 tablet by mouth once daily 10/05/21   Nahser, Wonda Cheng, MD  diphenhydrAMINE (BENADRYL ALLERGY) 25 MG tablet Take 50 mg by mouth every 6 (six) hours as needed for allergies.    [provider]  fenofibrate (TRICOR) 145 MG tablet Take 1 tablet (145 mg total) by mouth daily. Please keep upcoming appointment for additional refills. Thank you. 07/27/22   Nahser, Wonda Cheng, MD  fluticasone (FLONASE) 50 MCG/ACT nasal spray Place 1 spray into both nostrils daily. 07/11/22   [provider]  Isopropyl Alcohol (SWIMMERS EAR DROPS OT) Place 2 drops into both ears as needed (for swimmer ear).    [provider]  lisinopril (ZESTRIL) 5 MG tablet Take 1 tablet (5 mg total) by mouth daily. 07/31/22   Nahser, Wonda Cheng, MD  loratadine (CLARITIN) 10 MG tablet Take 10 mg by mouth daily.  [provider]  meclizine (ANTIVERT) 25 MG tablet Take 1 tablet (25 mg total) by mouth 3 (three) times daily as needed for dizziness. 01/15/17   Francine Graven, DO  Multiple Vitamin (MULTIVITAMIN) tablet Take 1 tablet by mouth daily. Centrum Mens    [provider]  NITROSTAT 0.4 MG SL tablet DISSOLVE ONE TABLET UNDER THE TONGUE EVERY 5 MINUTES AS NEEDED FOR CHEST PAIN.  DO NOT EXCEED A TOTAL OF 3 DOSES IN 15 MINUTES. NO RELIEF, C 10/11/21    Nahser, Wonda Cheng, MD  rosuvastatin (CRESTOR) 20 MG tablet Take 1 tablet by mouth once daily 11/04/21   Nahser, Wonda Cheng, MD      Allergies    Ampicillin and Iodinated contrast media    Review of Systems   Review of Systems  Constitutional:  Positive for unexpected weight change.  HENT:  Positive for hearing loss and trouble swallowing.   Respiratory:  Positive for shortness of breath.   Cardiovascular:  Negative for chest pain and leg swelling.  Gastrointestinal:  Positive for abdominal pain and constipation. Negative for diarrhea, nausea and vomiting.  Genitourinary:  Negative for dysuria.  Neurological:  Positive for speech difficulty. Negative for facial asymmetry.    Physical Exam Updated Vital Signs BP (!) 113/59 (BP Location: Left Arm)   Pulse 78   Temp 98 F (36.7 C) (Oral)   Resp 20   SpO2 96%  Physical Exam Vitals and nursing note reviewed.  Constitutional:      Appearance: He is well-developed. He is ill-appearing.  HENT:     Head: Normocephalic and atraumatic.     Mouth/Throat:     Mouth: Mucous membranes are moist.  Eyes:     Conjunctiva/sclera: Conjunctivae normal.  Cardiovascular:     Rate and Rhythm: Normal rate and regular rhythm.     Pulses: Normal pulses.     Heart sounds: No murmur heard. Pulmonary:     Effort: Pulmonary effort is normal. No respiratory distress.     Breath sounds: Normal breath sounds.  Abdominal:     General: Bowel sounds are normal. There is no distension.     Palpations: Abdomen is soft.     Tenderness: There is no abdominal tenderness.  Musculoskeletal:        General: No swelling or tenderness.     Cervical back: Normal range of motion and neck supple.  Skin:    General: Skin is warm and dry.     Capillary Refill: Capillary refill takes less than 2 seconds.  Neurological:     General: No focal deficit present.     Mental Status: He is alert.  Psychiatric:        Mood and Affect: Mood normal.     ED Results /  Procedures / Treatments   Labs (all labs ordered are listed, but only abnormal results are displayed) Labs Reviewed  COMPREHENSIVE METABOLIC PANEL - Abnormal; Notable for the following components:      Result Value   Chloride 94 (*)    Glucose, Bld 109 (*)    AST 340 (*)    ALT 220 (*)    Total Bilirubin 2.7 (*)    All other components within normal limits  CBC - Abnormal; Notable for the following components:   WBC 14.6 (*)    All other components within normal limits  URINALYSIS, ROUTINE W REFLEX MICROSCOPIC - Abnormal; Notable for the following components:   Color, Urine AMBER (*)    All other  components within normal limits  LIPASE, BLOOD  BRAIN NATRIURETIC PEPTIDE  HEPATITIS PANEL, ACUTE  ACETAMINOPHEN LEVEL  TROPONIN I (HIGH SENSITIVITY)  TROPONIN I (HIGH SENSITIVITY)    EKG EKG Interpretation  Date/Time:  Sunday August 13 2022 07:58:54 EDT Ventricular Rate:  87 PR Interval:  172 QRS Duration: 88 QT Interval:  372 QTC Calculation: 447 R Axis:   64 Text Interpretation: Normal sinus rhythm Normal ECG When compared with ECG of 29-Dec-2021 18:15, PREVIOUS ECG IS PRESENT No significant change since last tracing Confirmed by Blanchie Dessert [phone]) on 08/13/2022 11:44:57 AM  Radiology DG Chest 2 View  Result Date: 08/13/2022 CLINICAL DATA:  Shortness of breath EXAM: CHEST - 2 VIEW COMPARISON:  12/03/2017 FINDINGS: The heart size and mediastinal contours are within normal limits. Aortic atherosclerosis. No focal airspace consolidation, pleural effusion, or pneumothorax. The visualized skeletal structures are unremarkable. IMPRESSION: No active cardiopulmonary disease. Electronically Signed   By: Davina Poke D.O.   On: 08/13/2022 09:58    Procedures Procedures    Medications Ordered in ED Medications  methylPREDNISolone sodium succinate (SOLU-MEDROL) 40 mg/mL injection 40 mg (40 mg Intravenous Given 08/13/22 1033)  diphenhydrAMINE (BENADRYL) capsule 50 mg (  Oral See Alternative 08/13/22 1328)    Or  diphenhydrAMINE (BENADRYL) injection 50 mg (50 mg Intravenous Given 08/13/22 1328)  sodium chloride 0.9 % bolus 1,000 mL (0 mLs Intravenous Stopped 08/13/22 1327)  sodium chloride 0.9 % bolus 1,000 mL (1,000 mLs Intravenous New Bag/Given 08/13/22 1327)    ED Course/ Medical Decision Making/ A&P                           Medical Decision Making Amount and/or Complexity of Data Reviewed Labs: ordered. Radiology: ordered.  Risk Prescription drug management.   This patient presents to the ED for concern of abdominal pain and shortness of breath, this involves an extensive number of treatment options, and is a complaint that carries with it a high risk of complications and morbidity.  The differential diagnosis includes malignancy, pulmonary embolism, ACS, heart failure, fluid overload, and many others   Co morbidities that complicate the patient evaluation  History of coronary angioplasty with stent, AAA   Additional history obtained:  Additional history obtained from daughter-in-law and wife External records from outside source obtained and reviewed including care summary from the patient's primary care provider.  Also reviewed labs from [facility] from approximately 120.  Lab work at that time showed no elevation in white blood cell count or liver enzymes   Lab Tests:  I Ordered, and personally interpreted labs.  The pertinent results include: AST 340, ALT 220, T. bili 2.7, WBC 14.6, unremarkable troponin, BNP, lipase   Imaging Studies ordered:  I ordered imaging studies including chest x-ray, CT soft tissue neck, chest, abdomen, and pelvis with contrast I independently visualized and interpreted imaging which showed no active cardiopulmonary disease on chest x-ray I agree with the radiologist interpretation   Cardiac Monitoring: / EKG:  The patient was maintained on a cardiac monitor.  I personally viewed and interpreted the cardiac  monitored which showed an underlying rhythm of: Normal sinus rhythm    Problem List / ED Course / Critical interventions / Medication management   I ordered medication including Solu-Medrol and Benadryl for pre-CT contrast reaction prophylaxis, saline for possible dehydration Reevaluation of the patient after these medicines showed that the patient improved I have reviewed the patients home medicines and have made adjustments  as needed   Test / Admission - Considered:  The patient has respiratory distress requiring oxygen to maintain oxygen saturations above 90%.  At this time CT scans have not been completed.  I feel that the patient will likely need admission at minimum for further evaluation of his new oxygen requirement.  Pending CT results he may need admission for other reasons as well.  Patient care will be transferred to Domenic Moras, PA-C at shift handoff.        Final Clinical Impression(s) / ED Diagnoses Final diagnoses:  Periumbilical abdominal pain  Shortness of breath    Rx / DC Orders ED Discharge Orders     None         Ronny Bacon 08/13/22 1510    Blanchie Dessert, MD 08/16/22 [phone]

## 2022-08-13 NOTE — ED Provider Notes (Signed)
Received signout from previous provider, please see his note for complete H&P.  This is a 76 year old male who presents with complaints of abdominal pain and shortness of breath that started earlier today.  Patient was noted to have initial pulse ox reading of 82%.  He is not on any home oxygen.  He also reported approximately 50 pound weight loss since earlier this year and he also having decreased appetite with difficulty swallowing.  Vital signs at this time shows an oxygen of 97% on 2 L of supplemental oxygen.  No fever.  Patient is tachypneic with a respiratory rate of 34.  Labs, EKG, and imaging obtained independently viewed interpreted by me and agree with radiology interpretation.  Urinalysis without signs of urinary tract infection.  Negative delta troponin.  Doubt ACS.  Normal BNP, doubt CHF exacerbation.  Patient however appears to have new transaminitis with AST 340, ALT 220 and a total bili of 2.7.  Hepatitis panel and Tylenol level have been ordered.  Patient has an elevated white count of 14.6.  He is currently awaits CT scan of his neck soft tissue as well as CT scan of the chest abdomen pelvis as part of his work-up.  5:21 PM CT scan of the neck as well as CT scan of the chest abdomen pelvis was obtained independently visualized interpreted by me and I agree with radiologist interpretation.  Neck CT scan without any acute finding.  CT scan of the chest abdomen pelvis demonstrate mild aneurysmal dilatations at the ascending aorta measuring 4 cm as well infra abdominal aortic aneurysm measuring 3.4 cm minimally increased from 3.3 cm from October 2022.  Question mild pericholecystic edema without any radiopaque gallbladder calculi.  Lung shows bibasilar atelectasis versus peribronchial airspace consolidation.  On reassessment patient reported his abdominal pain has resolved.  No reproducible abdominal tenderness on exam.  Negative Murphy sign.  Due to elevated white count and transaminitis,  limited abdominal ultrasound have been ordered by me.  Since patient has had cough along with shortness of breath as well as new oxygen requirement, will treat for suspected pneumonia with Rocephin and Zithromax.  Will consult for admission.  5:42 PM Appreciate consultation from Internal Medicine resident team who agrees to see and will admit pt for further management of his condition.  His limited abd [place] is currently pending.    .Critical Care  Performed by: Domenic Moras, PA-C Authorized by: Domenic Moras, PA-C   Critical care provider statement:    Critical care time (minutes):  30   Critical care was time spent personally by me on the following activities:  Development of treatment plan with patient or surrogate, discussions with consultants, evaluation of patient's response to treatment, examination of patient, ordering and review of laboratory studies, ordering and review of radiographic studies, ordering and performing treatments and interventions, pulse oximetry, re-evaluation of patient's condition and review of old charts  BP (!) 114/53   Pulse 76   Temp 98 F (36.7 C) (Oral)   Resp (!) 34   SpO2 97%   Results for orders placed or performed during the hospital encounter of 08/13/22  Lipase, blood  Result Value Ref Range   Lipase 36 11 - 51 U/L  Comprehensive metabolic panel  Result Value Ref Range   Sodium 136 135 - 145 mmol/L   Potassium 3.6 3.5 - 5.1 mmol/L   Chloride 94 (L) 98 - 111 mmol/L   CO2 31 22 - 32 mmol/L   Glucose, Bld 109 (H) 70 -  99 mg/dL   BUN 10 8 - 23 mg/dL   Creatinine, Ser 0.79 0.61 - 1.24 mg/dL   Calcium 9.5 8.9 - 10.3 mg/dL   Total Protein 6.8 6.5 - 8.1 g/dL   Albumin 4.2 3.5 - 5.0 g/dL   AST 340 (H) 15 - 41 U/L   ALT 220 (H) 0 - 44 U/L   Alkaline Phosphatase 57 38 - 126 U/L   Total Bilirubin 2.7 (H) 0.3 - 1.2 mg/dL   GFR, Estimated >60 >60 mL/min   Anion gap 11 5 - 15  CBC  Result Value Ref Range   WBC 14.6 (H) 4.0 - 10.5 K/uL   RBC 4.42 4.22 -  5.81 MIL/uL   Hemoglobin 13.5 13.0 - 17.0 g/dL   HCT 41.0 39.0 - 52.0 %   MCV 92.8 80.0 - 100.0 fL   MCH 30.5 26.0 - 34.0 pg   MCHC 32.9 30.0 - 36.0 g/dL   RDW 13.5 11.5 - 15.5 %   Platelets 224 150 - 400 K/uL   nRBC 0.0 0.0 - 0.2 %  Urinalysis, Routine w reflex microscopic Urine, Clean Catch  Result Value Ref Range   Color, Urine AMBER (A) YELLOW   APPearance CLEAR CLEAR   Specific Gravity, Urine 1.011 1.005 - 1.030   pH 6.0 5.0 - 8.0   Glucose, UA NEGATIVE NEGATIVE mg/dL   Hgb urine dipstick NEGATIVE NEGATIVE   Bilirubin Urine NEGATIVE NEGATIVE   Ketones, ur NEGATIVE NEGATIVE mg/dL   Protein, ur NEGATIVE NEGATIVE mg/dL   Nitrite NEGATIVE NEGATIVE   Leukocytes,Ua NEGATIVE NEGATIVE  Brain natriuretic peptide  Result Value Ref Range   B Natriuretic Peptide 64.5 0.0 - 100.0 pg/mL  Troponin I (High Sensitivity)  Result Value Ref Range   Troponin I (High Sensitivity) 6 <18 ng/L  Troponin I (High Sensitivity)  Result Value Ref Range   Troponin I (High Sensitivity) 7 <18 ng/L   CT Soft Tissue Neck W Contrast  Result Date: 08/13/2022 CLINICAL DATA:  Difficulty swallowing. EXAM: CT NECK WITH CONTRAST TECHNIQUE: Multidetector CT imaging of the neck was performed using the standard protocol following the bolus administration of intravenous contrast. RADIATION DOSE REDUCTION: This exam was performed according to the departmental dose-optimization program which includes automated exposure control, adjustment of the mA and/or kV according to patient size and/or use of iterative reconstruction technique. CONTRAST:  69m OMNIPAQUE IOHEXOL 350 MG/ML SOLN COMPARISON:  None Available. FINDINGS: Pharynx and larynx: No focal mucosal or submucosal lesions are present. Nasopharynx is clear. Soft palate and tongue base are within normal limits. Vallecula and epiglottis are within normal limits. Aryepiglottic folds and piriform sinuses are clear. Vocal cords are midline and symmetric. Trachea is clear.  Salivary glands: The submandibular and parotid glands and ducts are within normal limits. Thyroid: Normal Lymph nodes: No significant adenopathy is present. Vascular: Atherosclerotic calcifications are present at the carotid bifurcations bilaterally without significant stenosis Limited intracranial: Within normal limits. Mastoids and visualized paranasal sinuses: The paranasal sinuses and mastoid air cells are clear. Skeleton: Vertebral body heights alignment are normal. No focal osseous lesions are present. Upper chest: The lung apices are clear. The thoracic inlet is within normal limits. IMPRESSION: 1. No acute or focal lesion to explain the patient's symptoms. 2. Atherosclerotic changes at the carotid bifurcations bilaterally without significant stenosis. Electronically Signed   By: CSan MorelleM.D.   On: 08/13/2022 16:02   CT CHEST ABDOMEN PELVIS W CONTRAST  Result Date: 08/13/2022 CLINICAL DATA:  Unintentional weight loss.  EXAM: CT CHEST, ABDOMEN, AND PELVIS WITH CONTRAST TECHNIQUE: Multidetector CT imaging of the chest, abdomen and pelvis was performed following the standard protocol during bolus administration of intravenous contrast. RADIATION DOSE REDUCTION: This exam was performed according to the departmental dose-optimization program which includes automated exposure control, adjustment of the mA and/or kV according to patient size and/or use of iterative reconstruction technique. CONTRAST:  67m OMNIPAQUE IOHEXOL 350 MG/ML SOLN COMPARISON:  September 07, 2021 FINDINGS: CT CHEST FINDINGS Cardiovascular: Mild aneurysmal dilation of the ascending aorta measures 4 cm in greatest diameter. The aortic arch measures 3 cm. The descending aorta measures 3.5 cm in greatest dimension. Tortuosity and calcific atherosclerotic disease of the aorta. Enlarged cardiac silhouette. No significant pericardial effusion. Calcific atherosclerotic disease of the coronary arteries. Mediastinum/Nodes: No enlarged  mediastinal, hilar, or axillary lymph nodes. Thyroid gland, trachea, and esophagus demonstrate no significant findings. Lungs/Pleura: Bibasilar atelectasis versus peribronchial airspace consolidation. Musculoskeletal: No chest wall mass or suspicious bone lesions identified. CT ABDOMEN PELVIS FINDINGS Hepatobiliary: Normal appearance of the liver. Question mild pericholecystic edema. No radiopaque gallbladder calculi. No evidence of intra or extrahepatic biliary ductal dilation. Pancreas: Unremarkable. No pancreatic ductal dilatation or surrounding inflammatory changes. Spleen: Normal in size without focal abnormality. Adrenals/Urinary Tract: Tiny nonobstructive renal calculi versus vascular calcifications in the upper pole of the right kidney. Normal adrenal glands. Normal ureters and urinary bladder. Stomach/Bowel: Stomach is within normal limits. No evidence of appendicitis. No evidence of bowel wall thickening, distention, or inflammatory changes. Vascular/Lymphatic: Calcific atherosclerotic disease and tortuosity of the aorta. Infrarenal abdominal aortic aneurysm measures 3.4 cm in greatest diameter, minimally increased from 3.3 cm on September 07, 2021. There has been placement of aorto bi iliac stent graft. There is an increased eccentric nonocclusive mural thrombus within the aneurysmal aorta. Interval coiling of left iliac artery aneurysm. Reproductive: The prostate gland is moderately enlarged measuring 5.6 cm. Other: No abdominal wall hernia or abnormality. No abdominopelvic ascites. Musculoskeletal: No acute or significant osseous findings. IMPRESSION: 1. Mild aneurysmal dilation of the ascending aorta measures 4 cm in greatest diameter. Recommend annual imaging followup by CTA or MRA. This recommendation follows 2010 ACCF/AHA/AATS/ACR/ASA/SCA/SCAI/SIR/STS/SVM Guidelines for the Diagnosis and Management of Patients with Thoracic Aortic Disease. Circulation. 2010; 121:: B353-G992 Aortic aneurysm NOS  (ICD10-I71.9) 2. Infrarenal abdominal aortic aneurysm measures 3.4 cm in greatest diameter, minimally increased from 3.3 cm on September 07, 2021. Post placement of aorto-bi iliac stent graft with increased eccentric nonocclusive mural thrombus within the aneurysmal aorta. Interval coiling of left iliac artery aneurysm. Recommend follow-up ultrasound every 3 years. This recommendation follows ACR consensus guidelines: White Paper of the ACR Incidental Findings Committee II on Vascular Findings. J Am Coll Radiol 2013; 10:789-794. 3. Question mild pericholecystic edema. No radiopaque gallbladder calculi. Further evaluation with right upper quadrant ultrasound may be considered. 4. Tiny nonobstructive renal calculi versus vascular calcifications in the upper pole of the right kidney. 5. Moderate prostatomegaly.  Please correlate to PSA values. Aortic Atherosclerosis (ICD10-I70.0). Electronically Signed   By: DFidela SalisburyM.D.   On: 08/13/2022 15:55   DG Chest 2 View  Result Date: 08/13/2022 CLINICAL DATA:  Shortness of breath EXAM: CHEST - 2 VIEW COMPARISON:  12/03/2017 FINDINGS: The heart size and mediastinal contours are within normal limits. Aortic atherosclerosis. No focal airspace consolidation, pleural effusion, or pneumothorax. The visualized skeletal structures are unremarkable. IMPRESSION: No active cardiopulmonary disease. Electronically Signed   By: NDavina PokeD.O.   On: 08/13/2022 09:58   VAS [place]ORTA/IVC/ILIACS  Result Date: 07/31/2022 Endovascular Aortic Repair Study (EVAR) Patient Name:  Donald Mckinney  Date of Exam:   07/31/2022 Medical Rec #: [id number]        Accession #:    [id number] Date of Birth: [birth year]        Patient Gender: M Patient Age:   62 years Exam Location:  [facility] Procedure:      VAS US AORTA/IVC/ILIACS Referring Phys: Harold Barban --------------------------------------------------------------------------------  Risk Factors: Hypertension,  hyperlipidemia, past history of smoking. Other Factors: Known AAA                PAD. Vascular Interventions: 01/17/22: Coil embolization left hypogastric artery                         Stent, left common iliac artery                         Stent, left external iliac artery                         History of prior kissing CIA stents.  Performing Technologist: Ralene Cork RVT  Examination Guidelines: A complete evaluation includes B-mode imaging, spectral Doppler, color Doppler, and power Doppler as needed of all accessible portions of each vessel. Bilateral testing is considered an integral part of a complete examination. Limited examinations for reoccurring indications may be performed as noted.  Abdominal Aorta Findings: +-------------+-------+----------+----------+----------+--------+--------+ Location     AP (cm)Trans (cm)PSV (cm/s)Waveform  ThrombusComments +-------------+-------+----------+----------+----------+--------+--------+ Distal       2.97   3.33      28                                   +-------------+-------+----------+----------+----------+--------+--------+ RT CIA Prox                   32        monophasic                 +-------------+-------+----------+----------+----------+--------+--------+ RT CIA Mid                    55        monophasic                 +-------------+-------+----------+----------+----------+--------+--------+ RT CIA Distal                 56        monophasic                 +-------------+-------+----------+----------+----------+--------+--------+ RT EIA Mid                    90        monophasic                 +-------------+-------+----------+----------+----------+--------+--------+ LT CIA Prox                   63        monophasic                 +-------------+-------+----------+----------+----------+--------+--------+ LT CIA Mid                    69        monophasic                  +-------------+-------+----------+----------+----------+--------+--------+  LT CIA Distal                 66        monophasic                 +-------------+-------+----------+----------+----------+--------+--------+ LT EIA Mid                    101       monophasic                 +-------------+-------+----------+----------+----------+--------+--------+  Summary: Stenosis: Patent bilateral CIA stents with no stenosis seen. AAA 2.97 x 3.33 .cm  *See table(s) above for measurements and observations.  Electronically signed by Harold Barban MD on 07/31/2022 at 3:27:42 PM.    Final        Domenic Moras, PA-C 08/13/22 1743    Malvin Johns, MD 08/13/22 2133

## 2022-08-13 NOTE — Hospital Course (Addendum)
SOB -noticed back in Feb. -worse w lying down. Props up w pillows for years. No LE edema.  -Chills -Can't take full deep breath with nasal congestion and ear pressure. Can walk 500 ft without taking breaks.   ABD -hard and stiff and painful at 1:30. Feel like going to pass out.  "Throbbing, aching". -Nausea, no Emesis -epigastric, no radiation,  -Walking helps -Pain has resolved Never had it b4  DYSPHAGIA -since Feb + trouble swallow. Both solid and liquid.  -post nasal drip. Dry food clogged oropharyngeal -Stroke was ruled out at hospital. Saw Neurology => speech therapist =>  -Lost 35lbs in 1 year -No night sweats -Constipation, ? Melena or hematchozia -No EGD and COLO bc family has bad hx with colo.  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 3p/d for years -Occasional alcohol after feb. Used to drink 6pack/d before Feb for 10 years - No drugs  Fam: -Dad: brain tumor and stomach Ca, died at 17 -Brother: pancreatic CA 73 -Younger brother: MS  ?worsening dysarthria. No aphasia  08/15/22 subjective: Feeling good. Continues to expectorate copious amounts of mucus. Family note that he desaturated during sleep overnight. Oxygen was administered to good effect. Wondering about next steps. Would like an earlier ENT appointment than what is currently scheduled. Family's questions regarding the patient's condition and hospital course were answered.

## 2022-08-13 NOTE — H&P (Addendum)
Date: 08/13/2022               Patient Name:  Donald Mckinney MRN: [id number]  DOB: 1945/12/13 Age / Sex: 76 y.o., male   PCP: Cecile Sheerer, NP         Medical Service: Internal Medicine Teaching Service         Attending Physician: Dr. Lucious Groves, DO    First Contact: Dr. Roswell Nickel, MD      Pager: [phone]      Second Contact: Lorine Bears     Pager: Liliane Shi [phone]        After Hours (After 5p/  First Contact Pager: [phone]  weekends / holidays): Second Contact Pager: [phone]   SUBJECTIVE   Chief Complaint: Abdominal pain and Shortness of breath   History of Present Illness: This is a 76 year old male with a past medical history of coronary angioplasty with stenting, skin cancer, iliac artery stenting, PVD, hypertension, infrarenal abdominal aortic aneurysm, iliac artery aneurysm presenting to the emergency room with concerns of abdominal pain and shortness of breath.  Patient reports that he had an episode of abdominal pain, with hardening of his abdomen last night around 1 AM.  He states that he got up from bed, and noticed that he had this throbbing type abdominal pain.  He felt cold sweats and noticed that he was going to pass out.  Patient does report that he also took 2 Gas-X at the time.  He also reports that he was nauseous and dry heaving but denies any vomiting.  He reports that he also felt like he had to have a bowel movement.  This was self resolving.  Patient reports a 78-monthhistory of shortness of breath, dysphagia, dysarthria, and weight loss.  He reports that this dysphagia started about 7 months ago, along with dysarthria.  He was examined for this, and at the time it was not found to be a stroke.  Patient does inform me that he feels choked up when drinking and eating.  He attributes this to postnasal drip, which causes food to get stuck in his oropharyngeal area.  Patient followed with speech therapy and ENT, with no explanation for this.  Patient  also reports shortness of breath for the past 7 months, which gets worse with laying down.  He reports that he can't take full deep breath with nasal congestion and ear pressure. He can walk 500 ft without taking breaks.  He denies any hemoptysis, hematemesis, or NSAID use.  Regarding his weight loss, he feels that this is attributed to him not being able to swallow well.  He reports a 35 pound weight loss in the past year.  He denies any history of endoscopy or colonoscopy.  Patient reports that his mother and brother had complications due to colonoscopy, and refuses to get one due to this.  Patient denies any fever, chills, sore throat, chest pain, abdominal pain, nausea, vomiting, diarrhea, melena, hematochezia, bloody vomit, urinary symptoms, changes in bladder or bowel habits or lower extremity edema.  He does endorse some blurry vision and hearing loss.  ED Course: Patient presented with initial vital signs showing temperature of 98.5 F, pulse of 90, respiratory rate 16, blood pressure 108/67, and O2 saturation 90% on room air.  Patient seems to desaturate in the emergency room requiring 3 L nasal cannula.  Patient also became hypotensive in the emergency room with lowest blood pressure at 90/52.  Initial labs showing negative lipase.  Elevated  white count at 14.6.  Elevated liver enzymes with AST 340.  ALT 220.  Elevated T. bili at 2.7.  Troponin negative x2.  BNP 64.5.  UA negative.  Chest x-ray negative. CT soft tissue neck negative. CT Chest abdomen pelvis showing mild aneurysm dilatation of ascending aorta, infrarenal abdominal aortic aneurysm, mild pericholecystic edema, and some nonobstructive renal calculi.  Patient bolused 1 L normal saline in the emergency department admitted for further work-up.  Meds:  Current Meds  Medication Sig   carvedilol (COREG) 6.25 MG tablet Take 1 tablet (6.25 mg total) by mouth 2 (two) times daily with a meal.   Chlorphen-Phenyleph-APAP (CORICIDIN D  COLD/FLU/SINUS) 2-5-325 MG TABS Take 1 tablet by mouth daily as needed (Cold).   cilostazol (PLETAL) 100 MG tablet Take 1 tablet (100 mg total) by mouth 2 (two) times daily.   clopidogrel (PLAVIX) 75 MG tablet Take 1 tablet by mouth once daily   diphenhydrAMINE (BENADRYL ALLERGY) 25 MG tablet Take 50 mg by mouth every 6 (six) hours as needed for allergies.   fenofibrate (TRICOR) 145 MG tablet Take 1 tablet (145 mg total) by mouth daily. Please keep upcoming appointment for additional refills. Thank you.   fluticasone (FLONASE) 50 MCG/ACT nasal spray Place 1 spray into both nostrils daily.   Isopropyl Alcohol (SWIMMERS EAR DROPS OT) Place 2 drops into both ears as needed (for swimmer ear).   lisinopril (ZESTRIL) 5 MG tablet Take 1 tablet (5 mg total) by mouth daily.   loratadine (CLARITIN) 10 MG tablet Take 10 mg by mouth daily.   meclizine (ANTIVERT) 25 MG tablet Take 1 tablet (25 mg total) by mouth 3 (three) times daily as needed for dizziness.   Multiple Vitamin (MULTIVITAMIN) tablet Take 1 tablet by mouth daily. Centrum Mens   rosuvastatin (CRESTOR) 20 MG tablet Take 1 tablet by mouth once daily    Past Medical History  Past Surgical History:  Procedure Laterality Date   ABDOMINAL AORTOGRAM N/A 01/17/2022   Procedure: ABDOMINAL AORTOGRAM;  Surgeon: Serafina Mitchell, MD;  Location: [facility] [place] [facility];  Service: Cardiovascular;  Laterality: N/A;   ABDOMINAL AORTOGRAM W/LOWER EXTREMITY N/A 10/30/2017   Procedure: ABDOMINAL AORTOGRAM W/LOWER EXTREMITY;  Surgeon: Waynetta Sandy, MD;  Location: [place] [facility];  Service: Cardiovascular;  Laterality: N/A;   CARDIAC CATHETERIZATION     WELL PRESERVED LEFT VENTRICULAR SYSTOLIC FUNCTION. EF 55%   CORONARY ANGIOPLASTY WITH STENT PLACEMENT     RIGHT CORONARY ARTERY, LEFT ANTERIOR DESCENDING ARTERY   CORONARY ARTERY BYPASS GRAFT     EMBOLIZATION  01/17/2022   Procedure: EMBOLIZATION;  Surgeon: Serafina Mitchell, MD;  Location: [place] [facility];  Service: Cardiovascular;;   iliac artery stenting Bilateral Jan. 7, 2010   Bilateral CIA and Right External IA  stents    PELVIC ANGIOGRAPHY  01/17/2022   Procedure: PELVIC ANGIOGRAPHY;  Surgeon: Serafina Mitchell, MD;  Location: [place] [facility];  Service: Cardiovascular;;   PERIPHERAL VASCULAR INTERVENTION Left 01/17/2022   Procedure: PERIPHERAL VASCULAR INTERVENTION;  Surgeon: Serafina Mitchell, MD;  Location: [place] [facility];  Service: Cardiovascular;  Laterality: Left;  COMMON AND INT ILIAC   PRE-MALIGNANT / BENIGN SKIN LESION EXCISION Left Oct. 27, 2015   Posterior Neck- Benign mole    Social:  Lives With: With wife at home Occupation: Psychologist, sport and exercise Support: Good support at home Level of Function: Independent in all ADLs and IADLs PCP: Delphina Cahill Substances: Patient is a former smoker. Smoking in  2009.  Patient smoked for 40 years, and smoked up to 3 packs/day.  Patient does report alcohol use and recently has cut down a lot in past 7 months.  He reports that for the past 10 years he will drink 6 pack of beer per day.  No other drug use.  Family History:  -Dad: brain tumor and stomach Ca, died at 4 -Brother: pancreatic CA 59 -Younger brother: MS  Allergies: Allergies as of 08/13/2022 - Review Complete 08/13/2022  Allergen Reaction Noted   Ampicillin Other (See Comments) 04/26/2011   Iodinated contrast media Itching and Rash 04/26/2011    Review of Systems: A complete ROS was negative except as per HPI.   OBJECTIVE:   Physical Exam: Blood pressure (!) 114/53, pulse 76, temperature 98 F (36.7 C), temperature source Oral, resp. rate (!) 34, SpO2 97 %.  Constitutional: Patient is resting comfortably in bed upon my exam.  He does have 3 L nasal cannula flowing.  On exam, I did turn the oxygen off and patient maintain saturations at 91% or higher.  Patient is dysarthric. HENT: normocephalic atraumatic.  Dry mucous membranes.  Eyes: conjunctiva  non-erythematous Cardiovascular: regular rate and rhythm, no m/r/g Pulmonary/Chest: Tachypneic on exam.  Expiratory wheezing noted. Abdominal: soft, non-tender, non-distended MSK: normal bulk and tone.  Faint pedal pulses.  Cold bilateral lower extremities Neurological: Alert oriented x3.  5/5 strength to bilateral upper and lower extremities.  Good sensation intact.  Finger-nose intact.  Heel-to-shin intact.  Cranial nerves II through XII intact.  Labs: CBC    Component Value Date/Time   WBC 14.6 (H) 08/13/2022 0800   RBC 4.42 08/13/2022 0800   HGB 13.5 08/13/2022 0800   HCT 41.0 08/13/2022 0800   PLT 224 08/13/2022 0800   MCV 92.8 08/13/2022 0800   MCH 30.5 08/13/2022 0800   MCHC 32.9 08/13/2022 0800   RDW 13.5 08/13/2022 0800   LYMPHSABS 2.1 12/29/2021 1820   MONOABS 0.5 12/29/2021 1820   EOSABS 0.3 12/29/2021 1820   BASOSABS 0.1 12/29/2021 1820     CMP     Component Value Date/Time   NA 136 08/13/2022 0800   NA 141 08/03/2021 0844   K 3.6 08/13/2022 0800   CL 94 (L) 08/13/2022 0800   CO2 31 08/13/2022 0800   GLUCOSE 109 (H) 08/13/2022 0800   BUN 10 08/13/2022 0800   BUN 10 08/03/2021 0844   CREATININE 0.79 08/13/2022 0800   CREATININE 0.96 10/10/2016 0921   CALCIUM 9.5 08/13/2022 0800   PROT 6.8 08/13/2022 0800   PROT 7.4 08/03/2021 0844   ALBUMIN 4.2 08/13/2022 0800   ALBUMIN 4.9 (H) 08/03/2021 0844   AST 340 (H) 08/13/2022 0800   ALT 220 (H) 08/13/2022 0800   ALKPHOS 57 08/13/2022 0800   BILITOT 2.7 (H) 08/13/2022 0800   BILITOT 0.4 08/03/2021 0844   GFRNONAA >60 08/13/2022 0800   GFRAA 100 08/02/2020 0938    Imaging: Chest x-ray: negative CT soft tissue neck: negative CT Chest abdomen pelvis: showing mild aneurysm dilatation of ascending aorta, infrarenal abdominal aortic aneurysm, mild pericholecystic edema, and some nonobstructive renal calculi.  EKG: personally reviewed my interpretation is ECG showing normal sinus rhythm with a rate of 84.  Normal  axis.  Normal intervals.  No ischemic changes noted.  Same from previous.  ASSESSMENT & PLAN:   Assessment & Plan by Problem: Principal Problem:   Acute respiratory failure with hypoxia (HCC)   NASER SCHULD is a 76 y.o. male with  a past medical history of coronary angioplasty with stenting, skin cancer, iliac artery stenting, PVD, hypertension, infrarenal abdominal aortic aneurysm, iliac artery aneurysm presenting to the emergency room with concerns of abdominal pain and shortness of breath.  Patient found to have elevated liver enzymes.  Patient admitted for further work-up.  #Acute hypoxic respiratory failure Patient reporting a 36-monthhistory of shortness of breath.  Worse with lying flat.  Initial O2 saturations on room air in the high 80s.  Patient is short of breath with respirations in the 30s.  Patient is requiring 2 to 3 L of oxygen.  Patient also has a longstanding history of smoking tobacco.  On exam, patient does have expiratory wheezing.  This could be COPD.  Given history of dysphagia, and choking, this could also be aspiration pneumonia.  Also could be considered with viral pneumonia.  CT scan did show bibasilar atelectasis versus peribronchial airspace consolidation.  We will continue to work-up.  Patient currently satting in low 90s on room air. -We will start empiric antibiotics for aspiration pneumonia with azithromycin, Flagyl, and ceftriaxone -Echo pending -Wean oxygen as tolerated -Respiratory panel pending -DuoNebs as needed -Start Anoro Ellipta  #Elevated liver enzymes #Hyperbilirubinemia Patient with elevated liver enzymes with AST at 340.  ALT 220.  Total bilirubin 2.7.  CT scan did not show anything.  Right upper quadrant ultrasound unremarkable.  This could be transient due to possible aspiration pneumonia or current illness. -Continue to monitor -Hepatitis panel pending -Acetaminophen level pending  #Dysphagia and dysarthria #Weight loss Patient reports a  752-monthistory of dysarthria and dysphagia.  Patient did have a MRI brain and CT head on 12/29/2021 showing no acute intracranial abnormalities.  Which did not show any stroke.  Due to this dysphagia, patient not been eating well.  This has caused him to lose 35 pounds in the past year.  Patient reports that his dysarthria and dysphagia has been worsening.  Consider MRI. -Speech evaluation pending -Barium swallow pending -Dysphagia 1 diet -Consider MRI  #Coronary artery disease Patient does have a history of MI back in 2009. -Resume home Coreg 6.5 mg BID -Resume home Plavix 75 mg daily  #PAD -Resume home Plavix 75 mg daily  Diet: Dysphagia 1 diet VTE: Enoxaparin IVF: N/A Code: Full  Prior to Admission Living Arrangement: Home, living with wife  Anticipated Discharge Location: Home Barriers to Discharge: Clinical improvement  Dispo: Admit patient to Inpatient with expected length of stay greater than 2 midnights.  Signed: PaLeigh AuroraDO Internal Medicine Resident PGY-1 [phone]/24/2023, 7:22 PM

## 2022-08-14 ENCOUNTER — Telehealth (HOSPITAL_COMMUNITY): Payer: Self-pay | Admitting: Pharmacy Technician

## 2022-08-14 ENCOUNTER — Other Ambulatory Visit (HOSPITAL_COMMUNITY): Payer: Self-pay

## 2022-08-14 ENCOUNTER — Observation Stay (HOSPITAL_COMMUNITY): Payer: Medicare PPO

## 2022-08-14 DIAGNOSIS — I252 Old myocardial infarction: Secondary | ICD-10-CM | POA: Diagnosis not present

## 2022-08-14 DIAGNOSIS — R0602 Shortness of breath: Secondary | ICD-10-CM | POA: Diagnosis present

## 2022-08-14 DIAGNOSIS — J9601 Acute respiratory failure with hypoxia: Secondary | ICD-10-CM | POA: Diagnosis present

## 2022-08-14 DIAGNOSIS — J690 Pneumonitis due to inhalation of food and vomit: Secondary | ICD-10-CM | POA: Diagnosis present

## 2022-08-14 DIAGNOSIS — Z85828 Personal history of other malignant neoplasm of skin: Secondary | ICD-10-CM | POA: Diagnosis not present

## 2022-08-14 DIAGNOSIS — Z20822 Contact with and (suspected) exposure to covid-19: Secondary | ICD-10-CM | POA: Diagnosis present

## 2022-08-14 DIAGNOSIS — R7401 Elevation of levels of liver transaminase levels: Secondary | ICD-10-CM | POA: Diagnosis not present

## 2022-08-14 DIAGNOSIS — I2510 Atherosclerotic heart disease of native coronary artery without angina pectoris: Secondary | ICD-10-CM

## 2022-08-14 DIAGNOSIS — D72829 Elevated white blood cell count, unspecified: Secondary | ICD-10-CM | POA: Diagnosis present

## 2022-08-14 DIAGNOSIS — R471 Dysarthria and anarthria: Secondary | ICD-10-CM

## 2022-08-14 DIAGNOSIS — R1319 Other dysphagia: Secondary | ICD-10-CM | POA: Diagnosis not present

## 2022-08-14 DIAGNOSIS — R17 Unspecified jaundice: Secondary | ICD-10-CM | POA: Diagnosis present

## 2022-08-14 DIAGNOSIS — J449 Chronic obstructive pulmonary disease, unspecified: Secondary | ICD-10-CM | POA: Diagnosis present

## 2022-08-14 DIAGNOSIS — E785 Hyperlipidemia, unspecified: Secondary | ICD-10-CM | POA: Diagnosis present

## 2022-08-14 DIAGNOSIS — R1312 Dysphagia, oropharyngeal phase: Secondary | ICD-10-CM | POA: Diagnosis present

## 2022-08-14 DIAGNOSIS — I959 Hypotension, unspecified: Secondary | ICD-10-CM | POA: Diagnosis present

## 2022-08-14 DIAGNOSIS — R945 Abnormal results of liver function studies: Secondary | ICD-10-CM

## 2022-08-14 DIAGNOSIS — Z7902 Long term (current) use of antithrombotics/antiplatelets: Secondary | ICD-10-CM | POA: Diagnosis not present

## 2022-08-14 DIAGNOSIS — I10 Essential (primary) hypertension: Secondary | ICD-10-CM | POA: Diagnosis present

## 2022-08-14 DIAGNOSIS — R1033 Periumbilical pain: Principal | ICD-10-CM

## 2022-08-14 DIAGNOSIS — Z955 Presence of coronary angioplasty implant and graft: Secondary | ICD-10-CM | POA: Diagnosis not present

## 2022-08-14 DIAGNOSIS — Z79899 Other long term (current) drug therapy: Secondary | ICD-10-CM | POA: Diagnosis not present

## 2022-08-14 DIAGNOSIS — R634 Abnormal weight loss: Secondary | ICD-10-CM

## 2022-08-14 DIAGNOSIS — Z87891 Personal history of nicotine dependence: Secondary | ICD-10-CM

## 2022-08-14 DIAGNOSIS — I7143 Infrarenal abdominal aortic aneurysm, without rupture: Secondary | ICD-10-CM | POA: Diagnosis present

## 2022-08-14 DIAGNOSIS — R9431 Abnormal electrocardiogram [ECG] [EKG]: Secondary | ICD-10-CM

## 2022-08-14 DIAGNOSIS — Z800 Family history of malignant neoplasm of digestive organs: Secondary | ICD-10-CM | POA: Diagnosis not present

## 2022-08-14 DIAGNOSIS — I739 Peripheral vascular disease, unspecified: Secondary | ICD-10-CM | POA: Diagnosis present

## 2022-08-14 LAB — ECHOCARDIOGRAM COMPLETE
Area-P 1/2: 4.33 cm2
Calc EF: 54.2 %
S' Lateral: 3.6 cm
Single Plane A2C EF: 54.8 %
Single Plane A4C EF: 51.9 %

## 2022-08-14 MED ORDER — SIMETHICONE 80 MG PO CHEW
80.0000 mg | CHEWABLE_TABLET | Freq: Four times a day (QID) | ORAL | Status: DC | PRN
  Administered 2022-08-14: 80 mg via ORAL
  Filled 2022-08-14: qty 1

## 2022-08-14 MED ORDER — CEFADROXIL 500 MG PO CAPS
500.0000 mg | ORAL_CAPSULE | Freq: Two times a day (BID) | ORAL | Status: DC
  Administered 2022-08-14 (×2): 500 mg via ORAL
  Filled 2022-08-14 (×3): qty 1

## 2022-08-14 MED ORDER — METRONIDAZOLE 500 MG PO TABS
500.0000 mg | ORAL_TABLET | Freq: Three times a day (TID) | ORAL | Status: DC
  Administered 2022-08-14 – 2022-08-15 (×3): 500 mg via ORAL
  Filled 2022-08-14 (×3): qty 1

## 2022-08-14 NOTE — Discharge Instructions (Signed)
Please watch this video for how to use your new Stiolto Respimat inhaler: <[email or web address].[employer].[email or web address]/products/stiolto/how-to-use>

## 2022-08-14 NOTE — Progress Notes (Signed)
  Transition of Care [facility]) Screening Note   Patient Details  Name: Donald Mckinney Date of Birth: April 27, 1946   [facility] of Care [facility]) CM/SW Contact:    Cyndi Bender, RN Phone Number: 08/14/2022, 8:24 AM    Transition of Care Department [facility]) has reviewed patient and no TOC needs have been identified at this time. We will continue to monitor patient advancement through interdisciplinary progression rounds. If new patient transition needs arise, please place a TOC consult.

## 2022-08-14 NOTE — TOC Benefit Eligibility Note (Addendum)
Patient Teacher, English as a foreign language completed.    The patient is currently admitted and upon discharge could be taking Symbicort 80-4.5 mcg/act inhaler.  The current 30 day co-pay is $40.00.   The patient is currently admitted and upon discharge could be taking Stiolto Respimat 2.5-2.5 mcg/Act.  The current 30 day co-pay is $40.00.   The patient is insured through [place], [employer] Patient Advocate Specialist [place] Patient Advocate Team Direct Number: [phone]  Fax: [phone]

## 2022-08-14 NOTE — Progress Notes (Signed)
Echocardiogram 2D Echocardiogram has been performed.  Donald Mckinney 08/14/2022, 10:34 AM

## 2022-08-14 NOTE — Evaluation (Signed)
Clinical/Bedside Swallow Evaluation Patient Details  Name: Donald Mckinney MRN: [id number] Date of Birth: [birth year]  Today's Date: 08/14/2022 Time: SLP Start Time (ACUTE ONLY): 2536 SLP Stop Time (ACUTE ONLY): 6440 SLP Time Calculation (min) (ACUTE ONLY): 30 min  Past Medical History:  Past Medical History:  Diagnosis Date   AAA (abdominal aortic aneurysm) ([place])    Cancer ([place]) 2005   [place]   Cancer of skin, face Dec. 2013  and  Jan.  2014   [facility]  Left cheek and nose;  2016  Left Periorbital   Coronary artery disease    Hyperlipidemia    Hypertension    Leg pain    with walking   MI, old    INFERIOR WALL M.I.   Myocardial infarction ([place]) 10/24/2008   PVD (peripheral vascular disease) ([place])    MODERATE TO SEVERE, INVOLVING THE DISTAL AORTA AND BILATERAL ILIACS   Past Surgical History:  Past Surgical History:  Procedure Laterality Date   ABDOMINAL AORTOGRAM N/A 01/17/2022   Procedure: ABDOMINAL AORTOGRAM;  Surgeon: Serafina Mitchell, MD;  Location: [place] [facility];  Service: Cardiovascular;  Laterality: N/A;   ABDOMINAL AORTOGRAM W/LOWER EXTREMITY N/A 10/30/2017   Procedure: ABDOMINAL AORTOGRAM W/LOWER EXTREMITY;  Surgeon: Waynetta Sandy, MD;  Location: [place] [facility];  Service: Cardiovascular;  Laterality: N/A;   CARDIAC CATHETERIZATION     WELL PRESERVED LEFT VENTRICULAR SYSTOLIC FUNCTION. EF 55%   CORONARY ANGIOPLASTY WITH STENT PLACEMENT     RIGHT CORONARY ARTERY, LEFT ANTERIOR DESCENDING ARTERY   CORONARY ARTERY BYPASS GRAFT     EMBOLIZATION  01/17/2022   Procedure: EMBOLIZATION;  Surgeon: Serafina Mitchell, MD;  Location: [place] [facility];  Service: Cardiovascular;;   iliac artery stenting Bilateral Jan. 7, 2010   Bilateral CIA and Right External IA  stents    PELVIC ANGIOGRAPHY  01/17/2022   Procedure: PELVIC ANGIOGRAPHY;  Surgeon: Serafina Mitchell, MD;  Location: [place] [facility];  Service: Cardiovascular;;   PERIPHERAL VASCULAR INTERVENTION Left  01/17/2022   Procedure: PERIPHERAL VASCULAR INTERVENTION;  Surgeon: Serafina Mitchell, MD;  Location: [place] [facility];  Service: Cardiovascular;  Laterality: Left;  COMMON AND INT ILIAC   PRE-MALIGNANT / BENIGN SKIN LESION EXCISION Left Oct. 27, 2015   Posterior Neck- Benign mole   HPI:  76yo male admitted 08/13/22 with abdominal pain and SOB.  PMH: Several month history of SOB, dysphagia, dysarthria, and weight loss. CAD, aortic aneurysm, HTN, smoking, PVD.    Assessment / Plan / Recommendation  Clinical Impression  Pt seen at bedside for assessment of swallow function and safety. Pt was seen by SLP at [facility] in March 2023. No overt s/s aspiration observed during that assessment. Today, pt presents with adequate dentition. CN exam unremarkable, however, pt exhibits mild dysarthria. No overt s/s aspiration observed given thin liquids, puree, or solid textures. Pt was noted to take very small bites of graham cracker and chew thoroughly. Belching was noted following PO trials, raising suspicion for esophageal issues. At this time, recommend a regular diet with thin liquids, as pt is aware of what he can and cannot manage. Recommend both a MBS and regular barium swallow (esophagram) to evaluate oropharyngeal and esophageal stages of the swallow. Pt's dysarthria could be causing post-swallow residue or inadequate airway closure, and esophageal dysmotility could increase aspiration risk if stasis backs up to the airway. MD and RN aware of recommendations. SLP will continue to follow.  SLP Visit Diagnosis: Dysphagia,  unspecified (R13.10)    Aspiration Risk  Mild aspiration risk;Moderate aspiration risk;Risk for inadequate nutrition/hydration    Diet Recommendation Regular;Thin liquid   Liquid Administration via: Cup;Straw Medication Administration: Other (Comment) (as tolerated) Supervision: Patient able to self feed;Intermittent supervision to cue for compensatory strategies Compensations:  Minimize environmental distractions;Slow rate;Small sips/bites Postural Changes: Seated upright at 90 degrees;Remain upright for at least 30 minutes after po intake    Other  Recommendations Recommended Consults: Consider esophageal assessment Oral Care Recommendations: Oral care BID    Recommendations for follow up therapy are one component of a multi-disciplinary discharge planning process, led by the attending physician.  Recommendations may be updated based on patient status, additional functional criteria and insurance authorization.  Follow up Recommendations Follow physician's recommendations for discharge plan and follow up therapies      Assistance Recommended at Discharge None  Functional Status Assessment Patient has not had a recent decline in their functional status  Frequency and Duration min 1 x/week  1 week       Prognosis  good     Swallow Study   General Date of Onset: 08/13/22 HPI: 76yo male admitted 08/13/22 with abdominal pain and SOB.  PMH: Several month history of SOB, dysphagia, dysarthria, and weight loss. CAD, aortic aneurysm, HTN, smoking, PVD. Type of Study: Bedside Swallow Evaluation Previous Swallow Assessment: SLP eval 02/06/22 - 29/30 SLUMS, no overt s/s aspiration with YSS. Diet Prior to this Study: Dysphagia 1 (puree);Thin liquids Temperature Spikes Noted: No Respiratory Status: Room air History of Recent Intubation: No Behavior/Cognition: Alert;Cooperative;Pleasant mood Oral Cavity Assessment: Within Functional Limits Oral Care Completed by SLP: No Oral Cavity - Dentition: Adequate natural dentition Vision: Functional for self-feeding Self-Feeding Abilities: Able to feed self Patient Positioning: Upright in bed Baseline Vocal Quality: Normal Volitional Cough: Weak Volitional Swallow: Able to elicit    Oral/Motor/Sensory Function Overall Oral Motor/Sensory Function: Within functional limits (however, speech is mildly dysarthric)   Ice Chips  Ice chips: Not tested Other Comments: pt dislikes extreme temperatures   Thin Liquid Thin Liquid: Within functional limits Presentation: Cup;Self Fed;Straw    Nectar Thick Nectar Thick Liquid: Not tested   Honey Thick Honey Thick Liquid: Not tested   Puree Puree: Within functional limits Presentation: Spoon   Solid     Solid: Within functional limits Presentation: [place] B. Quentin Ore, [facility], [place] Speech Language Pathologist Office: [phone]  Shonna Chock 08/14/2022,4:05 PM

## 2022-08-14 NOTE — Progress Notes (Signed)
Subjective:  Patient reports sleeping well last night without any overnight events. He has been saturating 90-97% on 2-3L O2 and has not required any PRN medications. His wife was present throughout encounter with the IM treatment team this morning. Plans for speech evaluation and barium swallow study were explained, questions answered.   Objective:  Vitals over previous 24hr: Vitals:   08/13/22 2140 08/14/22 0414 08/14/22 0419 08/14/22 0600  BP: 111/71 (!) 84/49 (!) 93/57 (!) 88/56  Pulse: 78 70 66 63  Resp: (!) 22 (!) 22 (!) 22 (!) 24  Temp: 98 F (36.7 C)  97.9 F (36.6 C)   TempSrc: Oral  Oral Oral  SpO2: 94%  92% 96%    General:      awake and alert, lying comfortably in bed, cooperative, not in acute distress Head:      normocephalic and atraumatic, oral mucosa moist with good dentition, no lymphadenopathy Lungs:      normal respiratory effort, breathing unlabored, symmetrical chest rise, no crackles or wheezing Cardiac:      regular rate and rhythm, normal S1 and S2 Neurologic:      oriented to person-place-time, no gross focal deficits Psychiatric:      mood and affect normal, intelligible speech    Assessment/Plan: Mr Waggle is a 76 year old male with a past medical history of coronary angioplasty with stenting, skin cancer, iliac artery stenting, peripheral vascular disease, hypertension, infrarenal abdominal aortic aneurysm, and iliac artery aneurysm who presented with abdominal pain and shortness of breath, now hospitalized for further workup of acute hypoxic respiratory failure.   ---Acute hypoxic respiratory failure Patient reports a seven-month history of breath shortness that is worse while lying flat. This symptom has been accompanied by a sensation of food getting stuck in the back of his throat. He has been evaluated by neurology and speech pathology in the past, neither specialist could offer a clear explanation for these symptoms. Upon arrival to the  ED, oxygen saturations on room air were in the high 80s but he has been saturating well on 2-3L since admission. His lungs were clear on exam today 9-25 and wheezing was absent. Etiology of hypoxia likely either an aspiration event or mucus plug. He is receiving antibiotics for possible aspiration pneumonia given elevated WBCs. Underlying COPD, or emphysema given extensive smoking history, could potentially be contributing to his symptoms as well, though he does not carry an official diagnosis of either disease. Echocardiogram on 9-25 demonstrated normal LV and RV function with EF 55-60%. Respiratory panel was negative. > Metronidazole '500mg'$  q8 for 4d > Cefadroxil '500mg'$  q12 for 2d > Ipratropium-albuterol 0.5-2.'5mg'$  q4 PRN > Umeclidinium-vilanterol 62.5-'25mg'$  q24 > Otolaryngology evaluation in outpatient setting   ---Dysphagia ---Dysarthria ---Weight loss Patient reports a seven-month history of dysarthria and dysphagia. Head CT and brain MRI performed 12-2021 both negative for any acute intracranial processes. He has lost about 35lbs in the last year, which he attributes to his dysphagia. > Speech evaluation with barium swallow study > Diet dysphagia I > Consider MRI if barium swallow study unrevealing   ---Elevated liver enzymes ---Hyperbilirubinemia Liver enzymes have been down-trending since admission but are still elevated with AST 224, ALT 205. Total bilirubin, also initially elevated, has since normalized. Etiology unclear, though possibly secondary to current illness. Hepatitis panel including A, B, and C was negative. Acetaminophen level <10. > Trend CMP   ---Coronary artery disease post myocardial infarction ---Peripheral artery disease Patient experienced an MI in 2009. He currently takes clopidogrel  and rosuvastatin at home. Liver enzymes have been elevated throughout admission, but can resume rosuvastatin if improved on 9-26. > Clopidogrel '75mg'$  q24 > Hold rosuvastatin '20mg'$  q24  given elevated AST-ALT   ---Hypertension Managed at home with carvedilol and lisinopril. His blood pressure throughout admission has been low, likely secondary to poor oral intake. > Hold carvedilol 6.'25mg'$  q24 and lisinopril '5mg'$  q24 given low BPs     Principal Problem:   Acute respiratory failure with hypoxia [facility])   Prior to Admission Living Arrangement: Anticipated Discharge Location: Barriers to Discharge: Dispo: Anticipated discharge in approximately 2 day(s).   Roswell Nickel, MD Internal Medicine PGY-1 Pager [phone]  After 5pm on weekdays and 1pm on weekends: On Call pager [phone]

## 2022-08-14 NOTE — Telephone Encounter (Signed)
Pharmacy Patient Advocate Encounter  Insurance verification completed.    The patient is insured through [facility] [employer] Part D   The patient is currently admitted and ran test claims for the following: Symbicort.  Copays and coinsurance results were relayed to Inpatient clinical team.

## 2022-08-15 ENCOUNTER — Inpatient Hospital Stay (HOSPITAL_COMMUNITY): Payer: Medicare PPO

## 2022-08-15 DIAGNOSIS — J9601 Acute respiratory failure with hypoxia: Secondary | ICD-10-CM

## 2022-08-15 DIAGNOSIS — R471 Dysarthria and anarthria: Secondary | ICD-10-CM

## 2022-08-15 DIAGNOSIS — R1312 Dysphagia, oropharyngeal phase: Secondary | ICD-10-CM

## 2022-08-15 DIAGNOSIS — R7401 Elevation of levels of liver transaminase levels: Secondary | ICD-10-CM | POA: Diagnosis not present

## 2022-08-15 LAB — CBC
HCT: 37 % — ABNORMAL LOW (ref 39.0–52.0)
Hemoglobin: 11.8 g/dL — ABNORMAL LOW (ref 13.0–17.0)
MCH: 30.6 pg (ref 26.0–34.0)
MCHC: 31.9 g/dL (ref 30.0–36.0)
MCV: 96.1 fL (ref 80.0–100.0)
Platelets: 195 10*3/uL (ref 150–400)
RBC: 3.85 MIL/uL — ABNORMAL LOW (ref 4.22–5.81)
RDW: 14.1 % (ref 11.5–15.5)
WBC: 12.3 10*3/uL — ABNORMAL HIGH (ref 4.0–10.5)
nRBC: 0 % (ref 0.0–0.2)

## 2022-08-15 LAB — COMPREHENSIVE METABOLIC PANEL
ALT: 196 U/L — ABNORMAL HIGH (ref 0–44)
AST: 144 U/L — ABNORMAL HIGH (ref 15–41)
Albumin: 3.2 g/dL — ABNORMAL LOW (ref 3.5–5.0)
Alkaline Phosphatase: 73 U/L (ref 38–126)
Anion gap: 7 (ref 5–15)
BUN: 8 mg/dL (ref 8–23)
CO2: 34 mmol/L — ABNORMAL HIGH (ref 22–32)
Calcium: 9 mg/dL (ref 8.9–10.3)
Chloride: 96 mmol/L — ABNORMAL LOW (ref 98–111)
Creatinine, Ser: 0.54 mg/dL — ABNORMAL LOW (ref 0.61–1.24)
GFR, Estimated: >60 mL/min (ref >60)
Glucose, Bld: 101 mg/dL — ABNORMAL HIGH (ref 70–99)
Potassium: 3.7 mmol/L (ref 3.5–5.1)
Sodium: 137 mmol/L (ref 135–145)
Total Bilirubin: 0.7 mg/dL (ref 0.3–1.2)
Total Protein: 5.9 g/dL — ABNORMAL LOW (ref 6.5–8.1)

## 2022-08-15 LAB — GLUCOSE, CAPILLARY: Glucose-Capillary: 89 mg/dL (ref 70–99)

## 2022-08-15 MED ORDER — CEFADROXIL 500 MG PO CAPS
500.0000 mg | ORAL_CAPSULE | Freq: Two times a day (BID) | ORAL | Status: DC
  Administered 2022-08-15 – 2022-08-16 (×3): 500 mg via ORAL
  Filled 2022-08-15 (×4): qty 1

## 2022-08-15 MED ORDER — DIPHENHYDRAMINE HCL 25 MG PO CAPS
25.0000 mg | ORAL_CAPSULE | Freq: Once | ORAL | Status: AC
  Administered 2022-08-15: 25 mg via ORAL
  Filled 2022-08-15: qty 1

## 2022-08-15 MED ORDER — METRONIDAZOLE 500 MG PO TABS
500.0000 mg | ORAL_TABLET | Freq: Two times a day (BID) | ORAL | Status: DC
  Administered 2022-08-15 – 2022-08-16 (×2): 500 mg via ORAL
  Filled 2022-08-15 (×2): qty 1

## 2022-08-15 MED ORDER — CILOSTAZOL 100 MG PO TABS
100.0000 mg | ORAL_TABLET | Freq: Two times a day (BID) | ORAL | Status: DC
  Administered 2022-08-15 – 2022-08-16 (×2): 100 mg via ORAL
  Filled 2022-08-15 (×6): qty 1

## 2022-08-15 NOTE — Progress Notes (Signed)
SATURATION QUALIFICATIONS: (This note is used to comply with regulatory documentation for home oxygen)  Patient Saturations on Room Air at Rest = 96%  Patient Saturations on Room Air while Ambulating = 90%  Patient Saturations on 0 Liters of oxygen while Ambulating = 90%  Please briefly explain why patient needs home oxygen: Patient's oxygenation fluctuated from 90% to 94% on room air while ambulating; ambulated for approx. 6 minutes. Patient did take several stop breaks and did endorse shortness of breath when upon returning to the patient's room however no overt symptoms of acute distress. Patient denied lightheaded or chest pain. Per shift report patient, patient had a desaturation overnight where O2 was in the low 80s on room air while sleeping; per report shift report, he required O2 to recover to saturations > 92%.

## 2022-08-15 NOTE — Progress Notes (Signed)
Internal Medicine Attending:   I saw and examined the patient. I reviewed Dr Jacelyn Grip  note and I agree with the resident's findings and plan as documented in the resident's note.  Overall Donald Mckinney reports he is doing well today he is not currently having any supplemental oxygen.  His wife however reports that he did have a hypoxic event overnight while he was sleeping where his O2 saturation numbers went down into the 80s she got the nurse and he awoke they placed a nasal cannula on him and his oxygen numbers improved this scared her a bit.  I discussed with the patient wife and daughter-in-law that symptoms mild hypoxia while sleeping can come from apneic events however I still remain concerned given his presenting hypoxia.  Overall given his weight loss and severe oropharyngeal symptoms I think that his swallowing ability is at the forefront of my concerns.  I am glad to hear that he has an upcoming ENT evaluation.  We did obtain a modified barium swallow and swallow evaluation here however the results only showed a mild oropharyngeal dysphagia that is cervical in nature there is a possible cervical osteophyte that may be narrowing the cervical esophagus.  To me this does not adequately explain the degree of his dysphagia.

## 2022-08-15 NOTE — Progress Notes (Signed)
Modified Barium Swallow Progress Note  Patient Details  Name: Donald Mckinney MRN: [id number] Date of Birth: [birth year]  Today's Date: 08/15/2022  Modified Barium Swallow completed.  Full report located under Chart Review in the Imaging Section.  Brief recommendations include the following:  Clinical Impression  Patient presents with a mild oropharyngeal dysphagia and suspected cervical esophageal phase dysphagia. During oral phase, patient exhibits prolonged mastication and transit of bolus from anterior to posterior portion of oral cavity as well as prolonged transit of liquids. SLP suspects a contributing factor is patient's oral mucosa does not appear to have adequate moisture. During pharyngeal phase of swallow, patient exhibited swallow initiation delays to the level of vallecular sinus and trace to mild vallecular residuals with liquids and solids and trace pyriform sinus residuals s/p initial swallow, which cleared with subsequent swallows. He exhibited instances of silent penetration with thin liquids which occured during the swallow and did lead to one instance of aspiration just below the vocal cords. Patient was able to clear penetrate and aspirate with cued cough and reswallow. He was not able to orally transit barium tablet with thin liquids but he was able to (with delay) with puree solids. Esophageal sweep did not reveal any delays in transit or stasis of barium. SLP observed suspected cervical osteophyte (no radiologist present to confirm) which did appear to cause narrowing of the cervical esophagus. SLP recommending patient continue on current diet as he is able to select foods that he knows he can tolerate. In addition, recommendation is for pills whole in puree, clear throat and reswallow after sips of liquids. SLP will follow patient briefly for toleration.   Swallow Evaluation Recommendations       SLP Diet Recommendations: Regular solids;Thin liquid   Liquid Administration  via: Cup;No straw   Medication Administration: Whole meds with puree   Supervision: Patient able to self feed   Compensations: Minimize environmental distractions;Slow rate;Small sips/bites   Postural Changes: Seated upright at 90 degrees   Oral Care Recommendations: Oral care BID       Sonia Baller, MA, CCC-SLP Speech Therapy

## 2022-08-15 NOTE — Progress Notes (Signed)
Patient and spouse have concerns regarding MRI. States that he had contrast reactions in the past so has required prednisone and benadryl. I informed the family that benadryl is ordered and should be sufficient; family insisted that I consult with provider and request that he also have prednisone. Paged patient's team and spoke with Dr. Zack Seal; acknowledges RE: iodinated contrast. MD will go to visit patient at bedside to discuss further.

## 2022-08-15 NOTE — Progress Notes (Signed)
Subjective:  Patient reports feeling good overnight without any acute events. He continues to expectorate copious amounts of mucus. Family noticed that he desaturated while sleeping last night. Oxygen was administered to good effect. Patient and family are wondering about next steps. Wife would like an earlier ENT appointment than what is currently scheduled two weeks from now. Questions regarding the patient's condition and hospital course were answered.    Objective:  Vitals over previous 24hr: Vitals:   08/15/22 0000 08/15/22 0500 08/15/22 0742 08/15/22 0800  BP: 110/68  (!) 106/52 135/64  Pulse: 64  65 67  Resp: (!) '26  18 17  '$ Temp: 97.8 F (36.6 C)   98.8 F (37.1 C)  TempSrc: Oral   Oral  SpO2: 97%  97% 98%  Weight:  68 kg      General:      awake and alert, sitting comfortably in bed, cooperative, not in acute distress Ears:      tympanic membranes clear bilaterally without erythema, bubbles, or discharge  Head:      normocephalic and atraumatic, oral mucosa moist with good dentition, no lymphadenopathy Lungs:      normal respiratory effort, breathing unlabored, symmetrical chest rise, no crackles or wheezing Cardiac:      regular rate and rhythm, normal S1 and S2 Neurologic:      oriented to person-place-time, no gross focal deficits Psychiatric:      mood and affect normal, intelligible speech    Assessment/Plan: Mr Dolce is a 76 year old male with a past medical history of coronary angioplasty with stenting, skin cancer, iliac artery stenting, peripheral vascular disease, hypertension, infrarenal abdominal aortic aneurysm, and iliac artery aneurysm who presented with abdominal pain and shortness of breath, now hospitalized for further workup of acute hypoxic respiratory failure.   ---Acute hypoxic respiratory failure Patient reports a seven-month history of breath shortness that is worse while lying flat accompanied by a sensation of food getting stuck in the  back of his throat. He has been evaluated by neurology and speech pathology in the past, neither specialist could offer a clear explanation for these symptoms. Upon arrival, oxygen saturations on room air were in the high 80s. He has been saturating well since then and is now without supplemental oxygen needs. Etiology likely either an aspiration event or mucus plug, though vocal cord spasm represents another possibility. He is receiving antibiotics for possible aspiration pneumonia given elevated WBCs. Echocardiogram on 9-25 demonstrated normal LV and RV function with EF 55-60%. > Metronidazole '500mg'$  q8 for 4d > Cefadroxil '500mg'$  q12 for 2d > Ipratropium-albuterol 0.5-2.'5mg'$  q4 PRN > Umeclidinium-vilanterol 62.5-'25mg'$  q24 > Evaluate oxygen needs while ambulating > Consider sleep study as outpatient   ---Oropharyngeal dysphagia ---Dysarthria Patient reports a seven-month history of dysarthria and dysphagia. Head CT and brain MRI performed 12-2021 both negative for any acute intracranial processes. He has lost about 35lbs in the last year, which he attributes to his dysphagia. Speech-language pathology cleared patient for regular thin liquid diet and barium swallow study revealed oropharyngeal phase dysphagia. > Diet regular with thin liquids > Follow up with otolaryngologist Dr Sabino Gasser at [facility] on 10-11 > Follow up with audiology at [facility] on 10-11   ---Elevated liver enzymes ---Hyperbilirubinemia Liver enzymes have been down-trending since admission but are still elevated with AST 144 and ALT 196. Total bilirubin, also initially elevated, has since normalized. Etiology unclear, though possibly secondary to current illness. > Trend CMP   ---Coronary artery disease post myocardial infarction ---  Peripheral artery disease Patient experienced an MI in 2009. He currently takes clopidogrel and rosuvastatin at home. Liver enzymes have been elevated throughout admission, but can resume  rosuvastatin if improved on 9-26. > Clopidogrel '75mg'$  q24 > Cilostazol '100mg'$  q12 > Hold rosuvastatin '20mg'$  q24 given elevated AST-ALT   ---Hypertension Managed at home with carvedilol and lisinopril. His blood pressure throughout admission has been low, likely secondary to poor oral intake. > Hold carvedilol 6.'25mg'$  q24 and lisinopril '5mg'$  q24 given low BPs     Principal Problem:   Acute respiratory failure with hypoxia (HCC) Active Problems:   Periumbilical abdominal pain   Shortness of breath   Dysarthria   Prior to Admission Living Arrangement: Anticipated Discharge Location: Barriers to Discharge: Dispo: Anticipated discharge in approximately 2 day(s).   Roswell Nickel, MD Internal Medicine PGY-1 Pager [phone]  After 5pm on weekdays and 1pm on weekends: On Call pager [phone]

## 2022-08-16 DIAGNOSIS — R1312 Dysphagia, oropharyngeal phase: Secondary | ICD-10-CM | POA: Diagnosis not present

## 2022-08-16 DIAGNOSIS — J9601 Acute respiratory failure with hypoxia: Secondary | ICD-10-CM | POA: Diagnosis not present

## 2022-08-16 DIAGNOSIS — J690 Pneumonitis due to inhalation of food and vomit: Secondary | ICD-10-CM

## 2022-08-16 DIAGNOSIS — R471 Dysarthria and anarthria: Secondary | ICD-10-CM | POA: Diagnosis not present

## 2022-08-16 LAB — COMPREHENSIVE METABOLIC PANEL
ALT: 132 U/L — ABNORMAL HIGH (ref 0–44)
AST: 67 U/L — ABNORMAL HIGH (ref 15–41)
Albumin: 3.3 g/dL — ABNORMAL LOW (ref 3.5–5.0)
Alkaline Phosphatase: 92 U/L (ref 38–126)
Anion gap: 12 (ref 5–15)
BUN: 5 mg/dL — ABNORMAL LOW (ref 8–23)
CO2: 27 mmol/L (ref 22–32)
Calcium: 8.8 mg/dL — ABNORMAL LOW (ref 8.9–10.3)
Chloride: 98 mmol/L (ref 98–111)
Creatinine, Ser: 0.59 mg/dL — ABNORMAL LOW (ref 0.61–1.24)
GFR, Estimated: >60 mL/min (ref >60)
Glucose, Bld: 86 mg/dL (ref 70–99)
Potassium: 3.3 mmol/L — ABNORMAL LOW (ref 3.5–5.1)
Sodium: 137 mmol/L (ref 135–145)
Total Bilirubin: 1 mg/dL (ref 0.3–1.2)
Total Protein: 6.1 g/dL — ABNORMAL LOW (ref 6.5–8.1)

## 2022-08-16 LAB — CBC
HCT: 37.7 % — ABNORMAL LOW (ref 39.0–52.0)
Hemoglobin: 12.2 g/dL — ABNORMAL LOW (ref 13.0–17.0)
MCH: 30 pg (ref 26.0–34.0)
MCHC: 32.4 g/dL (ref 30.0–36.0)
MCV: 92.9 fL (ref 80.0–100.0)
Platelets: 232 10*3/uL (ref 150–400)
RBC: 4.06 MIL/uL — ABNORMAL LOW (ref 4.22–5.81)
RDW: 13.7 % (ref 11.5–15.5)
WBC: 9 10*3/uL (ref 4.0–10.5)
nRBC: 0 % (ref 0.0–0.2)

## 2022-08-16 MED ORDER — STIOLTO RESPIMAT 2.5-2.5 MCG/ACT IN AERS: 2.0000 | INHALATION_SPRAY | Freq: Every day | RESPIRATORY_TRACT | 1 refills | Status: DC

## 2022-08-16 MED ORDER — METRONIDAZOLE 500 MG PO TABS: 500.0000 mg | ORAL_TABLET | Freq: Two times a day (BID) | ORAL | 0 refills | Status: AC

## 2022-08-16 MED ORDER — CEFADROXIL 500 MG PO CAPS: 500.0000 mg | ORAL_CAPSULE | Freq: Two times a day (BID) | ORAL | 0 refills | Status: AC

## 2022-08-16 NOTE — Progress Notes (Signed)
Mobility Specialist Progress Note:   08/16/22 1235  Mobility  Activity Ambulated independently in hallway  Level of Assistance Independent  Assistive Device None  Distance Ambulated (ft) 350 ft  Activity Response Tolerated well  $Mobility charge 1 Mobility   Pt received ambulating in room and eager. No complaints. Pt left sitting EOB with all needs met and call bell in reach.   Onda Kattner Mobility Specialist-Acute Rehab Secure Chat only

## 2022-08-16 NOTE — Discharge Summary (Signed)
Name: Donald Mckinney MRN: [id number] DOB: October 31, 1946 76 y.o. PCP: Donald Sheerer, NP  Date of Admission: 08/13/2022  7:50 AM Date of Discharge: No discharge date for patient encounter. Attending Physician: Lucious Groves, DO  Discharge Diagnosis: 1. Principal Problem:   Acute respiratory failure with hypoxia (HCC) Active Problems:   Periumbilical abdominal pain   Dysarthria   Transaminitis   Oropharyngeal dysphagia   Aspiration pneumonia [facility])   Discharge Medications: Allergies as of 08/16/2022       Reactions   Ampicillin Other (See Comments)   Developed prolonged diarrhea - may have developed C. Diff colitis.  Has patient had a PCN reaction causing immediate rash, facial/tongue/throat swelling, SOB or lightheadedness with hypotension: No Has patient had a PCN reaction causing severe rash involving mucus membranes or skin necrosis: No Has patient had a PCN reaction that required hospitalization: No Has patient had a PCN reaction occurring within the last 10 years: No If all of the above answers are "NO", then may proceed with Cephalospori   Iodinated Contrast Media Itching, Rash        Medication List     STOP taking these medications    Benadryl Allergy 25 MG tablet Generic drug: diphenhydrAMINE       TAKE these medications    carvedilol 6.25 MG tablet Commonly known as: Coreg Take 1 tablet (6.25 mg total) by mouth 2 (two) times daily with a meal.   cefadroxil 500 MG capsule Commonly known as: DURICEF Take 1 capsule (500 mg total) by mouth 2 (two) times daily for 2 days.   cilostazol 100 MG tablet Commonly known as: PLETAL Take 1 tablet (100 mg total) by mouth 2 (two) times daily.   clopidogrel 75 MG tablet Commonly known as: PLAVIX Take 1 tablet by mouth once daily   Coricidin D Cold/Flu/Sinus 2-5-325 MG Tabs Generic drug: Chlorphen-Phenyleph-APAP Take 1 tablet by mouth daily as needed (Cold).   fenofibrate 145 MG tablet Commonly known as:  TRICOR Take 1 tablet (145 mg total) by mouth daily. Please keep upcoming appointment for additional refills. Thank you.   fluticasone 50 MCG/ACT nasal spray Commonly known as: FLONASE Place 1 spray into both nostrils daily.   lisinopril 5 MG tablet Commonly known as: ZESTRIL Take 1 tablet (5 mg total) by mouth daily.   loratadine 10 MG tablet Commonly known as: CLARITIN Take 10 mg by mouth daily.   meclizine 25 MG tablet Commonly known as: ANTIVERT Take 1 tablet (25 mg total) by mouth 3 (three) times daily as needed for dizziness.   metroNIDAZOLE 500 MG tablet Commonly known as: FLAGYL Take 1 tablet (500 mg total) by mouth every 12 (twelve) hours for 2 days.   multivitamin tablet Take 1 tablet by mouth daily. Centrum Mens   Nitrostat 0.4 MG SL tablet Generic drug: nitroGLYCERIN DISSOLVE ONE TABLET UNDER THE TONGUE EVERY 5 MINUTES AS NEEDED FOR CHEST PAIN.  DO NOT EXCEED A TOTAL OF 3 DOSES IN 15 MINUTES. NO RELIEF, C   rosuvastatin 20 MG tablet Commonly known as: CRESTOR Take 1 tablet by mouth once daily   Stiolto Respimat 2.5-2.5 MCG/ACT Aers Generic drug: Tiotropium Bromide-Olodaterol Inhale 2 puffs into the lungs daily.   SWIMMERS EAR DROPS OT Place 2 drops into both ears as needed (for swimmer ear).        Disposition and follow-up:   [name] was discharged from [facility] in Stable condition.  At the hospital follow up visit please  address:  Acute hypoxic respiratory failure Aspiration pneumonia vs possible COPD -Patient finished 2 days of Ceftriaxone and Flayl.  He will continue 3 more days of cefadroxil and Flagyl to complete 5-day course. -LABA/LAMA inhaler was started for possible COPD due to history of smoking. -Please obtain PFT outpatient  Oropharyngeal dysphagia Dysarthria Modified barium swallow performed inpatient reveals mild oropharyngeal dysphagia and suspected cervical esophageal phase dysphagia.  SLP suspect a  component of cervical osteophytes.  Brain MRI and cervical MRI did not show any significant causes.  There was no CVA or significant nerve impingement.  The next best step is to proceed with direct visualization of his vocal cord by ENT. -Follow-up with ENT Dr Sabino Gasser at [facility] on 08/30/2022 -Encourage p.o. intake with Ensure supplementation  Elevated liver enzymes Unclear underlying cause, likely a minor insult to the liver.  LFT have been trending down in the last several days.  Rosuvastatin and fenofibrate were held and were resumed on discharge. -Recheck CMP at follow-up  Nocturnal hypoxia Patient is wife reports 2 episodes of nocturnal hypoxia in the high 80s.  Patient may benefit from outpatient sleep study to rule out sleep apnea.  Hypertension Carvedilol and lisinopril were held on admission due to low normal blood pressure.  His blood pressure medication were resumed at discharge.  2.  Labs / imaging needed at time of follow-up: CBC, CMP, PFT  3.  Pending labs/ test needing follow-up: None   Follow-up Appointments:  -Follow up with PCP and ENT  Hospital Course by problem list: [name] is a 76 year old male with a past medical history of coronary angioplasty with stenting, skin cancer, iliac artery stenting, peripheral vascular disease, hypertension, infrarenal abdominal aortic aneurysm, and iliac artery aneurysm who presented with abdominal pain and shortness of breath, now hospitalized for further workup of acute hypoxic respiratory failure, likely aspiration pneumonia and possible COPD.  Acute hypoxic respiratory failure Patient also reports shortness of breath for the past 7 months, which gets worse with laying down.  He reports that he can't take full deep breath with nasal congestion and ear pressure. He can walk 500 ft without taking breaks.  He denies any hemoptysis, hematemesis, or NSAID use. He required 3 L nasal cannula oxygen supplementation in the emergency room. Chest  x-ray was unremarkable.  CT chest show bibasilar atelectasis or possible peribronchial airspace consolidation.  Viral respiratory panel was negative. COVID was negative. Patient was thought to have mucous plug and aspiration pneumonia from his dysphagia and possible COPD in the setting of heavy smoking history.  He was given 2-day course of ceftriaxone/Flagyl and 3 days of cefadroxil Flagyl to finish 5-day course.  LABA/LAMA inhaler was started for possible COPD.  Patient was ambulated and able to maintain O2 sat greater than 90% without oxygen supplementation.  Patient will need to have a PFT done outpatient.  Oropharyngeal dysphagia Dysarthria Patient reports a 41-monthhistory of shortness of breath, dysphagia, dysarthria, and weight loss.  He reports that this dysphagia started about 7 months ago, along with dysarthria.  Patient does inform me that he feels choked up when drinking and eating.  He attributes this to postnasal drip, which causes food to get stuck in his oropharyngeal area. CVA was ruled out in February.  Patient follow-up with a neurologist who referred him to speech therapist.  He did not receive any formal swallow test there. Modified barium swallow performed inpatient reveals mild oropharyngeal dysphagia and suspected cervical esophageal phase dysphagia.  SLP suspect a component  of cervical osteophytes.  Brain MRI and cervical MRI did not show any significant causes.  There was no CVA or significant nerve impingement.  The next best step is to proceed with direct visualization of his vocal cord by ENT.  He has a follow-up appointment on 08/30/2022.  Subjective Patient and his wife are seen at bedside.  Patient still has poor appetite from difficulty swallowing.  Patient's wife report 1 episode of nocturnal hypoxia.  They agree to follow-up with ENT for direct visualization of vocal cord.  Discharge Exam:   BP (!) 147/71 (BP Location: Right Arm)   Pulse 68   Temp 98.2 F (36.8 C)  (Oral)   Resp (!) 23   Wt 69.6 kg   SpO2 93%   BMI 24.03 kg/m   Discharge exam:  General:                       awake and alert, sitting comfortably in bed, cooperative, not in acute distress, wife bedside Head:                           normocephalic and atraumatic, oral mucosa moist with good dentition, no lymphadenopathy Lungs:                          normal respiratory effort, breathing unlabored, symmetrical chest rise, no crackles or wheezing in upper lung fields Cardiac:                        regular rate and rhythm, normal S1 and S2 Neurologic:                   oriented to person-place-time, no gross focal deficits Psychiatric:                   mood and affect normal, speech slowed with mild-moderate dysarthria but intelligible    Pertinent Labs, Studies, and Procedures:     Latest Ref Rng & Units 08/16/2022    3:55 AM 08/15/2022    3:40 AM 08/13/2022    9:36 PM  CBC  WBC 4.0 - 10.5 K/uL 9.0  12.3  21.3   Hemoglobin 13.0 - 17.0 g/dL 12.2  11.8  13.0   Hematocrit 39.0 - 52.0 % 37.7  37.0  40.0   Platelets 150 - 400 K/uL 232  195  233       Latest Ref Rng & Units 08/16/2022    3:55 AM 08/15/2022    3:40 AM 08/13/2022    9:36 PM  CMP  Glucose 70 - 99 mg/dL 86  101  181   BUN 8 - 23 mg/dL '5  8  9   '$ Creatinine 0.61 - 1.24 mg/dL 0.59  0.54  0.88   Sodium 135 - 145 mmol/L 137  137  137   Potassium 3.5 - 5.1 mmol/L 3.3  3.7  3.7   Chloride 98 - 111 mmol/L 98  96  101   CO2 22 - 32 mmol/L 27  34  25   Calcium 8.9 - 10.3 mg/dL 8.8  9.0  8.9   Total Protein 6.5 - 8.1 g/dL 6.1  5.9  6.7   Total Bilirubin 0.3 - 1.2 mg/dL 1.0  0.7  1.1   Alkaline Phos 38 - 126 U/L 92  73  50   AST 15 - 41 U/L  67  144  224   ALT 0 - 44 U/L 132  196  205    ECHOCARDIOGRAM COMPLETE  Result Date: 08/14/2022    ECHOCARDIOGRAM REPORT   Patient Name:   DOYNE MICKE Date of Exam: 08/14/2022 Medical Rec #:  [id number]       Height:       67.0 in Accession #:    [id number]      Weight:        151.0 lb Date of Birth:  1946-05-30       BSA:          1.795 m Patient Age:    42 years        BP:           98/62 mmHg Patient Gender: M               HR:           64 bpm. Exam Location:  Inpatient Procedure: 2D Echo, Cardiac Doppler and Color Doppler Indications:    R94.31 Abnormal EKG  History:        Patient has no prior history of Echocardiogram examinations.                 Previous Myocardial Infarction and CAD; Risk                 Factors:Hypertension and Dyslipidemia.  Sonographer:    Bernadene Person RDCS Referring Phys: [place]  1. Left ventricular ejection fraction, by estimation, is 55 to 60%. Left ventricular ejection fraction by PLAX is 58 %. The left ventricle has normal function. The left ventricle has no regional wall motion abnormalities. Left ventricular diastolic parameters are consistent with Grade I diastolic dysfunction (impaired relaxation).  2. Right ventricular systolic function is normal. The right ventricular size is normal. There is normal pulmonary artery systolic pressure. The estimated right ventricular systolic pressure is 02.1 mmHg.  3. The mitral valve is abnormal. Mild mitral valve regurgitation.  4. The aortic valve is tricuspid. Aortic valve regurgitation is not visualized.  5. Aortic dilatation noted. There is mild dilatation of the ascending aorta, measuring 41 mm.  6. The inferior vena cava is dilated in size with <50% respiratory variability, suggesting right atrial pressure of 15 mmHg. Comparison(s): No prior Echocardiogram. FINDINGS  Left Ventricle: Left ventricular ejection fraction, by estimation, is 55 to 60%. Left ventricular ejection fraction by PLAX is 58 %. The left ventricle has normal function. The left ventricle has no regional wall motion abnormalities. The left ventricular internal cavity size was normal in size. There is no left ventricular hypertrophy. Left ventricular diastolic parameters are consistent with Grade I diastolic  dysfunction (impaired relaxation). Indeterminate filling pressures. Right Ventricle: The right ventricular size is normal. No increase in right ventricular wall thickness. Right ventricular systolic function is normal. There is normal pulmonary artery systolic pressure. The tricuspid regurgitant velocity is 2.10 m/s, and  with an assumed right atrial pressure of 15 mmHg, the estimated right ventricular systolic pressure is 11.5 mmHg. Left Atrium: Left atrial size was normal in size. Right Atrium: Right atrial size was normal in size. Prominent Eustachian valve. Pericardium: There is no evidence of pericardial effusion. Mitral Valve: The mitral valve is abnormal. There is mild thickening of the anterior and posterior mitral valve leaflet(s). Mild mitral valve regurgitation. Tricuspid Valve: The tricuspid valve is grossly normal. Tricuspid valve regurgitation is trivial. Aortic Valve: The aortic valve is tricuspid. Aortic  valve regurgitation is not visualized. Pulmonic Valve: The pulmonic valve was normal in structure. Pulmonic valve regurgitation is not visualized. Aorta: Aortic dilatation noted. There is mild dilatation of the ascending aorta, measuring 41 mm. Venous: The inferior vena cava is dilated in size with less than 50% respiratory variability, suggesting right atrial pressure of 15 mmHg. IAS/Shunts: No atrial level shunt detected by color flow Doppler.  LEFT VENTRICLE PLAX 2D LV EF:         Left            Diastology                ventricular     LV e' medial:    7.36 cm/s                ejection        LV E/e' medial:  11.9                fraction by     LV e' lateral:   9.79 cm/s                PLAX is 58      LV E/e' lateral: 9.0                %. LVIDd:         5.20 cm LVIDs:         3.60 cm LV PW:         1.00 cm LV IVS:        0.70 cm LVOT diam:     2.20 cm LV SV:         97 LV SV Index:   54 LVOT Area:     3.80 cm  LV Volumes (MOD) LV vol d, MOD    113.0 ml A2C: LV vol d, MOD    111.0 ml A4C: LV  vol s, MOD    51.1 ml A2C: LV vol s, MOD    53.4 ml A4C: LV SV MOD A2C:   61.9 ml LV SV MOD A4C:   111.0 ml LV SV MOD BP:    62.6 ml RIGHT VENTRICLE RV Basal diam:  3.20 cm RV Mid diam:    4.20 cm RV S prime:     15.70 cm/s TAPSE (M-mode): 2.7 cm LEFT ATRIUM             Index        RIGHT ATRIUM           Index LA diam:        3.80 cm 2.12 cm/m   RA Area:     19.30 cm LA Vol (A2C):   60.0 ml 33.43 ml/m  RA Volume:   55.00 ml  30.65 ml/m LA Vol (A4C):   53.4 ml 29.76 ml/m LA Biplane Vol: 57.6 ml 32.10 ml/m  AORTIC VALVE LVOT Vmax:   114.00 cm/s LVOT Vmean:  71.000 cm/s LVOT VTI:    0.254 m  AORTA Ao Root diam: 3.50 cm Ao Asc diam:  4.10 cm MITRAL VALVE               TRICUSPID VALVE MV Area (PHT): 4.33 cm    TR Peak grad:   17.6 mmHg MV Decel Time: 175 msec    TR Vmax:        210.00 cm/s MV E velocity: 87.80 cm/s MV A velocity: 69.80 cm/s  SHUNTS MV E/A ratio:  1.26  Systemic VTI:  0.25 m                            Systemic Diam: 2.20 cm Lyman Bishop MD Electronically signed by Lyman Bishop MD Signature Date/Time: 08/14/2022/11:53:30 AM    Final    US Abdomen Limited  Result Date: 08/13/2022 CLINICAL DATA:  Abdominal pain EXAM: ULTRASOUND ABDOMEN LIMITED RIGHT UPPER QUADRANT COMPARISON:  CT today FINDINGS: Gallbladder: Gallbladder wall thickening at 4.4 mm. No visible stones or sonographic Murphy sign. Common bile duct: Diameter: Normal caliber, 5 6 mm where visualized. Distal duct obscured by overlying bowel gas Liver: No focal lesion identified. Within normal limits in parenchymal echogenicity. Portal vein is patent on color Doppler imaging with normal direction of blood flow towards the liver. Other: None. IMPRESSION: Gallbladder wall thickening. No visible gallstones or sonographic Murphy sign. Electronically Signed   By: Rolm Baptise M.D.   On: 08/13/2022 18:05   CT Soft Tissue Neck W Contrast  Result Date: 08/13/2022 CLINICAL DATA:  Difficulty swallowing. EXAM: CT NECK WITH CONTRAST  TECHNIQUE: Multidetector CT imaging of the neck was performed using the standard protocol following the bolus administration of intravenous contrast. RADIATION DOSE REDUCTION: This exam was performed according to the departmental dose-optimization program which includes automated exposure control, adjustment of the mA and/or kV according to patient size and/or use of iterative reconstruction technique. CONTRAST:  18m OMNIPAQUE IOHEXOL 350 MG/ML SOLN COMPARISON:  None Available. FINDINGS: Pharynx and larynx: No focal mucosal or submucosal lesions are present. Nasopharynx is clear. Soft palate and tongue base are within normal limits. Vallecula and epiglottis are within normal limits. Aryepiglottic folds and piriform sinuses are clear. Vocal cords are midline and symmetric. Trachea is clear. Salivary glands: The submandibular and parotid glands and ducts are within normal limits. Thyroid: Normal Lymph nodes: No significant adenopathy is present. Vascular: Atherosclerotic calcifications are present at the carotid bifurcations bilaterally without significant stenosis Limited intracranial: Within normal limits. Mastoids and visualized paranasal sinuses: The paranasal sinuses and mastoid air cells are clear. Skeleton: Vertebral body heights alignment are normal. No focal osseous lesions are present. Upper chest: The lung apices are clear. The thoracic inlet is within normal limits. IMPRESSION: 1. No acute or focal lesion to explain the patient's symptoms. 2. Atherosclerotic changes at the carotid bifurcations bilaterally without significant stenosis. Electronically Signed   By: CSan MorelleM.D.   On: 08/13/2022 16:02   CT CHEST ABDOMEN PELVIS W CONTRAST  Result Date: 08/13/2022 CLINICAL DATA:  Unintentional weight loss. EXAM: CT CHEST, ABDOMEN, AND PELVIS WITH CONTRAST TECHNIQUE: Multidetector CT imaging of the chest, abdomen and pelvis was performed following the standard protocol during bolus  administration of intravenous contrast. RADIATION DOSE REDUCTION: This exam was performed according to the departmental dose-optimization program which includes automated exposure control, adjustment of the mA and/or kV according to patient size and/or use of iterative reconstruction technique. CONTRAST:  865mOMNIPAQUE IOHEXOL 350 MG/ML SOLN COMPARISON:  September 07, 2021 FINDINGS: CT CHEST FINDINGS Cardiovascular: Mild aneurysmal dilation of the ascending aorta measures 4 cm in greatest diameter. The aortic arch measures 3 cm. The descending aorta measures 3.5 cm in greatest dimension. Tortuosity and calcific atherosclerotic disease of the aorta. Enlarged cardiac silhouette. No significant pericardial effusion. Calcific atherosclerotic disease of the coronary arteries. Mediastinum/Nodes: No enlarged mediastinal, hilar, or axillary lymph nodes. Thyroid gland, trachea, and esophagus demonstrate no significant findings. Lungs/Pleura: Bibasilar atelectasis versus peribronchial airspace consolidation. Musculoskeletal: No chest  wall mass or suspicious bone lesions identified. CT ABDOMEN PELVIS FINDINGS Hepatobiliary: Normal appearance of the liver. Question mild pericholecystic edema. No radiopaque gallbladder calculi. No evidence of intra or extrahepatic biliary ductal dilation. Pancreas: Unremarkable. No pancreatic ductal dilatation or surrounding inflammatory changes. Spleen: Normal in size without focal abnormality. Adrenals/Urinary Tract: Tiny nonobstructive renal calculi versus vascular calcifications in the upper pole of the right kidney. Normal adrenal glands. Normal ureters and urinary bladder. Stomach/Bowel: Stomach is within normal limits. No evidence of appendicitis. No evidence of bowel wall thickening, distention, or inflammatory changes. Vascular/Lymphatic: Calcific atherosclerotic disease and tortuosity of the aorta. Infrarenal abdominal aortic aneurysm measures 3.4 cm in greatest diameter, minimally  increased from 3.3 cm on September 07, 2021. There has been placement of aorto bi iliac stent graft. There is an increased eccentric nonocclusive mural thrombus within the aneurysmal aorta. Interval coiling of left iliac artery aneurysm. Reproductive: The prostate gland is moderately enlarged measuring 5.6 cm. Other: No abdominal wall hernia or abnormality. No abdominopelvic ascites. Musculoskeletal: No acute or significant osseous findings. IMPRESSION: 1. Mild aneurysmal dilation of the ascending aorta measures 4 cm in greatest diameter. Recommend annual imaging followup by CTA or MRA. This recommendation follows 2010 ACCF/AHA/AATS/ACR/ASA/SCA/SCAI/SIR/STS/SVM Guidelines for the Diagnosis and Management of Patients with Thoracic Aortic Disease. Circulation. 2010; 121: G256-L893. Aortic aneurysm NOS (ICD10-I71.9) 2. Infrarenal abdominal aortic aneurysm measures 3.4 cm in greatest diameter, minimally increased from 3.3 cm on September 07, 2021. Post placement of aorto-bi iliac stent graft with increased eccentric nonocclusive mural thrombus within the aneurysmal aorta. Interval coiling of left iliac artery aneurysm. Recommend follow-up ultrasound every 3 years. This recommendation follows ACR consensus guidelines: White Paper of the ACR Incidental Findings Committee II on Vascular Findings. J Am Coll Radiol 2013; 10:789-794. 3. Question mild pericholecystic edema. No radiopaque gallbladder calculi. Further evaluation with right upper quadrant ultrasound may be considered. 4. Tiny nonobstructive renal calculi versus vascular calcifications in the upper pole of the right kidney. 5. Moderate prostatomegaly.  Please correlate to PSA values. Aortic Atherosclerosis (ICD10-I70.0). Electronically Signed   By: Fidela Salisbury M.D.   On: 08/13/2022 15:55   DG Chest 2 View  Result Date: 08/13/2022 CLINICAL DATA:  Shortness of breath EXAM: CHEST - 2 VIEW COMPARISON:  12/03/2017 FINDINGS: The heart size and mediastinal  contours are within normal limits. Aortic atherosclerosis. No focal airspace consolidation, pleural effusion, or pneumothorax. The visualized skeletal structures are unremarkable. IMPRESSION: No active cardiopulmonary disease. Electronically Signed   By: Davina Poke D.O.   On: 08/13/2022 09:58      Discharge Instructions: Discharge Instructions     Call MD for:  difficulty breathing, headache or visual disturbances   Complete by: As directed    Call MD for:  persistant nausea and vomiting   Complete by: As directed    Call MD for:  redness, tenderness, or signs of infection (pain, swelling, redness, odor or green/yellow discharge around incision site)   Complete by: As directed    Call MD for:  severe uncontrolled pain   Complete by: As directed    Call MD for:  temperature >100.4   Complete by: As directed    Diet - low sodium heart healthy   Complete by: As directed    Discharge instructions   Complete by: As directed    Mr. Laski,  It was a pleasure taking care of you during this admission.  You were hospitalized for shortness of breath and abdominal pain.  Your shortness of breath  could be from aspiration pneumonia and possible COPD due to your history of smoking.  You completed 3 days of IV antibiotics in the hospital and will continue 2 more days of oral antibiotics at home.  We also started you on a daily inhaler to help open up your airway.  You will need to follow-up with your primary care doctor, who will order a pulmonary function test to formally diagnose COPD.  Regarding your problem swallowing, we have ruled out a stroke or cervical nerve impingement.  The next best step is to get a direct visualization of your vocal cords done by an ENT doctor.  Please follow-up with your ENT as scheduled. We called the ENT office and they don't have any earlier appointment. Please try to use Ensure or protein shakes for supplemental nutrition.  Your liver number were mildly elevated  on admission but continue to trend down every day.  This is likely due to a minor insult to the liver.  We are unclear of the cause but this likely will not have any permanent damage to the liver.  You can resume your blood pressure and cholesterol medications at discharge.  Yes please follow-up with your primary care doctor in 1-2 weeks.  Take care   Increase activity slowly   Complete by: As directed        Signed: [place] Nickel, MD 08/16/2022, 2:19 PM

## 2022-08-16 NOTE — Progress Notes (Signed)
Discharge instructions reviewed with pt and his wife.  Copy of instructions given to pt. Informed scripts sent to his pharmacy for pick up. Pt and wife verbalized understanding of all instructions.  Pt d/c'd via wheelchair with belongings, with wife.           Escorted by staff.

## 2022-08-18 ENCOUNTER — Telehealth: Payer: Self-pay | Admitting: Cardiovascular Disease

## 2022-08-18 NOTE — Telephone Encounter (Signed)
Returned call to wife Zigmund Daniel to discuss fenofibrate dosing. Advised that the drug does not have an available strength that's equivalent to splitting the dose. The dose can be altered per MD approval, but she states that he is scheduled to see an ENT physician next month because he is having such issues with swallowing. She also states that he last lost 35 lbs, changed his diet, and labs done on 07/05/22 showed LDL of 40 and lew triglycerides (viewable in epic under scanned documents). I advised her that he was on these medications when these labs were done, so cannot fully say what his numbers would be without them. She states that he is still on crestor, but they are opting to hold this medication until they have labs done at the PCP to recheck lipids and show that he needs this. Asked her to please reach out to [place] and let us know when last labs were done so that we can review, and again, reminded her that we can seek a lower strength, but again couldn't verify what the tablet size would be.

## 2022-08-18 NOTE — Telephone Encounter (Signed)
Pt c/o medication issue:  1. Name of Medication: fenofibrate (TRICOR) 145 MG tablet  2. How are you currently taking this medication (dosage and times per day)? 1 tablet daily  3. Are you having a reaction (difficulty breathing--STAT)? no  4. What is your medication issue? Patient's wife states the patient cannot swallow the tablet, because it is so big. She would like to know if he can have 3 smaller tablets called in to be easier to swallow.

## 2022-08-21 DIAGNOSIS — I513 Intracardiac thrombosis, not elsewhere classified: Secondary | ICD-10-CM | POA: Diagnosis not present

## 2022-08-21 DIAGNOSIS — N4289 Other specified disorders of prostate: Secondary | ICD-10-CM | POA: Diagnosis not present

## 2022-08-21 DIAGNOSIS — R748 Abnormal levels of other serum enzymes: Secondary | ICD-10-CM | POA: Diagnosis not present

## 2022-08-21 DIAGNOSIS — R1312 Dysphagia, oropharyngeal phase: Secondary | ICD-10-CM | POA: Diagnosis not present

## 2022-08-21 DIAGNOSIS — R634 Abnormal weight loss: Secondary | ICD-10-CM | POA: Diagnosis not present

## 2022-08-21 DIAGNOSIS — R1310 Dysphagia, unspecified: Secondary | ICD-10-CM | POA: Diagnosis not present

## 2022-08-21 DIAGNOSIS — E209 Hypoparathyroidism, unspecified: Secondary | ICD-10-CM | POA: Diagnosis not present

## 2022-08-21 DIAGNOSIS — E440 Moderate protein-calorie malnutrition: Secondary | ICD-10-CM | POA: Diagnosis not present

## 2022-08-21 DIAGNOSIS — J9601 Acute respiratory failure with hypoxia: Secondary | ICD-10-CM | POA: Diagnosis not present

## 2022-08-22 ENCOUNTER — Telehealth: Payer: Self-pay | Admitting: Cardiovascular Disease

## 2022-08-22 NOTE — Telephone Encounter (Signed)
Would like a callback from nurse regarding pt's visit yesterday in their office. Please advise

## 2022-08-23 NOTE — Telephone Encounter (Signed)
Called patient's PCP, Pablo Lawrence, NP. She wanted to bring Dr. Acie Fredrickson in on what is happening with the patient. She asked that Dr. 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 Patient's health has declined, patient had a elevated D-dimer, SOB (O2 85% in office) started on oxygen, patient having hard time swallowing, decrease in weight (down to 140 lbs). Loma Sousa would like to collaborate with Dr. Acie Fredrickson on patient's care and make sure patient does not need any additional testing, office visit with [place], etc. Loma Sousa said she can be reach at [phone], she is off tomorrow, but back in office on Friday, but Dr. Acie Fredrickson can call either day.

## 2022-08-25 DIAGNOSIS — E209 Hypoparathyroidism, unspecified: Secondary | ICD-10-CM | POA: Diagnosis not present

## 2022-08-25 DIAGNOSIS — J9601 Acute respiratory failure with hypoxia: Secondary | ICD-10-CM | POA: Diagnosis not present

## 2022-08-25 DIAGNOSIS — E440 Moderate protein-calorie malnutrition: Secondary | ICD-10-CM | POA: Diagnosis not present

## 2022-08-29 ENCOUNTER — Other Ambulatory Visit (HOSPITAL_COMMUNITY): Payer: Self-pay | Admitting: Family Medicine

## 2022-08-29 ENCOUNTER — Other Ambulatory Visit: Payer: Self-pay | Admitting: Family Medicine

## 2022-08-29 DIAGNOSIS — E209 Hypoparathyroidism, unspecified: Secondary | ICD-10-CM

## 2022-08-30 ENCOUNTER — Other Ambulatory Visit (HOSPITAL_COMMUNITY): Payer: Self-pay | Admitting: Otolaryngology

## 2022-08-30 DIAGNOSIS — R1312 Dysphagia, oropharyngeal phase: Secondary | ICD-10-CM | POA: Diagnosis not present

## 2022-08-30 DIAGNOSIS — H903 Sensorineural hearing loss, bilateral: Secondary | ICD-10-CM | POA: Diagnosis not present

## 2022-08-30 DIAGNOSIS — H6993 Unspecified Eustachian tube disorder, bilateral: Secondary | ICD-10-CM | POA: Diagnosis not present

## 2022-08-30 DIAGNOSIS — Z87891 Personal history of nicotine dependence: Secondary | ICD-10-CM | POA: Diagnosis not present

## 2022-08-30 DIAGNOSIS — R1319 Other dysphagia: Secondary | ICD-10-CM

## 2022-09-04 ENCOUNTER — Encounter (INDEPENDENT_AMBULATORY_CARE_PROVIDER_SITE_OTHER): Payer: Self-pay | Admitting: *Deleted

## 2022-09-05 ENCOUNTER — Encounter (HOSPITAL_COMMUNITY)
Admission: RE | Admit: 2022-09-05 | Discharge: 2022-09-05 | Disposition: A | Discharge status: Home / Self Care | Payer: Medicare PPO | Source: Ambulatory Visit | Attending: Family Medicine | Admitting: Family Medicine

## 2022-09-05 ENCOUNTER — Encounter (HOSPITAL_COMMUNITY): Payer: Self-pay

## 2022-09-05 DIAGNOSIS — E209 Hypoparathyroidism, unspecified: Secondary | ICD-10-CM | POA: Diagnosis not present

## 2022-09-05 DIAGNOSIS — E213 Hyperparathyroidism, unspecified: Secondary | ICD-10-CM | POA: Diagnosis not present

## 2022-09-05 MED ORDER — TECHNETIUM TC 99M SESTAMIBI - CARDIOLITE
25.0000 | Freq: Once | INTRAVENOUS | Status: AC | PRN
  Administered 2022-09-05: 25.5 via INTRAVENOUS

## 2022-09-06 ENCOUNTER — Ambulatory Visit: Payer: Medicare PPO | Admitting: Gastroenterology

## 2022-09-11 ENCOUNTER — Ambulatory Visit (HOSPITAL_COMMUNITY)
Admission: RE | Admit: 2022-09-11 | Discharge: 2022-09-11 | Disposition: A | Discharge status: Home / Self Care | Payer: Medicare PPO | Source: Ambulatory Visit | Attending: Otolaryngology | Admitting: Otolaryngology

## 2022-09-11 ENCOUNTER — Other Ambulatory Visit (HOSPITAL_COMMUNITY): Payer: Self-pay | Admitting: Otolaryngology

## 2022-09-11 DIAGNOSIS — R1319 Other dysphagia: Secondary | ICD-10-CM | POA: Diagnosis not present

## 2022-09-11 DIAGNOSIS — R1312 Dysphagia, oropharyngeal phase: Secondary | ICD-10-CM | POA: Diagnosis not present

## 2022-09-11 DIAGNOSIS — T17320A Food in larynx causing asphyxiation, initial encounter: Secondary | ICD-10-CM | POA: Diagnosis not present

## 2022-09-11 DIAGNOSIS — R6339 Other feeding difficulties: Secondary | ICD-10-CM | POA: Diagnosis not present

## 2022-09-12 ENCOUNTER — Encounter: Payer: Self-pay | Admitting: *Deleted

## 2022-09-12 ENCOUNTER — Telehealth: Payer: Self-pay | Admitting: *Deleted

## 2022-09-12 ENCOUNTER — Ambulatory Visit (INDEPENDENT_AMBULATORY_CARE_PROVIDER_SITE_OTHER): Payer: Medicare PPO | Admitting: Gastroenterology

## 2022-09-12 ENCOUNTER — Encounter: Payer: Self-pay | Admitting: Gastroenterology

## 2022-09-12 DIAGNOSIS — R7989 Other specified abnormal findings of blood chemistry: Secondary | ICD-10-CM | POA: Diagnosis not present

## 2022-09-12 DIAGNOSIS — R1310 Dysphagia, unspecified: Secondary | ICD-10-CM | POA: Insufficient documentation

## 2022-09-12 NOTE — Telephone Encounter (Signed)
PA approved via [employer]. Authorization #[id number], DOS: 09/27/2022 - 12/26/2022

## 2022-09-12 NOTE — Progress Notes (Unsigned)
GI Office Note    Referring Provider: Cecile Sheerer, NP Primary Care Physician:  Pablo Lawrence, NP  Primary Gastroenterologist: Garfield Cornea, MD   Chief Complaint   Chief Complaint  Patient presents with   Dysphagia    Having issues with swallowing.     History of Present Illness   Donald Mckinney is a 76 y.o. male presenting today at the request of Pablo Lawrence, NP for further evaluation of elevated LFTs. He is also here to discuss dysphagia at the request of ENT.   Patient presents with his spouse today. Patient states he woke up one morning in 12/2021 with slurred speech, swallowing issues. He apparently had had some mild stuttering for several weeks as well. He was seen in the ED for evaluation due to concern for stroke. CT head, MRI brain with no acute findings, he was noted to have mildly advanced cerebral atrophy for age and empty sella. He was set up to see neurology as an outpatient. Seen for consultation, he was referred to speech therapist for evaluation. No follow up planned. He saw ST 01/2022 and at that time he was referred to ENT due to [facility], drainage and also advised to follow up with neurologist for lingual fasiculations. It appears that office was not able to reach patient to provide appt.  In 07/2022, he present with abdominal pain, SOB. Was hospitalized for evaluation. He also received additional work up for ongoing dysphagia, dysarthria. See below for MBSS, CT chest/abd/pelvis and CT neck. In addition he had repeat MRI brain, CT cervical spine. His LFTs were elevated with Tbili 2.7, AST 340, ALT 220. Trended downward by time of d/c. Acute viral panel was negative.  PCP since rechecked LFTs 08/21/22, AP 151, ALT 58, GGT 300, ANA neg, iron 56, iron sat 16, B12/folate normal, repeat acute viral hepatitis panel negative,   Patient feels like food stick in the upper throat when he tries to [place]. Feels like it gets caught in phlegm. He was trying benadryl and  flonase for the phlegm but not helping. Has to clear throat frequently. He also complains of ear pressure associated with hearing loss. When his ears pop, he can hear better. Due to his dysphagia he has lost 45 pounds this year.    He was advised by ST to take medications in puree such as yogurt. He does not drink from a straw. He has been able to drink 4 sixteen ounce bottles of water daily. He is consuming avocado boost shakes, baked sweet potatoes, sandwiches. He takes medication in yogurt. Has felt his plavix get stuck in his throat and dissolved there. He does not use straws. He is trying to get 70 grams of protein daily.    He denies heartburn. No further abdominal pain. BMs are regular. No melena, brbpr. In the mornings, he has to clear his throat out due to a lot of mucous. He feels like the mucous causing his food to get hung up in the throat region. When he has pressure in his ears, he can't hear well. When they "pop", he ears find. This happens daily.   He has been to ENT. Per patient ears looked fine, he was told he needed an EGD. According to Dr. Dorita Sciara note, he was setting him up for laryngology at [facility] with Dr. Joya Gaskins and referral to SLP for same visit. Advised to consider thickening thin liquids to reduce aspiration. Advised to see GI for EGD to evaluate esophagus. He  attempted barium esophagram but procedure terminated due to aspiration. Patient states he had to drink from a straw which he was previously advised not to by speech.   Barium esophagram 09/11/22: IMPRESSION: Premature termination of esophagram due to gross laryngeal penetration and silent aspiration of contrast into the trachea.   MBSS 08/15/22: Patient presents with a mild oropharyngeal dysphagia and suspected cervical esophageal phase dysphagia. During oral phase, patient exhibits prolonged mastication and transit of bolus from anterior to posterior portion of oral cavity as well as prolonged transit of  liquids. SLP suspects a contributing factor is patient's oral mucosa does not appear to have adequate moisture. During pharyngeal phase of swallow, patient exhibited swallow initiation delays to the level of vallecular sinus and trace to mild vallecular residuals with liquids and solids and trace pyriform sinus residuals s/p initial swallow, which cleared with subsequent swallows. He exhibited instances of silent penetration with thin liquids which occured during the swallow and did lead to one instance of aspiration just below the vocal cords. Patient was able to clear penetrate and aspirate with cued cough and reswallow. He was not able to orally transit barium tablet with thin liquids but he was able to (with delay) with puree solids. Esophageal sweep did not reveal any delays in transit or stasis of barium. SLP observed suspected cervical osteophyte (no radiologist present to confirm) which did appear to cause narrowing of the cervical esophagus. SLP recommending patient continue on current diet as he is able to select foods that he knows he can tolerate. In addition, recommendation is for pills whole in puree, clear throat and reswallow after sips of liquids. SLP will follow patient briefly for toleration.   SLP Visit Diagnosis Dysphagia, oropharyngeal phase (R13.12)  Impact on safety and function Mild aspiration risk   CT chest/abd/pelvis: IMPRESSION: 1. Mild aneurysmal dilation of the ascending aorta measures 4 cm in greatest diameter. Recommend annual imaging followup by CTA or MRA. This recommendation follows 2010 ACCF/AHA/AATS/ACR/ASA/SCA/SCAI/SIR/STS/SVM Guidelines for the Diagnosis and Management of Patients with Thoracic Aortic Disease. Circulation. 2010; 121: W299-B716. Aortic aneurysm NOS (ICD10-I71.9) 2. Infrarenal abdominal aortic aneurysm measures 3.4 cm in greatest diameter, minimally increased from 3.3 cm on September 07, 2021. Post placement of aorto-bi iliac stent graft with  increased eccentric nonocclusive mural thrombus within the aneurysmal aorta. Interval coiling of left iliac artery aneurysm. Recommend follow-up ultrasound every 3 years. This recommendation follows ACR consensus guidelines: White Paper of the ACR Incidental Findings Committee II on Vascular Findings. J Am Coll Radiol 2013; 10:789-794. 3. Question mild pericholecystic edema. No radiopaque gallbladder calculi. Further evaluation with right upper quadrant ultrasound may be considered. 4. Tiny nonobstructive renal calculi versus vascular calcifications in the upper pole of the right kidney. 5. Moderate prostatomegaly.  Please correlate to PSA values  CT NECK 07/2022: IMPRESSION: 1. No acute or focal lesion to explain the patient's symptoms. 2. Atherosclerotic changes at the carotid bifurcations bilaterally without significant stenosis.  CT chest/abd/pelvis 07/2022:  IMPRESSION: 1. Mild aneurysmal dilation of the ascending aorta measures 4 cm in greatest diameter. Recommend annual imaging followup by CTA or MRA. This recommendation follows 2010 ACCF/AHA/AATS/ACR/ASA/SCA/SCAI/SIR/STS/SVM Guidelines for the Diagnosis and Management of Patients with Thoracic Aortic Disease. Circulation. 2010; 121: R678-L381. Aortic aneurysm NOS (ICD10-I71.9) 2. Infrarenal abdominal aortic aneurysm measures 3.4 cm in greatest diameter, minimally increased from 3.3 cm on September 07, 2021. Post placement of aorto-bi iliac stent graft with increased eccentric nonocclusive mural thrombus within the aneurysmal aorta. Interval coiling of left iliac artery  aneurysm. Recommend follow-up ultrasound every 3 years. This recommendation follows ACR consensus guidelines: White Paper of the ACR Incidental Findings Committee II on Vascular Findings. J Am Coll Radiol 2013; 10:789-794. 3. Question mild pericholecystic edema. No radiopaque gallbladder calculi. Further evaluation with right upper quadrant ultrasound may be  considered. 4. Tiny nonobstructive renal calculi versus vascular calcifications in the upper pole of the right kidney. 5. Moderate prostatomegaly.  Please correlate to PSA values.   Aortic Atherosclerosis (ICD10-I70.0).   RUQ U/S 07/2022:  IMPRESSION: Gallbladder wall thickening. No visible gallstones or sonographic Murphy sign.  Medications   Current Outpatient Medications  Medication Sig Dispense Refill   clopidogrel (PLAVIX) 75 MG tablet Take 1 tablet by mouth once daily 30 tablet 11   fluticasone (FLONASE) 50 MCG/ACT nasal spray Place 1 spray into both nostrils daily.     Isopropyl Alcohol (SWIMMERS EAR DROPS OT) Place 2 drops into both ears as needed (for swimmer ear).     lisinopril (ZESTRIL) 5 MG tablet Take 1 tablet (5 mg total) by mouth daily. 90 tablet 3   loratadine (CLARITIN) 10 MG tablet Take 10 mg by mouth daily.     meclizine (ANTIVERT) 25 MG tablet Take 1 tablet (25 mg total) by mouth 3 (three) times daily as needed for dizziness. 15 tablet 0   Multiple Vitamin (MULTIVITAMIN) tablet Take 1 tablet by mouth daily. Centrum Mens     NITROSTAT 0.4 MG SL tablet DISSOLVE ONE TABLET UNDER THE TONGUE EVERY 5 MINUTES AS NEEDED FOR CHEST PAIN.  DO NOT EXCEED A TOTAL OF 3 DOSES IN 15 MINUTES. NO RELIEF, C 25 tablet 1   pantoprazole (PROTONIX) 40 MG tablet Take by mouth.     No current facility-administered medications for this visit.    Allergies   Allergies as of 09/12/2022 - Review Complete 09/12/2022  Allergen Reaction Noted   Ampicillin Other (See Comments) 04/26/2011   Tiotropium bromide-olodaterol Anaphylaxis 08/30/2022   Iodinated contrast media Itching and Rash 04/26/2011    Past Medical History   Past Medical History:  Diagnosis Date   AAA (abdominal aortic aneurysm) ([place])    Cancer ([place]) 2005   [place]   Cancer of skin, face Dec. 2013  and  Jan.  2014   [facility]  Left cheek and nose;  2016  Left Periorbital   Coronary artery disease    Hyperlipidemia     Hypertension    Leg pain    with walking   MI, old    INFERIOR WALL M.I.   Myocardial infarction ([place]) 10/24/2008   PVD (peripheral vascular disease) ([place])    MODERATE TO SEVERE, INVOLVING THE DISTAL AORTA AND BILATERAL ILIACS    Past Surgical History   Past Surgical History:  Procedure Laterality Date   ABDOMINAL AORTOGRAM N/A 01/17/2022   Procedure: ABDOMINAL AORTOGRAM;  Surgeon: Serafina Mitchell, MD;  Location: [place] [facility];  Service: Cardiovascular;  Laterality: N/A;   ABDOMINAL AORTOGRAM W/LOWER EXTREMITY N/A 10/30/2017   Procedure: ABDOMINAL AORTOGRAM W/LOWER EXTREMITY;  Surgeon: Waynetta Sandy, MD;  Location: [place] [facility];  Service: Cardiovascular;  Laterality: N/A;   CARDIAC CATHETERIZATION     WELL PRESERVED LEFT VENTRICULAR SYSTOLIC FUNCTION. EF 55%   CORONARY ANGIOPLASTY WITH STENT PLACEMENT     RIGHT CORONARY ARTERY, LEFT ANTERIOR DESCENDING ARTERY   CORONARY ARTERY BYPASS GRAFT     EMBOLIZATION  01/17/2022   Procedure: EMBOLIZATION;  Surgeon: Serafina Mitchell, MD;  Location: [place] [facility];  Service:  Cardiovascular;;   iliac artery stenting Bilateral Jan. 7, 2010   Bilateral CIA and Right External IA  stents    PELVIC ANGIOGRAPHY  01/17/2022   Procedure: PELVIC ANGIOGRAPHY;  Surgeon: Serafina Mitchell, MD;  Location: [place] [facility];  Service: Cardiovascular;;   PERIPHERAL VASCULAR INTERVENTION Left 01/17/2022   Procedure: PERIPHERAL VASCULAR INTERVENTION;  Surgeon: Serafina Mitchell, MD;  Location: [place] [facility];  Service: Cardiovascular;  Laterality: Left;  COMMON AND INT ILIAC   PRE-MALIGNANT / BENIGN SKIN LESION EXCISION Left Oct. 27, 2015   Posterior Neck- Benign mole    Past Family History   Family History  Problem Relation Age of Onset   Cancer Mother    Hyperlipidemia Mother    Cancer Father    Multiple sclerosis Brother     Past Social History   Social History   Socioeconomic History   Marital status: Married     Spouse name: Not on file   Number of children: Not on file   Years of education: Not on file   Highest education level: Associate degree: academic program  Occupational History   Not on file  Tobacco Use   Smoking status: Former    Types: Cigarettes    Quit date: 10/20/2008    Years since quitting: 13.9   Smokeless tobacco: Never  Vaping Use   Vaping Use: Never used  Substance and Sexual Activity   Alcohol use: Yes    Comment: states drinking 2-3 beers a day   Drug use: No   Sexual activity: Yes  Other Topics Concern   Not on file  Social History Narrative   Right handed    Caffeine- 1 cup per day   Lives at home with wife    Social Determinants of Health   Financial Resource Strain: Not on file  Food Insecurity: No Food Insecurity (08/14/2022)   Hunger Vital Sign    Worried About Running Out of Food in the Last Year: Never true    Ran Out of Food in the Last Year: Never true  Transportation Needs: No Transportation Needs (08/15/2022)   PRAPARE - Hydrologist (Medical): No    Lack of Transportation (Non-Medical): No  Physical Activity: Not on file  Stress: Not on file  Social Connections: Not on file  Intimate Partner Violence: Not At Risk (08/15/2022)   Humiliation, Afraid, Rape, and Kick questionnaire    Fear of Current or Ex-Partner: No    Emotionally Abused: No    Physically Abused: No    Sexually Abused: No    Review of Systems   General: Negative for  fever, chills, fatigue, weakness. See hpi Eyes: Negative for vision changes.  ENT: Negative for hoarseness,see npi CV: Negative for chest pain, angina, palpitations, dyspnea on exertion, peripheral edema.  Respiratory: Negative for dyspnea at rest, dyspnea on exertion, cough, sputum, wheezing.  GI: See history of present illness. GU:  Negative for dysuria, hematuria, urinary incontinence, urinary frequency, nocturnal urination.  MS: Negative for joint pain, low back pain.  Derm:  Negative for rash or itching.  Neuro: Negative for weakness, abnormal sensation, seizure, frequent headaches, memory loss,  confusion. See hpi Psych: Negative for anxiety, depression, suicidal ideation, hallucinations.  Endo: see hpi Heme: Negative for bruising or bleeding. Allergy: Negative for rash or hives.  Physical Exam   BP (!) 151/78 (BP Location: Right Arm, Patient Position: Sitting, Cuff Size: Normal)   Pulse 84   Temp 97.8  F (36.6 C) (Oral)   Ht '5\' 7"'$  (1.702 m)   Wt 141 lb 3.2 oz (64 kg)   SpO2 96%   BMI 22.12 kg/m    General: Well-nourished, well-developed in no acute distress. Speech somewhat slow but clear. Speaks as if deaf/hearing impaired Head: Normocephalic, atraumatic.   Eyes: Conjunctiva pink, no icterus. Mouth: Oropharyngeal mucosa moist and pink , no lesions erythema or exudate. Neck: Supple without thyromegaly, masses, or lymphadenopathy.  Lungs: Clear to auscultation bilaterally.  Heart: Regular rate and rhythm, no murmurs rubs or gallops.  Abdomen: Bowel sounds are normal, nontender, nondistended, no hepatosplenomegaly or masses,  no abdominal bruits or hernia, no rebound or guarding.   Rectal: not performed Extremities: No lower extremity edema. No clubbing or deformities.  Neuro: Alert and oriented x 4 , grossly normal neurologically.  Skin: Warm and dry, no rash or jaundice.   Psych: Alert and cooperative, normal mood and affect.  Labs   Lab Results  Component Value Date   CREATININE 0.59 (L) 08/16/2022   BUN 5 (L) 08/16/2022   NA 137 08/16/2022   K 3.3 (L) 08/16/2022   CL 98 08/16/2022   CO2 27 08/16/2022   Lab Results  Component Value Date   WBC 9.0 08/16/2022   HGB 12.2 (L) 08/16/2022   HCT 37.7 (L) 08/16/2022   MCV 92.9 08/16/2022   PLT 232 08/16/2022   Lab Results  Component Value Date   ALT 132 (H) 08/16/2022   AST 67 (H) 08/16/2022   ALKPHOS 92 08/16/2022   BILITOT 1.0 08/16/2022   07/2022: Hep B surf Ag: NR, HCV Ab NR,  Hep A IgM NR, Hep B C IgM NR   October 2023: GGT 300, ANA neg, total bilirubin 0.5, alkaline phosphatase 151, AST 40, ALT 58, albumin 4.5.  Vitamin B12 482, folate greater than 20, iron 56, iron saturation 16%, TIBC 342, viral markers negative for acute viral hepatitis August 21, 2022. Imaging Studies   DG ESOPHAGUS W SINGLE CM (SOL OR THIN BA)  Result Date: 09/11/2022 CLINICAL DATA:  Trouble swallowing, choking, oropharyngeal dysphagia, suspected stroke in July EXAM: ESOPHOGRAM/BARIUM SWALLOW TECHNIQUE: Single contrast examination was performed using barium. FLUOROSCOPY: Radiation Exposure Index (as provided by the fluoroscopic device): 3.4 mGy Kerma COMPARISON:  None Available. FINDINGS: Only 2 swallows of barium were evaluated. With both swallows, laryngeal penetration and aspiration of contrast into trachea were identified. No spontaneous cough reflex identified. Contrast passed caudally to nearly the carina. Due to degree of silent aspiration, procedure was terminated prematurely. Incidentally noted atherosclerotic calcification aorta. Patient tolerated procedure well. Patient did instructed coughing to help clear aspirated contrast. IMPRESSION: Premature termination of esophagram due to gross laryngeal penetration and silent aspiration of contrast into the trachea. Findings called to Jenetta Downer MD on 09/11/2022 at approximately 1035 hours. Electronically Signed   By: Lavonia Dana M.D.   On: 09/11/2022 11:01   NM Parathyroid W/Spect  Result Date: 09/06/2022 CLINICAL DATA:  Hyperparathyroidism.  Calcium 8.8. EXAM: NM PARATHYROID SCINTIGRAPHY AND SPECT IMAGING TECHNIQUE: Following intravenous administration of radiopharmaceutical, early and 2-hour delayed planar images were obtained in the anterior projection. Delayed triplanar SPECT images were also obtained at 2 hours. RADIOPHARMACEUTICALS:  25.5 mCi Tc-30mSestamibi IV COMPARISON:  None Available. FINDINGS: Normal washout of radiotracer activity  from the thyroid gland at 2 hour imaging. No focality on planar imaging. SPECT imaging of the neck and chest demonstrate mild residual activity in the thyroid gland. No focal activity to localize parathyroid  adenoma IMPRESSION: No focal activity in the thyroid bed to localize parathyroid adenoma. Electronically Signed   By: Suzy Bouchard M.D.   On: 09/06/2022 13:52   MR CERVICAL SPINE WO CONTRAST  Result Date: 08/16/2022 CLINICAL DATA:  Cervical radiculopathy EXAM: MRI CERVICAL SPINE WITHOUT CONTRAST TECHNIQUE: Multiplanar, multisequence MR imaging of the cervical spine was performed. No intravenous contrast was administered. COMPARISON:  None Available. FINDINGS: Alignment: Mild straightening of the normal cervical lordosis. No listhesis. Vertebrae: No fracture, evidence of discitis, or bone lesion. Cord: Normal signal and morphology. Posterior Fossa, vertebral arteries, paraspinal tissues: Negative. Disc levels: C2-C3: Minimal disc bulge. No spinal canal stenosis or neural foraminal narrowing. C3-C4: No significant disc bulge. No spinal canal stenosis or neuroforaminal narrowing. C4-C5: No significant disc bulge. No spinal canal stenosis or neuroforaminal narrowing. C5-C6: Minimal disc bulge. No spinal canal stenosis or neural foraminal narrowing. C6-C7: Minimal disc bulge. Uncovertebral and facet arthropathy. No spinal canal stenosis. Mild right neural foraminal narrowing. C7-T1: No significant disc bulge. Mild facet and uncovertebral hypertrophy. No spinal canal stenosis. Mild bilateral neural foraminal narrowing. IMPRESSION: 1. No spinal canal stenosis. 2. Mild neural foraminal narrowing on the right at C6-C7 and bilaterally at C7-T1. Electronically Signed   By: Merilyn Baba M.D.   On: 08/16/2022 03:00   MR BRAIN WO CONTRAST  Result Date: 08/16/2022 CLINICAL DATA:  Shortness of breath and dysphagia, dysarthria, and weight loss EXAM: MRI HEAD WITHOUT CONTRAST TECHNIQUE: Multiplanar, multiecho pulse  sequences of the brain and surrounding structures were obtained without intravenous contrast. COMPARISON:  12/29/2021 FINDINGS: Brain: No restricted diffusion to suggest acute or subacute infarct. No acute hemorrhage, mass mass effect, or midline shift. No hydrocephalus or extra-axial collection. No hemosiderin deposition to suggest remote hemorrhage. Scattered T2 hyperintense signal in the periventricular white matter, likely the sequela of mild chronic small vessel ischemic disease. Cerebral volume is within normal limits for age. Empty sella. Normal craniocervical junction. Vascular: Normal arterial flow voids. Skull and upper cervical spine: Normal marrow signal. Sinuses/Orbits: No acute finding. Other: The mastoids are well aerated. IMPRESSION: No acute intracranial process. No evidence of acute or subacute infarct. Electronically Signed   By: Merilyn Baba M.D.   On: 08/16/2022 02:37   DG Swallowing Func-Speech Pathology  Result Date: 08/15/2022 Table formatting from the original result was not included. Objective Swallowing Evaluation: Type of Study: MBS-Modified Barium Swallow Study  Patient Details Name: AIRIK GOODLIN MRN: [id number] Date of Birth: [birth year] Today's Date: 08/15/2022 Time: SLP Start Time (ACUTE ONLY): 1000 -SLP Stop Time (ACUTE ONLY): 1020 SLP Time Calculation (min) (ACUTE ONLY): 20 min Past Medical History: Past Medical History: Diagnosis Date  AAA (abdominal aortic aneurysm) ([place])   Cancer ([place]) 2005  [place]  Cancer of skin, face Dec. 2013  and  Jan.  2014  [facility]  Left cheek and nose;  2016  Left Periorbital  Coronary artery disease   Hyperlipidemia   Hypertension   Leg pain   with walking  MI, old   INFERIOR WALL M.I.  Myocardial infarction ([place]) 10/24/2008  PVD (peripheral vascular disease) ([place])   MODERATE TO SEVERE, INVOLVING THE DISTAL AORTA AND BILATERAL ILIACS Past Surgical History: Past Surgical History: Procedure Laterality Date  ABDOMINAL AORTOGRAM N/A 01/17/2022  Procedure:  ABDOMINAL AORTOGRAM;  Surgeon: Serafina Mitchell, MD;  Location: [place] [facility];  Service: Cardiovascular;  Laterality: N/A;  ABDOMINAL AORTOGRAM W/LOWER EXTREMITY N/A 10/30/2017  Procedure: ABDOMINAL AORTOGRAM W/LOWER EXTREMITY;  Surgeon: Waynetta Sandy, MD;  Location:  [place] [facility];  Service: Cardiovascular;  Laterality: N/A;  CARDIAC CATHETERIZATION    WELL PRESERVED LEFT VENTRICULAR SYSTOLIC FUNCTION. EF 55%  CORONARY ANGIOPLASTY WITH STENT PLACEMENT    RIGHT CORONARY ARTERY, LEFT ANTERIOR DESCENDING ARTERY  CORONARY ARTERY BYPASS GRAFT    EMBOLIZATION  01/17/2022  Procedure: EMBOLIZATION;  Surgeon: Serafina Mitchell, MD;  Location: [place] [facility];  Service: Cardiovascular;;  iliac artery stenting Bilateral Jan. 7, 2010  Bilateral CIA and Right External IA  stents   PELVIC ANGIOGRAPHY  01/17/2022  Procedure: PELVIC ANGIOGRAPHY;  Surgeon: Serafina Mitchell, MD;  Location: [place] [facility];  Service: Cardiovascular;;  PERIPHERAL VASCULAR INTERVENTION Left 01/17/2022  Procedure: PERIPHERAL VASCULAR INTERVENTION;  Surgeon: Serafina Mitchell, MD;  Location: [place] [facility];  Service: Cardiovascular;  Laterality: Left;  COMMON AND INT ILIAC  PRE-MALIGNANT / BENIGN SKIN LESION EXCISION Left Oct. 27, 2015  Posterior Neck- Benign mole HPI: 76yo male admitted 08/13/22 with abdominal pain and SOB.  PMH: Several month history of SOB, dysphagia, dysarthria, and weight loss. CAD, aortic aneurysm, HTN, smoking, PVD.  Subjective: pleasant cooperative  Recommendations for follow up therapy are one component of a multi-disciplinary discharge planning process, led by the attending physician.  Recommendations may be updated based on patient status, additional functional criteria and insurance authorization. Assessment / Plan / Recommendation   08/15/2022  12:39 PM Clinical Impressions Clinical Impression Patient presents with a mild oropharyngeal dysphagia and suspected cervical esophageal phase dysphagia.  During oral phase, patient exhibits prolonged mastication and transit of bolus from anterior to posterior portion of oral cavity as well as prolonged transit of liquids. SLP suspects a contributing factor is patient's oral mucosa does not appear to have adequate moisture. During pharyngeal phase of swallow, patient exhibited swallow initiation delays to the level of vallecular sinus and trace to mild vallecular residuals with liquids and solids and trace pyriform sinus residuals s/p initial swallow, which cleared with subsequent swallows. He exhibited instances of silent penetration with thin liquids which occured during the swallow and did lead to one instance of aspiration just below the vocal cords. Patient was able to clear penetrate and aspirate with cued cough and reswallow. He was not able to orally transit barium tablet with thin liquids but he was able to (with delay) with puree solids. Esophageal sweep did not reveal any delays in transit or stasis of barium. SLP observed suspected cervical osteophyte (no radiologist present to confirm) which did appear to cause narrowing of the cervical esophagus. SLP recommending patient continue on current diet as he is able to select foods that he knows he can tolerate. In addition, recommendation is for pills whole in puree, clear throat and reswallow after sips of liquids. SLP will follow patient briefly for toleration. SLP Visit Diagnosis Dysphagia, oropharyngeal phase (R13.12) Impact on safety and function Mild aspiration risk     08/15/2022  12:39 PM Treatment Recommendations Treatment Recommendations Therapy as outlined in treatment plan below     08/15/2022   1:00 PM Prognosis Prognosis for Safe Diet Advancement Good   08/15/2022  12:39 PM Diet Recommendations SLP Diet Recommendations Regular solids;Thin liquid Liquid Administration via Cup;No straw Medication Administration Whole meds with puree Compensations Minimize environmental distractions;Slow rate;Small  sips/bites Postural Changes Seated upright at 90 degrees     08/15/2022  12:39 PM Other Recommendations Oral Care Recommendations Oral care BID Follow Up Recommendations Follow physician's recommendations for discharge plan and follow up therapies Assistance recommended at discharge None  Functional Status Assessment Patient has had a recent decline in their functional status and demonstrates the ability to make significant improvements in function in a reasonable and predictable amount of time.   08/15/2022  12:39 PM Frequency and Duration  Speech Therapy Frequency (ACUTE ONLY) min 1 x/week Treatment Duration 1 week     08/15/2022  12:15 PM Oral Phase Oral Phase Impaired Oral - Nectar Cup Reduced posterior propulsion;Weak lingual manipulation Oral - Thin Cup Reduced posterior propulsion;Weak lingual manipulation Oral - Puree Weak lingual manipulation;Reduced posterior propulsion;Impaired mastication Oral - Mech Soft Reduced posterior propulsion;Weak lingual manipulation Oral - Pill Weak lingual manipulation;Reduced posterior propulsion;Decreased bolus cohesion    08/15/2022  12:30 PM Pharyngeal Phase Pharyngeal Phase Impaired Pharyngeal- Nectar Cup Delayed swallow initiation-vallecula;Pharyngeal residue - valleculae;Pharyngeal residue - pyriform Pharyngeal- Thin Cup Delayed swallow initiation-vallecula;Pharyngeal residue - valleculae;Pharyngeal residue - pyriform;Penetration/Aspiration during swallow;Trace aspiration Pharyngeal Material enters airway, remains ABOVE vocal cords and not ejected out;Material enters airway, passes BELOW cords then ejected out Pharyngeal- Puree Delayed swallow initiation-vallecula;Reduced pharyngeal peristalsis;Pharyngeal residue - valleculae Pharyngeal- Mechanical Soft Delayed swallow initiation-vallecula;Pharyngeal residue - valleculae Pharyngeal- Pill [facility]    08/15/2022  12:38 PM Cervical Esophageal Phase  Cervical Esophageal Phase Impaired Cervical Esophageal Comment presence of cervical  osteophyte (no radiologist present to confirm) which did appear to cause narrowing of upper esphagus Sonia Baller, MA, CCC-SLP Speech Therapy                     ECHOCARDIOGRAM COMPLETE  Result Date: 08/14/2022    ECHOCARDIOGRAM REPORT   Patient Name:   Donald Mckinney Date of Exam: 08/14/2022 Medical Rec #:  [id number]       Height:       67.0 in Accession #:    [id number]      Weight:       151.0 lb Date of Birth:  1946-07-27       BSA:          1.795 m Patient Age:    59 years        BP:           98/62 mmHg Patient Gender: M               HR:           64 bpm. Exam Location:  Inpatient Procedure: 2D Echo, Cardiac Doppler and Color Doppler Indications:    R94.31 Abnormal EKG  History:        Patient has no prior history of Echocardiogram examinations.                 Previous Myocardial Infarction and CAD; Risk                 Factors:Hypertension and Dyslipidemia.  Sonographer:    Bernadene Person RDCS Referring Phys: [place]  1. Left ventricular ejection fraction, by estimation, is 55 to 60%. Left ventricular ejection fraction by PLAX is 58 %. The left ventricle has normal function. The left ventricle has no regional wall motion abnormalities. Left ventricular diastolic parameters are consistent with Grade I diastolic dysfunction (impaired relaxation).  2. Right ventricular systolic function is normal. The right ventricular size is normal. There is normal pulmonary artery systolic pressure. The estimated right ventricular systolic pressure is 60.1 mmHg.  3. The mitral valve is abnormal. Mild mitral valve regurgitation.  4. The aortic valve is tricuspid. Aortic valve regurgitation is not visualized.  5. Aortic dilatation  noted. There is mild dilatation of the ascending aorta, measuring 41 mm.  6. The inferior vena cava is dilated in size with <50% respiratory variability, suggesting right atrial pressure of 15 mmHg. Comparison(s): No prior Echocardiogram. FINDINGS  Left Ventricle: Left  ventricular ejection fraction, by estimation, is 55 to 60%. Left ventricular ejection fraction by PLAX is 58 %. The left ventricle has normal function. The left ventricle has no regional wall motion abnormalities. The left ventricular internal cavity size was normal in size. There is no left ventricular hypertrophy. Left ventricular diastolic parameters are consistent with Grade I diastolic dysfunction (impaired relaxation). Indeterminate filling pressures. Right Ventricle: The right ventricular size is normal. No increase in right ventricular wall thickness. Right ventricular systolic function is normal. There is normal pulmonary artery systolic pressure. The tricuspid regurgitant velocity is 2.10 m/s, and  with an assumed right atrial pressure of 15 mmHg, the estimated right ventricular systolic pressure is 84.1 mmHg. Left Atrium: Left atrial size was normal in size. Right Atrium: Right atrial size was normal in size. Prominent Eustachian valve. Pericardium: There is no evidence of pericardial effusion. Mitral Valve: The mitral valve is abnormal. There is mild thickening of the anterior and posterior mitral valve leaflet(s). Mild mitral valve regurgitation. Tricuspid Valve: The tricuspid valve is grossly normal. Tricuspid valve regurgitation is trivial. Aortic Valve: The aortic valve is tricuspid. Aortic valve regurgitation is not visualized. Pulmonic Valve: The pulmonic valve was normal in structure. Pulmonic valve regurgitation is not visualized. Aorta: Aortic dilatation noted. There is mild dilatation of the ascending aorta, measuring 41 mm. Venous: The inferior vena cava is dilated in size with less than 50% respiratory variability, suggesting right atrial pressure of 15 mmHg. IAS/Shunts: No atrial level shunt detected by color flow Doppler.  LEFT VENTRICLE PLAX 2D LV EF:         Left            Diastology                ventricular     LV e' medial:    7.36 cm/s                ejection        LV E/e' medial:   11.9                fraction by     LV e' lateral:   9.79 cm/s                PLAX is 58      LV E/e' lateral: 9.0                %. LVIDd:         5.20 cm LVIDs:         3.60 cm LV PW:         1.00 cm LV IVS:        0.70 cm LVOT diam:     2.20 cm LV SV:         97 LV SV Index:   54 LVOT Area:     3.80 cm  LV Volumes (MOD) LV vol d, MOD    113.0 ml A2C: LV vol d, MOD    111.0 ml A4C: LV vol s, MOD    51.1 ml A2C: LV vol s, MOD    53.4 ml A4C: LV SV MOD A2C:   61.9 ml LV SV MOD A4C:   111.0 ml LV SV MOD  BP:    62.6 ml RIGHT VENTRICLE RV Basal diam:  3.20 cm RV Mid diam:    4.20 cm RV S prime:     15.70 cm/s TAPSE (M-mode): 2.7 cm LEFT ATRIUM             Index        RIGHT ATRIUM           Index LA diam:        3.80 cm 2.12 cm/m   RA Area:     19.30 cm LA Vol (A2C):   60.0 ml 33.43 ml/m  RA Volume:   55.00 ml  30.65 ml/m LA Vol (A4C):   53.4 ml 29.76 ml/m LA Biplane Vol: 57.6 ml 32.10 ml/m  AORTIC VALVE LVOT Vmax:   114.00 cm/s LVOT Vmean:  71.000 cm/s LVOT VTI:    0.254 m  AORTA Ao Root diam: 3.50 cm Ao Asc diam:  4.10 cm MITRAL VALVE               TRICUSPID VALVE MV Area (PHT): 4.33 cm    TR Peak grad:   17.6 mmHg MV Decel Time: 175 msec    TR Vmax:        210.00 cm/s MV E velocity: 87.80 cm/s MV A velocity: 69.80 cm/s  SHUNTS MV E/A ratio:  1.26        Systemic VTI:  0.25 m                            Systemic Diam: 2.20 cm Lyman Bishop MD Electronically signed by Lyman Bishop MD Signature Date/Time: 08/14/2022/11:53:30 AM    Final    US Abdomen Limited  Result Date: 08/13/2022 CLINICAL DATA:  Abdominal pain EXAM: ULTRASOUND ABDOMEN LIMITED RIGHT UPPER QUADRANT COMPARISON:  CT today FINDINGS: Gallbladder: Gallbladder wall thickening at 4.4 mm. No visible stones or sonographic Murphy sign. Common bile duct: Diameter: Normal caliber, 5 6 mm where visualized. Distal duct obscured by overlying bowel gas Liver: No focal lesion identified. Within normal limits in parenchymal echogenicity. Portal vein is  patent on color Doppler imaging with normal direction of blood flow towards the liver. Other: None. IMPRESSION: Gallbladder wall thickening. No visible gallstones or sonographic Murphy sign. Electronically Signed   By: Rolm Baptise M.D.   On: 08/13/2022 18:05   CT Soft Tissue Neck W Contrast  Result Date: 08/13/2022 CLINICAL DATA:  Difficulty swallowing. EXAM: CT NECK WITH CONTRAST TECHNIQUE: Multidetector CT imaging of the neck was performed using the standard protocol following the bolus administration of intravenous contrast. RADIATION DOSE REDUCTION: This exam was performed according to the departmental dose-optimization program which includes automated exposure control, adjustment of the mA and/or kV according to patient size and/or use of iterative reconstruction technique. CONTRAST:  57m OMNIPAQUE IOHEXOL 350 MG/ML SOLN COMPARISON:  None Available. FINDINGS: Pharynx and larynx: No focal mucosal or submucosal lesions are present. Nasopharynx is clear. Soft palate and tongue base are within normal limits. Vallecula and epiglottis are within normal limits. Aryepiglottic folds and piriform sinuses are clear. Vocal cords are midline and symmetric. Trachea is clear. Salivary glands: The submandibular and parotid glands and ducts are within normal limits. Thyroid: Normal Lymph nodes: No significant adenopathy is present. Vascular: Atherosclerotic calcifications are present at the carotid bifurcations bilaterally without significant stenosis Limited intracranial: Within normal limits. Mastoids and visualized paranasal sinuses: The paranasal sinuses and mastoid air cells are clear. Skeleton: Vertebral body heights alignment  are normal. No focal osseous lesions are present. Upper chest: The lung apices are clear. The thoracic inlet is within normal limits. IMPRESSION: 1. No acute or focal lesion to explain the patient's symptoms. 2. Atherosclerotic changes at the carotid bifurcations bilaterally without  significant stenosis. Electronically Signed   By: San Morelle M.D.   On: 08/13/2022 16:02   CT CHEST ABDOMEN PELVIS W CONTRAST  Result Date: 08/13/2022 CLINICAL DATA:  Unintentional weight loss. EXAM: CT CHEST, ABDOMEN, AND PELVIS WITH CONTRAST TECHNIQUE: Multidetector CT imaging of the chest, abdomen and pelvis was performed following the standard protocol during bolus administration of intravenous contrast. RADIATION DOSE REDUCTION: This exam was performed according to the departmental dose-optimization program which includes automated exposure control, adjustment of the mA and/or kV according to patient size and/or use of iterative reconstruction technique. CONTRAST:  34m OMNIPAQUE IOHEXOL 350 MG/ML SOLN COMPARISON:  September 07, 2021 FINDINGS: CT CHEST FINDINGS Cardiovascular: Mild aneurysmal dilation of the ascending aorta measures 4 cm in greatest diameter. The aortic arch measures 3 cm. The descending aorta measures 3.5 cm in greatest dimension. Tortuosity and calcific atherosclerotic disease of the aorta. Enlarged cardiac silhouette. No significant pericardial effusion. Calcific atherosclerotic disease of the coronary arteries. Mediastinum/Nodes: No enlarged mediastinal, hilar, or axillary lymph nodes. Thyroid gland, trachea, and esophagus demonstrate no significant findings. Lungs/Pleura: Bibasilar atelectasis versus peribronchial airspace consolidation. Musculoskeletal: No chest wall mass or suspicious bone lesions identified. CT ABDOMEN PELVIS FINDINGS Hepatobiliary: Normal appearance of the liver. Question mild pericholecystic edema. No radiopaque gallbladder calculi. No evidence of intra or extrahepatic biliary ductal dilation. Pancreas: Unremarkable. No pancreatic ductal dilatation or surrounding inflammatory changes. Spleen: Normal in size without focal abnormality. Adrenals/Urinary Tract: Tiny nonobstructive renal calculi versus vascular calcifications in the upper pole of the right  kidney. Normal adrenal glands. Normal ureters and urinary bladder. Stomach/Bowel: Stomach is within normal limits. No evidence of appendicitis. No evidence of bowel wall thickening, distention, or inflammatory changes. Vascular/Lymphatic: Calcific atherosclerotic disease and tortuosity of the aorta. Infrarenal abdominal aortic aneurysm measures 3.4 cm in greatest diameter, minimally increased from 3.3 cm on September 07, 2021. There has been placement of aorto bi iliac stent graft. There is an increased eccentric nonocclusive mural thrombus within the aneurysmal aorta. Interval coiling of left iliac artery aneurysm. Reproductive: The prostate gland is moderately enlarged measuring 5.6 cm. Other: No abdominal wall hernia or abnormality. No abdominopelvic ascites. Musculoskeletal: No acute or significant osseous findings. IMPRESSION: 1. Mild aneurysmal dilation of the ascending aorta measures 4 cm in greatest diameter. Recommend annual imaging followup by CTA or MRA. This recommendation follows 2010 ACCF/AHA/AATS/ACR/ASA/SCA/SCAI/SIR/STS/SVM Guidelines for the Diagnosis and Management of Patients with Thoracic Aortic Disease. Circulation. 2010; 121:: I347-Q259 Aortic aneurysm NOS (ICD10-I71.9) 2. Infrarenal abdominal aortic aneurysm measures 3.4 cm in greatest diameter, minimally increased from 3.3 cm on September 07, 2021. Post placement of aorto-bi iliac stent graft with increased eccentric nonocclusive mural thrombus within the aneurysmal aorta. Interval coiling of left iliac artery aneurysm. Recommend follow-up ultrasound every 3 years. This recommendation follows ACR consensus guidelines: White Paper of the ACR Incidental Findings Committee II on Vascular Findings. J Am Coll Radiol 2013; 10:789-794. 3. Question mild pericholecystic edema. No radiopaque gallbladder calculi. Further evaluation with right upper quadrant ultrasound may be considered. 4. Tiny nonobstructive renal calculi versus vascular calcifications in  the upper pole of the right kidney. 5. Moderate prostatomegaly.  Please correlate to PSA values. Aortic Atherosclerosis (ICD10-I70.0). Electronically Signed   By: DLinwood DibblesD.  On: 08/13/2022 15:55    Assessment   Dysphagia: complex situation with symptoms associated with dysarthria, hearing loss, swallowing issues and substantial weight loss. Failed barium swallow due to aspiration and termination of exam. MBSS as outlined above, swallowing tips provided but no modifications in diet advised. Neurology evaluation initial unremarkable, although speech recommended follow up for lingual fasciculation but never completed. ENT has referred patient to laryngology at [facility] and advised another MBSS. Also recommended thicken liquids. ENT recommending esophagus to be evaluated by EGD to rule out esophageal component to swallowing issues.   Elevated LFTs: presented with new elevation and abdominal pain in 07/2022. Abdominal pain has resolved. Imaging as noted above. LFTs have improved but persistently elevated, further work up recommended.    PLAN    EGD+/-ED. ASA 3.  I have discussed the risks, alternatives, benefits with regards to but not limited to the risk of reaction to medication, bleeding, infection, perforation and the patient is agreeable to proceed. Written consent to be obtained. Recommend using thickening for liquids. Extensive labs planned.  Advised patient to contact ENT about MBSS, he just recently completed while inpatient. He may not need repeat study.   Laureen Ochs. Bobby Rumpf, [place], [facility]

## 2022-09-12 NOTE — Patient Instructions (Signed)
Please complete your labs as soon as possible at [place]. Upper endoscopy to be scheduled. See separate instructions.

## 2022-09-13 ENCOUNTER — Encounter: Payer: Self-pay | Admitting: *Deleted

## 2022-09-14 DIAGNOSIS — R633 Feeding difficulties, unspecified: Secondary | ICD-10-CM | POA: Diagnosis not present

## 2022-09-14 DIAGNOSIS — R1312 Dysphagia, oropharyngeal phase: Secondary | ICD-10-CM | POA: Diagnosis not present

## 2022-09-14 DIAGNOSIS — R253 Fasciculation: Secondary | ICD-10-CM | POA: Diagnosis not present

## 2022-09-14 DIAGNOSIS — M2578 Osteophyte, vertebrae: Secondary | ICD-10-CM | POA: Diagnosis not present

## 2022-09-21 DIAGNOSIS — J9601 Acute respiratory failure with hypoxia: Secondary | ICD-10-CM | POA: Diagnosis not present

## 2022-09-21 NOTE — Progress Notes (Signed)
Called pt, no answer. Left Voicemail. Will call back at a later time.

## 2022-09-22 ENCOUNTER — Encounter (HOSPITAL_COMMUNITY)
Admission: RE | Admit: 2022-09-22 | Discharge: 2022-09-22 | Disposition: A | Discharge status: Home / Self Care | Payer: Medicare PPO | Source: Ambulatory Visit | Attending: Internal Medicine | Admitting: Internal Medicine

## 2022-09-22 ENCOUNTER — Telehealth: Payer: Self-pay | Admitting: *Deleted

## 2022-09-22 NOTE — Progress Notes (Signed)
Called pt for PAT. Spoke with wife who told me about recent barium swallow testing that resulted in aspiration into trachea and urgent consult with Dr. Tillman Abide, neuromuscular specialist @ [facility]. Dr Charna Elizabeth and Rourk would like to postpone EGD until after this consult. Pt and wife aware.

## 2022-09-22 NOTE — Telephone Encounter (Signed)
Per endo "Dr Charna Elizabeth, anesthesiologist, would like this patient postponed until after scheduled Neuromuscular consult with Dr Tillman Abide @ [facility]. Pt aspirated with barium swallow and had urgent referral."  Per Dr. Gala Romney "Communication noted. That sounds like a good idea. Giles Currie, please circle back after patient has been over to [facility] like patient is scheduled with [facility]21 for neuro and 12/4 with ENT/Head & Neck Surgery.   Endo was making pt/spouse aware of cancellation. FYI to leslie.

## 2022-09-22 NOTE — Telephone Encounter (Signed)
Noted. Please make sure we follow back up with patient after work up completed at [facility].

## 2022-09-24 ENCOUNTER — Encounter: Payer: Self-pay | Admitting: Gastroenterology

## 2022-09-25 ENCOUNTER — Other Ambulatory Visit (HOSPITAL_COMMUNITY): Payer: Medicare PPO

## 2022-09-25 DIAGNOSIS — R634 Abnormal weight loss: Secondary | ICD-10-CM | POA: Diagnosis not present

## 2022-09-25 DIAGNOSIS — R17 Unspecified jaundice: Secondary | ICD-10-CM | POA: Diagnosis not present

## 2022-09-25 DIAGNOSIS — R1084 Generalized abdominal pain: Secondary | ICD-10-CM | POA: Diagnosis not present

## 2022-09-25 DIAGNOSIS — Z955 Presence of coronary angioplasty implant and graft: Secondary | ICD-10-CM | POA: Diagnosis not present

## 2022-09-25 DIAGNOSIS — E119 Type 2 diabetes mellitus without complications: Secondary | ICD-10-CM | POA: Diagnosis not present

## 2022-09-25 DIAGNOSIS — R531 Weakness: Secondary | ICD-10-CM | POA: Diagnosis not present

## 2022-09-25 DIAGNOSIS — K8071 Calculus of gallbladder and bile duct without cholecystitis with obstruction: Secondary | ICD-10-CM | POA: Diagnosis not present

## 2022-09-25 DIAGNOSIS — R1011 Right upper quadrant pain: Secondary | ICD-10-CM | POA: Diagnosis not present

## 2022-09-25 DIAGNOSIS — K8080 Other cholelithiasis without obstruction: Secondary | ICD-10-CM | POA: Diagnosis not present

## 2022-09-25 DIAGNOSIS — R918 Other nonspecific abnormal finding of lung field: Secondary | ICD-10-CM | POA: Diagnosis not present

## 2022-09-25 DIAGNOSIS — R042 Hemoptysis: Secondary | ICD-10-CM | POA: Diagnosis not present

## 2022-09-25 DIAGNOSIS — J90 Pleural effusion, not elsewhere classified: Secondary | ICD-10-CM | POA: Diagnosis not present

## 2022-09-25 DIAGNOSIS — R0602 Shortness of breath: Secondary | ICD-10-CM | POA: Diagnosis not present

## 2022-09-25 DIAGNOSIS — I714 Abdominal aortic aneurysm, without rupture, unspecified: Secondary | ICD-10-CM | POA: Diagnosis not present

## 2022-09-25 DIAGNOSIS — E43 Unspecified severe protein-calorie malnutrition: Secondary | ICD-10-CM | POA: Diagnosis not present

## 2022-09-25 DIAGNOSIS — R633 Feeding difficulties, unspecified: Secondary | ICD-10-CM | POA: Diagnosis not present

## 2022-09-25 DIAGNOSIS — J9621 Acute and chronic respiratory failure with hypoxia: Secondary | ICD-10-CM | POA: Diagnosis not present

## 2022-09-25 DIAGNOSIS — K828 Other specified diseases of gallbladder: Secondary | ICD-10-CM | POA: Diagnosis not present

## 2022-09-25 DIAGNOSIS — I2510 Atherosclerotic heart disease of native coronary artery without angina pectoris: Secondary | ICD-10-CM | POA: Diagnosis not present

## 2022-09-25 DIAGNOSIS — R1319 Other dysphagia: Secondary | ICD-10-CM | POA: Diagnosis not present

## 2022-09-25 DIAGNOSIS — K8070 Calculus of gallbladder and bile duct without cholecystitis without obstruction: Secondary | ICD-10-CM | POA: Diagnosis not present

## 2022-09-25 DIAGNOSIS — J9602 Acute respiratory failure with hypercapnia: Secondary | ICD-10-CM | POA: Diagnosis not present

## 2022-09-25 DIAGNOSIS — J95821 Acute postprocedural respiratory failure: Secondary | ICD-10-CM | POA: Diagnosis not present

## 2022-09-25 DIAGNOSIS — Z7901 Long term (current) use of anticoagulants: Secondary | ICD-10-CM | POA: Diagnosis not present

## 2022-09-25 DIAGNOSIS — Z9049 Acquired absence of other specified parts of digestive tract: Secondary | ICD-10-CM | POA: Diagnosis not present

## 2022-09-25 DIAGNOSIS — J690 Pneumonitis due to inhalation of food and vomit: Secondary | ICD-10-CM | POA: Diagnosis not present

## 2022-09-25 DIAGNOSIS — I10 Essential (primary) hypertension: Secondary | ICD-10-CM | POA: Diagnosis not present

## 2022-09-25 DIAGNOSIS — N2889 Other specified disorders of kidney and ureter: Secondary | ICD-10-CM | POA: Diagnosis not present

## 2022-09-25 DIAGNOSIS — K8050 Calculus of bile duct without cholangitis or cholecystitis without obstruction: Secondary | ICD-10-CM | POA: Diagnosis not present

## 2022-09-25 DIAGNOSIS — J9622 Acute and chronic respiratory failure with hypercapnia: Secondary | ICD-10-CM | POA: Diagnosis not present

## 2022-09-25 DIAGNOSIS — Z95828 Presence of other vascular implants and grafts: Secondary | ICD-10-CM | POA: Diagnosis not present

## 2022-09-25 DIAGNOSIS — J9611 Chronic respiratory failure with hypoxia: Secondary | ICD-10-CM | POA: Diagnosis not present

## 2022-09-25 DIAGNOSIS — K811 Chronic cholecystitis: Secondary | ICD-10-CM | POA: Diagnosis not present

## 2022-09-25 DIAGNOSIS — Z4682 Encounter for fitting and adjustment of non-vascular catheter: Secondary | ICD-10-CM | POA: Diagnosis not present

## 2022-09-25 DIAGNOSIS — K838 Other specified diseases of biliary tract: Secondary | ICD-10-CM | POA: Diagnosis not present

## 2022-09-25 DIAGNOSIS — Z781 Physical restraint status: Secondary | ICD-10-CM | POA: Diagnosis not present

## 2022-09-25 DIAGNOSIS — R253 Fasciculation: Secondary | ICD-10-CM | POA: Diagnosis not present

## 2022-09-25 DIAGNOSIS — R7989 Other specified abnormal findings of blood chemistry: Secondary | ICD-10-CM | POA: Diagnosis not present

## 2022-09-25 DIAGNOSIS — R1310 Dysphagia, unspecified: Secondary | ICD-10-CM | POA: Diagnosis not present

## 2022-09-25 DIAGNOSIS — R7401 Elevation of levels of liver transaminase levels: Secondary | ICD-10-CM | POA: Diagnosis not present

## 2022-09-25 DIAGNOSIS — I493 Ventricular premature depolarization: Secondary | ICD-10-CM | POA: Diagnosis not present

## 2022-09-25 NOTE — Telephone Encounter (Signed)
Noted  

## 2022-09-26 LAB — COMPREHENSIVE METABOLIC PANEL
ALT: 14 IU/L (ref 0–44)
AST: 19 IU/L (ref 0–40)
Albumin/Globulin Ratio: 2.1 (ref 1.2–2.2)
Albumin: 4.9 g/dL — ABNORMAL HIGH (ref 3.8–4.8)
Alkaline Phosphatase: 68 IU/L (ref 44–121)
BUN/Creatinine Ratio: 28 — ABNORMAL HIGH (ref 10–24)
BUN: 12 mg/dL (ref 8–27)
Bilirubin Total: 0.5 mg/dL (ref 0.0–1.2)
CO2: 31 mmol/L — ABNORMAL HIGH (ref 20–29)
Calcium: 10.3 mg/dL — ABNORMAL HIGH (ref 8.6–10.2)
Chloride: 90 mmol/L — ABNORMAL LOW (ref 96–106)
Creatinine, Ser: 0.43 mg/dL — ABNORMAL LOW (ref 0.76–1.27)
Globulin, Total: 2.3 g/dL (ref 1.5–4.5)
Glucose: 82 mg/dL (ref 70–99)
Potassium: 4.2 mmol/L (ref 3.5–5.2)
Sodium: 139 mmol/L (ref 134–144)
Total Protein: 7.2 g/dL (ref 6.0–8.5)
eGFR: 112 mL/min/{1.73_m2} (ref >59)

## 2022-09-26 LAB — ANTI-SMOOTH MUSCLE ANTIBODY, IGG: Smooth Muscle Ab: 9 Units (ref 0–19)

## 2022-09-26 LAB — MYASTHENIA GRAVIS PROFILE
AChR Binding Ab, Serum: <0.03 nmol/L (ref 0.00–0.24)
AChR-modulating Ab: 2 % (ref 0–45)
Acetylchol Block Ab: 21 % (ref 0–25)
Anti-striation Abs: NEGATIVE

## 2022-09-26 LAB — CERULOPLASMIN: Ceruloplasmin: 28.2 mg/dL (ref 16.0–31.0)

## 2022-09-26 LAB — IGG, IGA, IGM
IgA/Immunoglobulin A, Serum: 539 mg/dL — ABNORMAL HIGH (ref 61–437)
IgG (Immunoglobin G), Serum: 923 mg/dL (ref 603–1613)
IgM (Immunoglobulin M), Srm: 54 mg/dL (ref 15–143)

## 2022-09-26 LAB — MUSK ANTIBODIES: MuSK Antibodies: <1 U/mL

## 2022-09-26 LAB — MITOCHONDRIAL ANTIBODIES: Mitochondrial Ab: <20 Units (ref 0.0–20.0)

## 2022-09-27 ENCOUNTER — Encounter (HOSPITAL_COMMUNITY): Admission: RE | Payer: Self-pay | Source: Home / Self Care

## 2022-09-27 ENCOUNTER — Ambulatory Visit (HOSPITAL_COMMUNITY): Admission: RE | Admit: 2022-09-27 | Payer: Medicare PPO | Source: Home / Self Care | Admitting: Internal Medicine

## 2022-09-27 SURGERY — ESOPHAGOGASTRODUODENOSCOPY (EGD) WITH PROPOFOL
Anesthesia: Monitor Anesthesia Care

## 2022-10-20 DEATH — deceased

## 2022-10-30 ENCOUNTER — Telehealth: Payer: Self-pay | Admitting: Gastroenterology

## 2022-10-30 ENCOUNTER — Telehealth: Payer: Self-pay | Admitting: Internal Medicine

## 2022-10-30 NOTE — Telephone Encounter (Signed)
Patient is deceased.

## 2022-11-01 NOTE — Telephone Encounter (Signed)
Opened in error

## 2022-11-02 IMAGING — CT CT ABD-PELV W/O CM
2 of 5 series · 13 of 46 positions shown, 15 images · non-contrast
Comparison: 12/03/2017

CLINICAL DATA: Postop AAA repair.

EXAM:
CT ABDOMEN AND PELVIS WITHOUT CONTRAST
TECHNIQUE: Multidetector CT imaging of the abdomen and pelvis was performed
following the standard protocol without IV contrast.

[Series 4: routine abdomen pelvis without 2.00 br40 s3 cor · coronal · non-contrast · 0.78mm/px · 3 of 146 slices shown]
[im 49/146  soft-tissue]
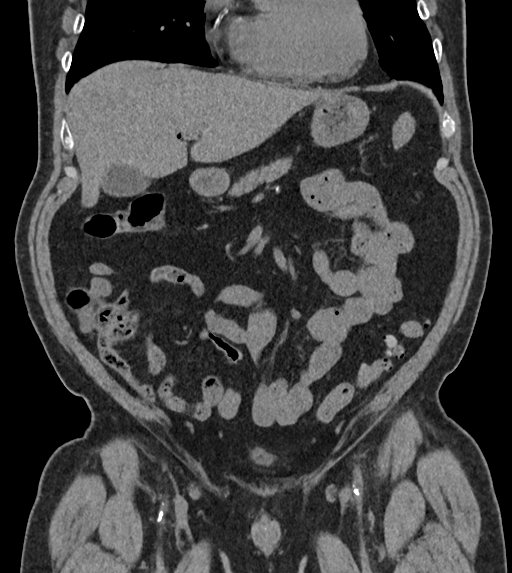
[im 65/146  soft-tissue]
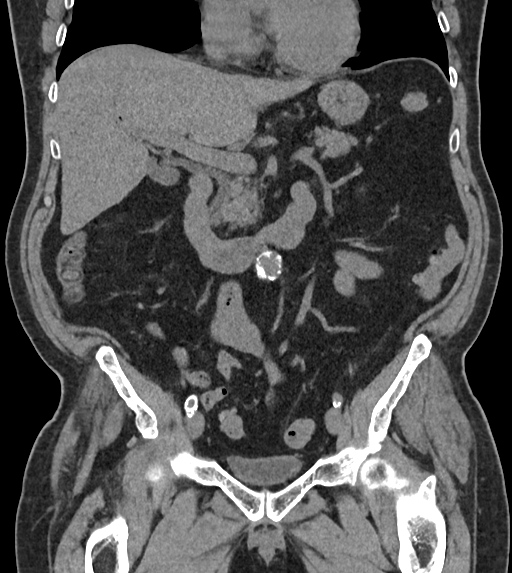
[im 81/146  soft-tissue]
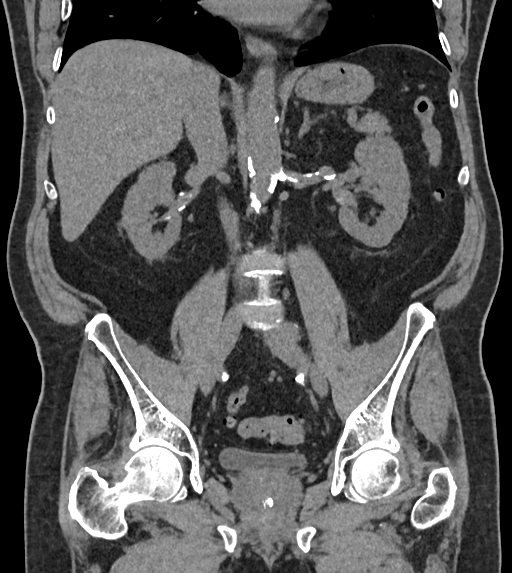

[Series 11: routine abdomen pelvis without 2.00 br40 s3 axial · axial · non-contrast · 0.57mm/px · z∈[+1293,+1673]mm · 10 of 223 slices shown, 12 images]
[im 22/223  soft-tissue]
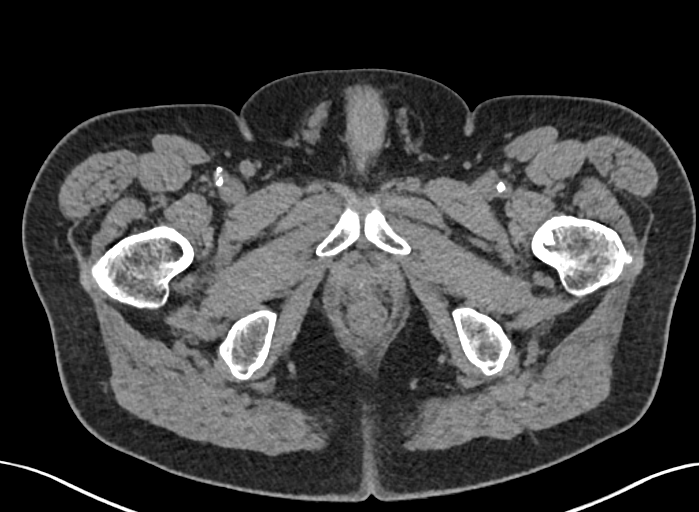
[im 22/223  bone]
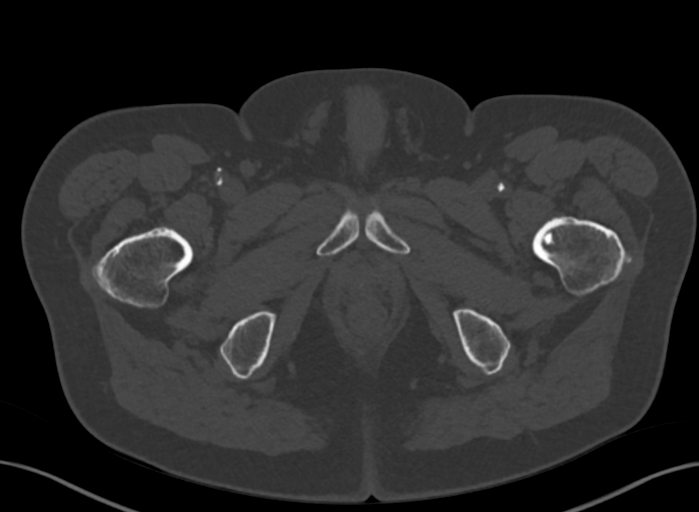
[im 43/223  soft-tissue]
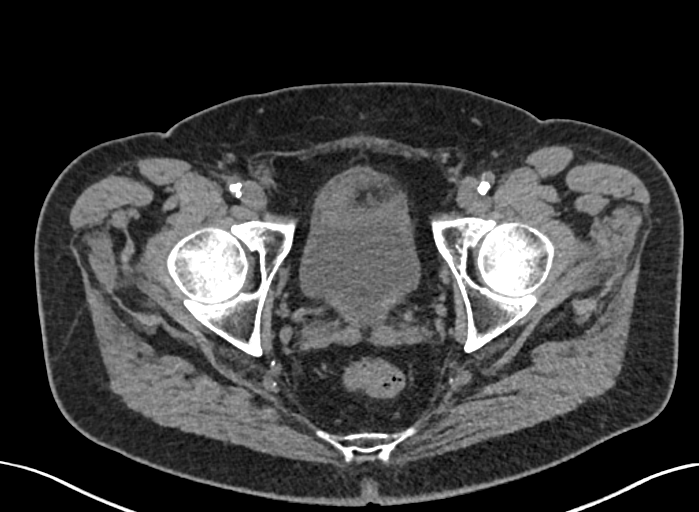
[im 64/223  soft-tissue]
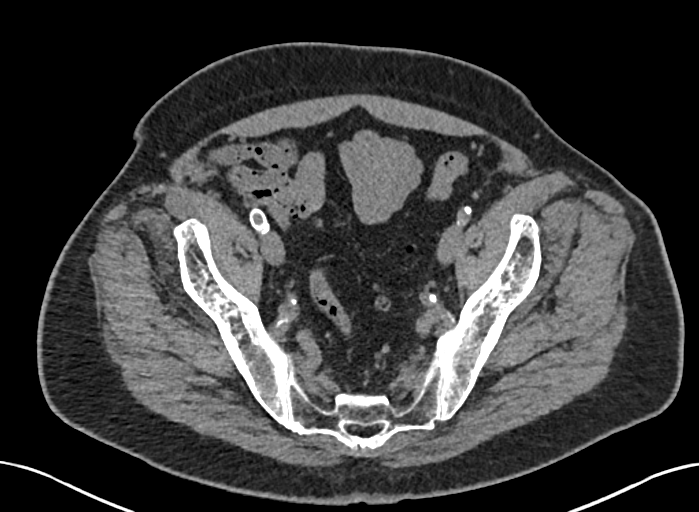
[im 85/223  soft-tissue]
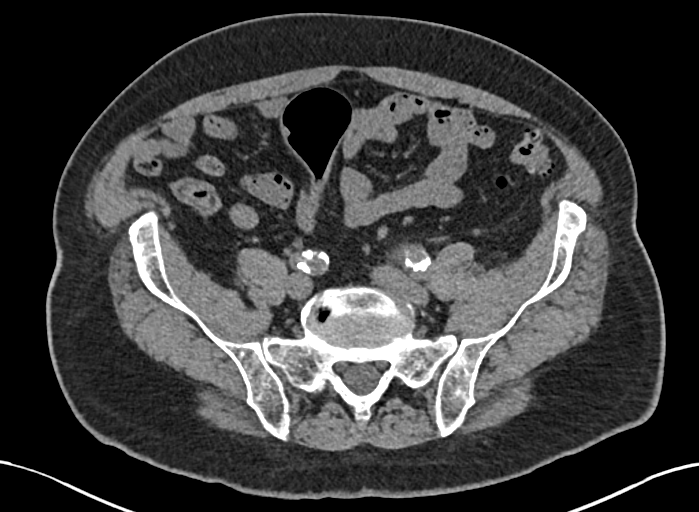
[im 106/223  soft-tissue]
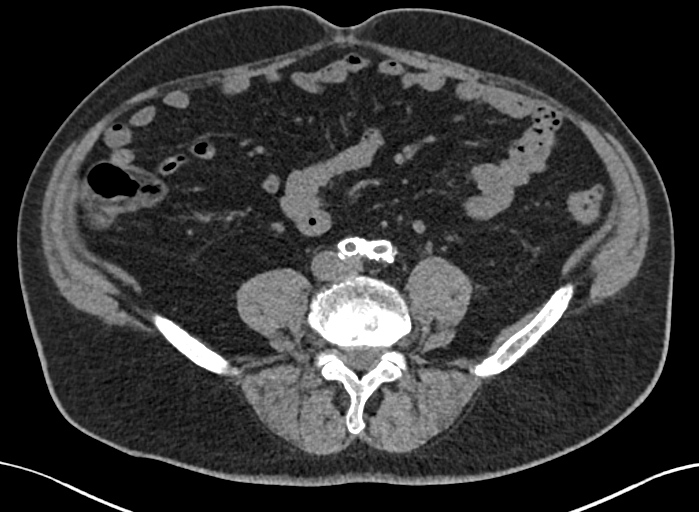
[im 127/223  soft-tissue]
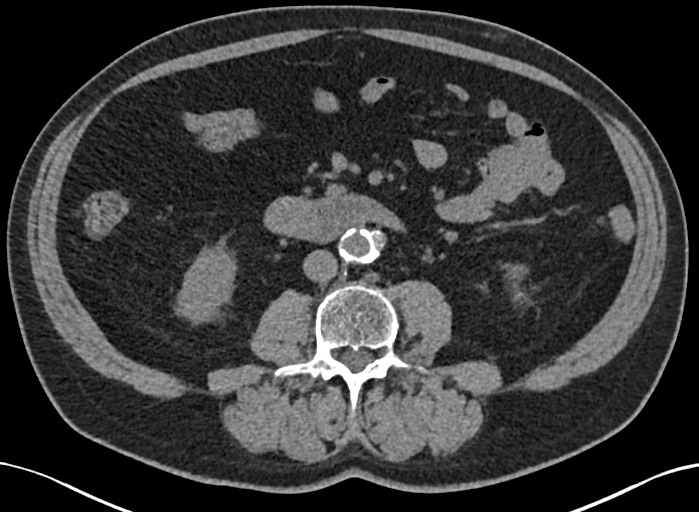
[im 149/223  soft-tissue]
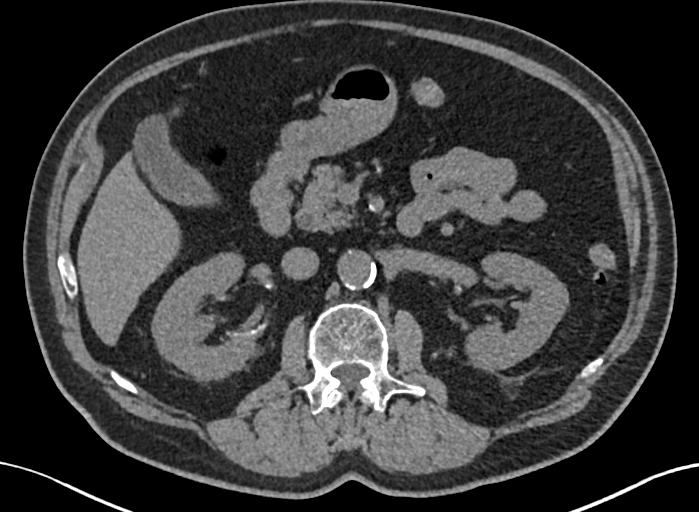
[im 170/223  soft-tissue]
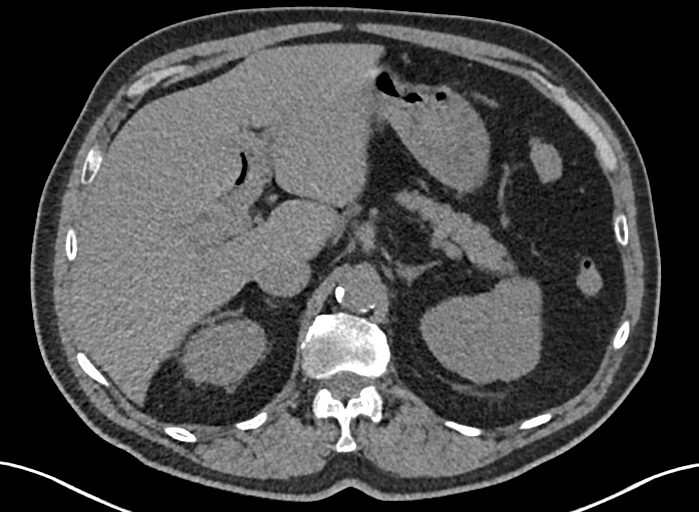
[im 191/223  soft-tissue]
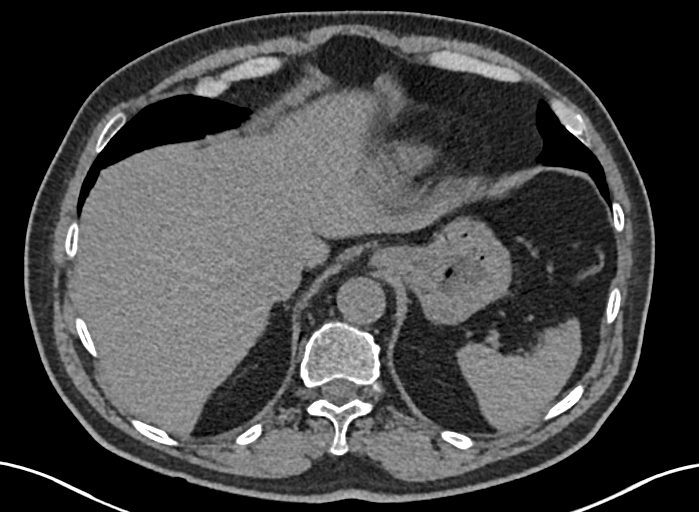
[im 191/223  bone]
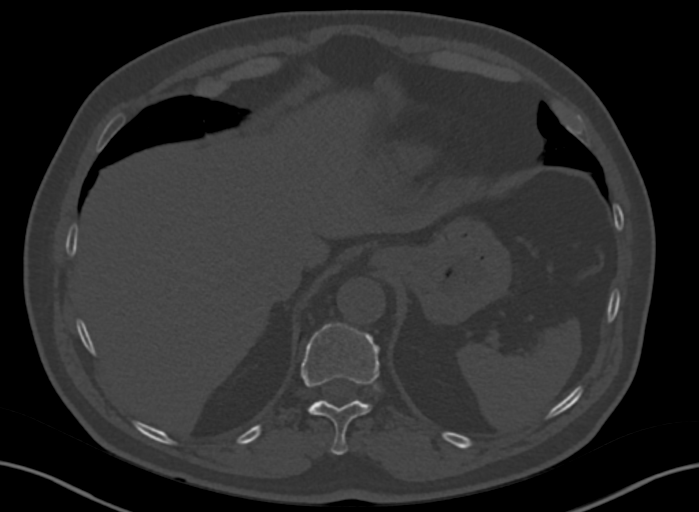
[im 212/223  soft-tissue]
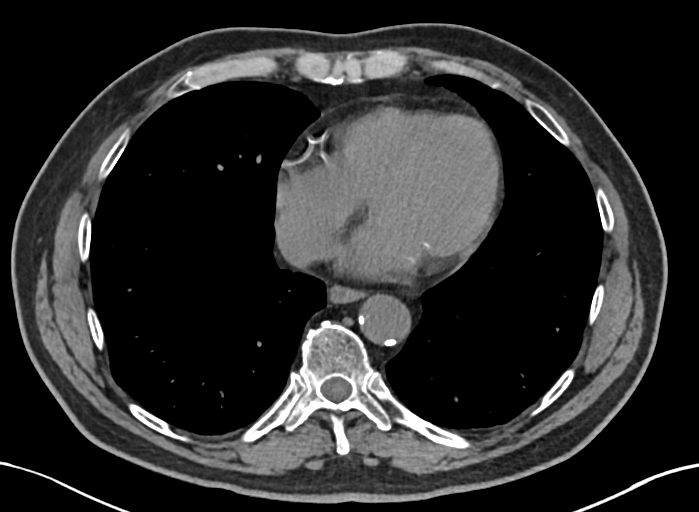

[13 of 46 positions shown; findings below may reference images not displayed]

FINDINGS: Lower chest: No acute abnormality.

Hepatobiliary: No focal hepatic abnormality. Gallbladder
unremarkable.

Pancreas: No focal abnormality or ductal dilatation.

Spleen: No focal abnormality.  Normal size.

Adrenals/Urinary Tract: Extensive renovascular calcifications. No
adrenal abnormality. No focal renal abnormality. No stones or
hydronephrosis. Urinary bladder is unremarkable.

Stomach/Bowel: Sigmoid diverticulosis. No active diverticulitis.
Stomach and small bowel decompressed, unremarkable.

Vascular/Lymphatic: 3.2 cm infrarenal abdominal aortic aneurysm
noted compared to 3.3 cm previously. Bilateral common iliac artery
stents are noted. Aneurysmal dilatation of the left common iliac
artery measures 3.1 cm compared to 2.8 cm previously. Extensive
calcifications noted throughout the aorta and iliac vessels.

Reproductive: Prostate enlargement.

Other: No free fluid or free air.

Musculoskeletal: No acute bony abnormality.
IMPRESSION: Infrarenal abdominal aortic aneurysm measuring 3.2 cm, stable.

3.1 cm left common iliac artery aneurysm compared to 2.8 cm
previously.

Diffuse aortoiliac atherosclerosis.

Sigmoid diverticulosis.

Prostate enlargement.

## 2022-11-16 ENCOUNTER — Ambulatory Visit (INDEPENDENT_AMBULATORY_CARE_PROVIDER_SITE_OTHER): Payer: Medicare PPO | Admitting: Gastroenterology

## 2023-03-31 IMAGING — CT CT CTA ABD/PEL W/CM AND/OR W/O CM
2 of 8 series · 15 of 46 positions shown, 17 images · IV contrast (APPLIED)
Comparison: 04/11/2021 and previous

CLINICAL DATA: Aneurysm surveillance

EXAM:
CTA ABDOMEN AND PELVIS WITH CONTRAST
TECHNIQUE: Multidetector CT imaging of the abdomen and pelvis was performed
using the standard protocol during bolus administration of
intravenous contrast. Multiplanar reconstructed images and MIPs were
obtained and reviewed to evaluate the vascular anatomy.
CONTRAST:  60mL OMNIPAQUE IOHEXOL 350 MG/ML SOLN

[Series 6: thins · axial · 0.93mm/px · z∈[-481,-63]mm · 12 of 1140 slices shown, 14 images]
[im 48/1140  soft-tissue]
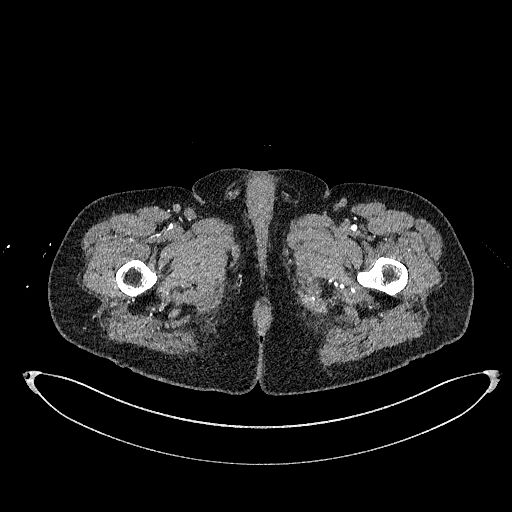
[im 48/1140  bone]
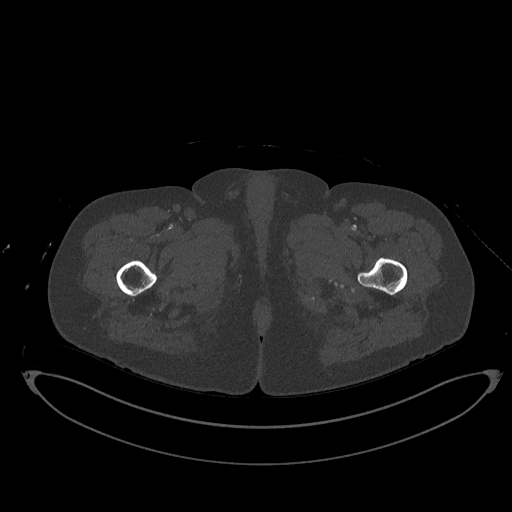
[im 143/1140  soft-tissue]
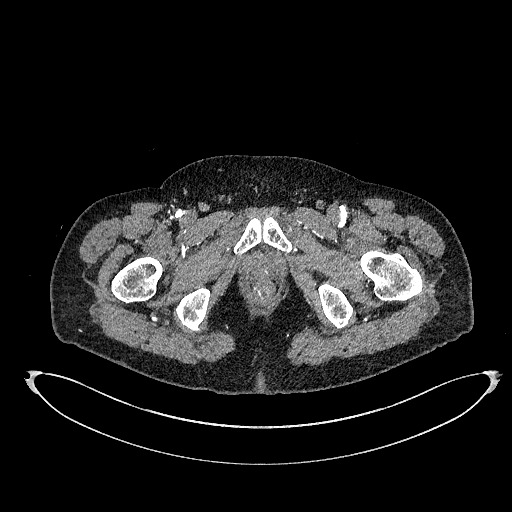
[im 238/1140  soft-tissue]
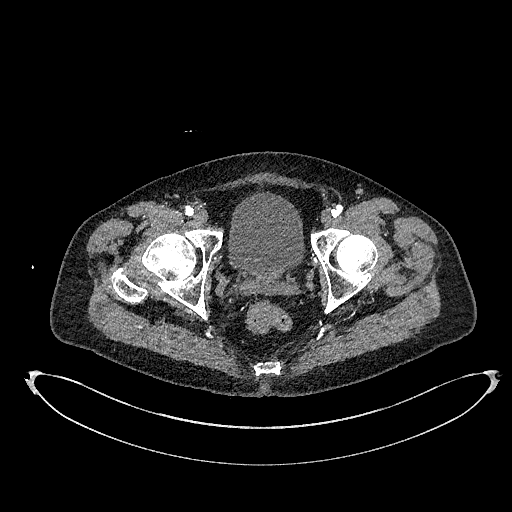
[im 333/1140  soft-tissue]
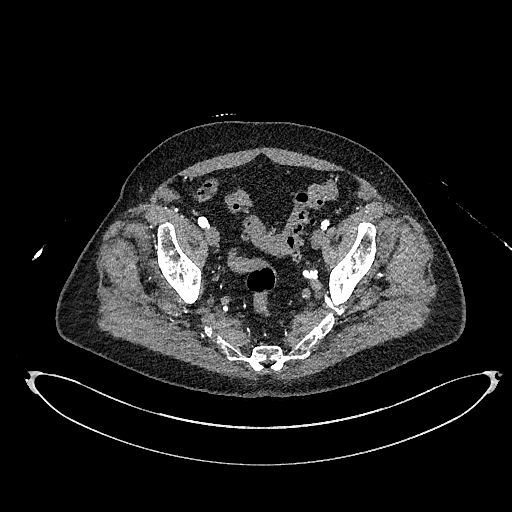
[im 428/1140  soft-tissue]
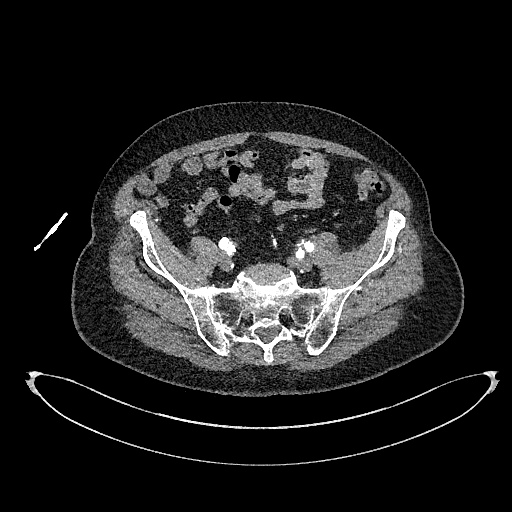
[im 523/1140  soft-tissue]
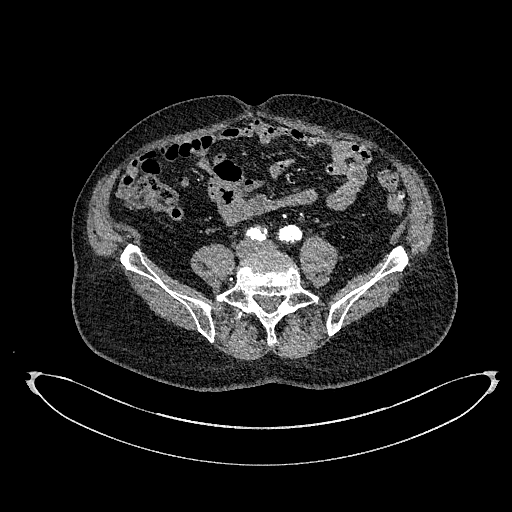
[im 617/1140  soft-tissue]
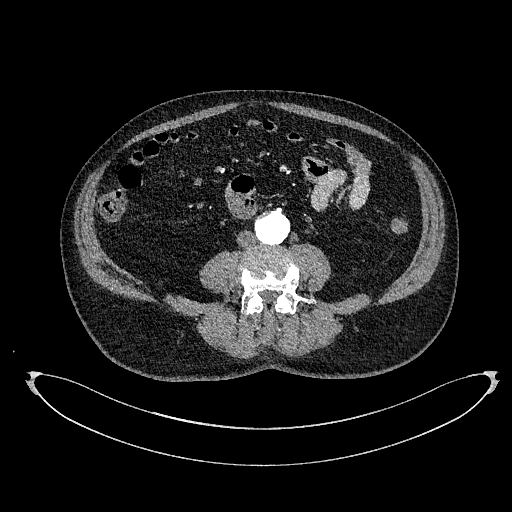
[im 712/1140  soft-tissue]
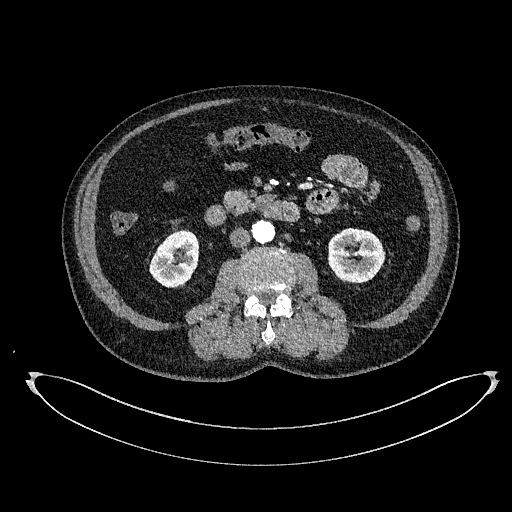
[im 807/1140  soft-tissue]
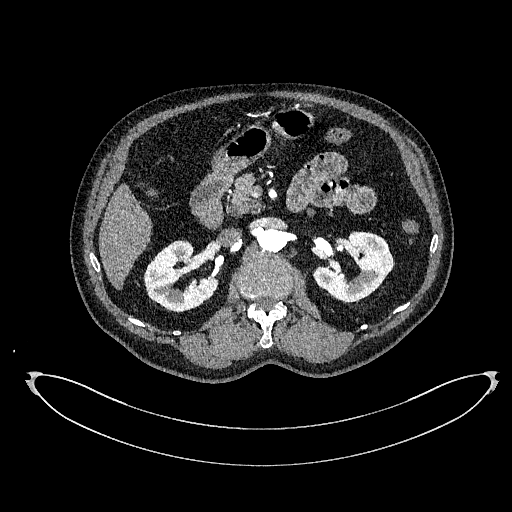
[im 807/1140  bone]
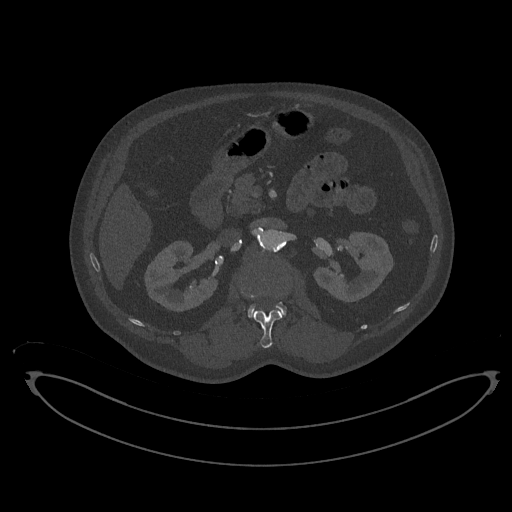
[im 902/1140  soft-tissue]
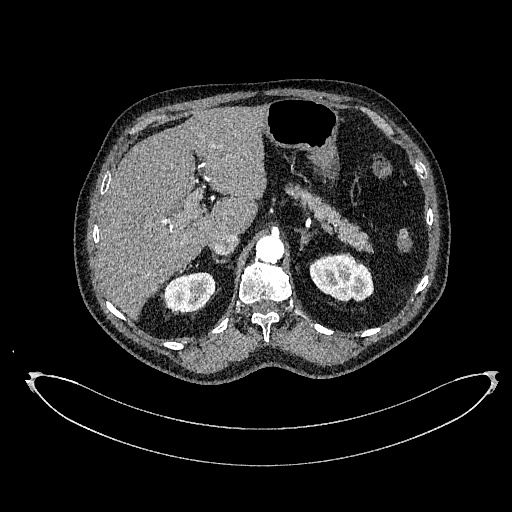
[im 997/1140  soft-tissue]
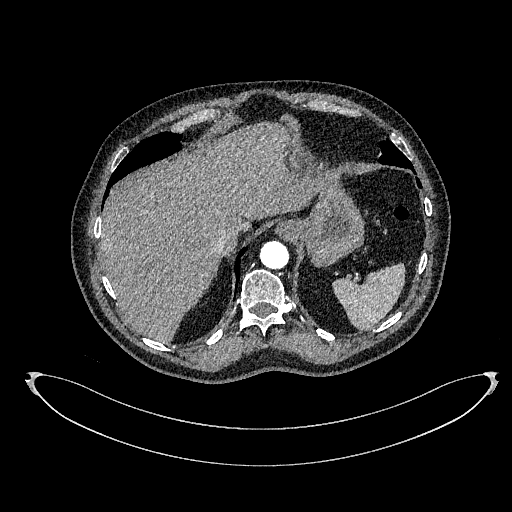
[im 1092/1140  soft-tissue]
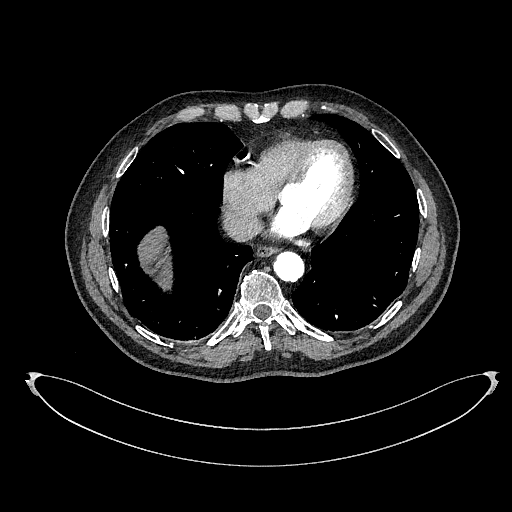

[Series 8: cor · coronal · 0.80mm/px · 3 of 151 slices shown]
[im 38/151  soft-tissue]
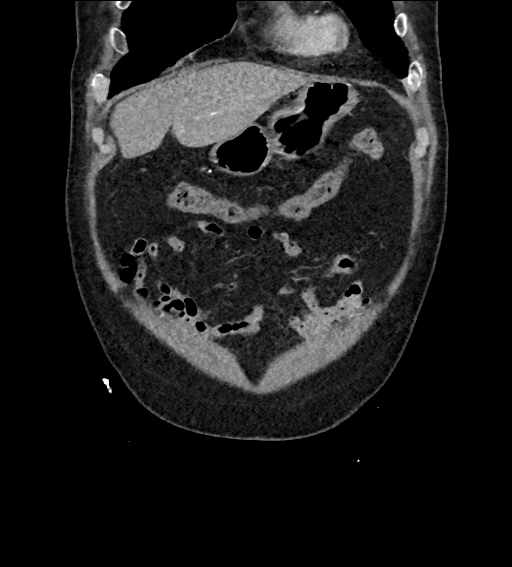
[im 76/151  soft-tissue]
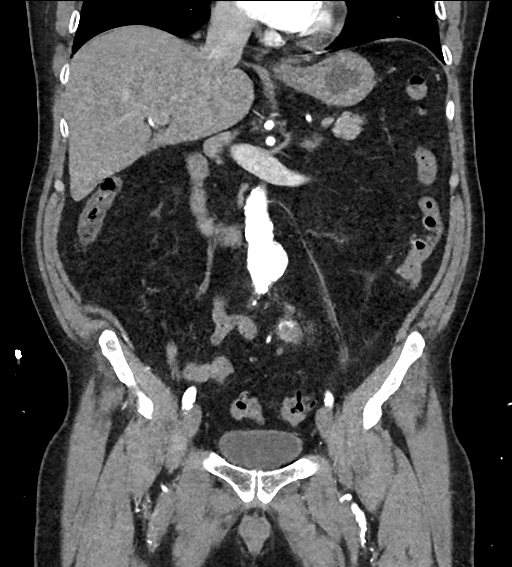
[im 113/151  soft-tissue]
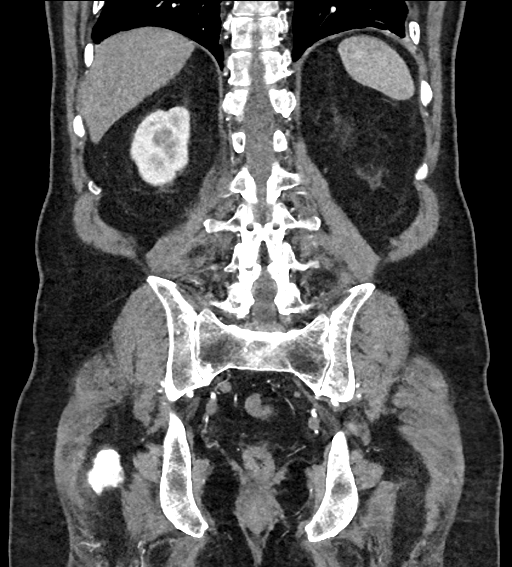

[15 of 46 positions shown; findings below may reference images not displayed]

FINDINGS: VASCULAR

Coronary calcifications.

Aorta: Calcified atheromatous plaque. No dissection or stenosis.
Infrarenal fusiform 3.3 x 3.2 cm aneurysm (previously 3.2)
containing some eccentric nonocclusive mural thrombus. Patent
kissing stents extend across the bifurcation. No evidence of leak or
rupture.

Celiac: Patent without evidence of aneurysm, dissection, vasculitis
or significant stenosis.

SMA: Patent without evidence of aneurysm, dissection, vasculitis or
significant stenosis.

Renals: Single right, with scattered calcified nonocclusive plaque.
Duplicated left, inferior dominant, with scattered calcified
nonocclusive plaque.

IMA: Patent, arising just proximal to the aneurysmal segment.

Inflow: On the right, patent stent through the proximal common iliac
artery. 1.7 cm dilatation distal common iliac artery. Plaque at the
external iliac origin with only mild stenosis. Patent stent extends
most of the length of the external iliac.

On the left, patent stent into the proximal common iliac. 3.3 cm
saccular aneurysm (previously 3.1) of the mid and distal common
iliac, containing extensive nonocclusive mural thrombus. Calcified
plaque at the external iliac origin and in its mid and distal
portions resulting in tandem areas of at least mild stenosis.

Proximal Outflow: Heavily calcified eccentric plaque in bilateral
common femoral arteries resulting in at least mild stenosis. Origin
occlusion of bilateral superficial femoral arteries.

Veins: Portal vein and bilateral renal veins patent. No venous
pathology identified

Review of the MIP images confirms the above findings.

NON-VASCULAR

Lower chest: No pleural or pericardial effusion.

Hepatobiliary: No focal liver abnormality is seen. No gallstones,
gallbladder wall thickening, or biliary dilatation.

Pancreas: Unremarkable. No pancreatic ductal dilatation or
surrounding inflammatory changes.

Spleen: Normal in size without focal abnormality.

Adrenals/Urinary Tract: Adrenal glands are unremarkable. Kidneys are
normal, without renal calculi, focal lesion, or hydronephrosis.
Bladder is unremarkable.

Stomach/Bowel: Stomach is nondistended. Small bowel is decompressed.
Appendix not identified. The colon is nondistended with scattered
descending and sigmoid diverticula; no significant adjacent
inflammatory change or abscess.

Lymphatic: No abdominal or pelvic adenopathy.

Reproductive: Mild prostate enlargement with central coarse
calcifications.

Other: No ascites.  No free air.

Musculoskeletal: No acute or significant osseous findings.
IMPRESSION: 1. 3.3 cm infrarenal abdominal aortic aneurysm (previously 3.2)
without complicating features. Recommend follow-up ultrasound every
3 years. This recommendation follows ACR consensus guidelines: White
Paper of the ACR Incidental Findings Committee II on Vascular
Findings. [HOSPITAL] 1294; [DATE].
2. Patent kissing stents across the aortic bifurcation into the
common iliac arteries.
3. 1.7 cm distal right common iliac artery aneurysm, and 3.3 cm
saccular aneurysm of the left common iliac artery (previously 3.1).
4. Patent right external iliac artery stent.
5. Chronic proximal occlusion of bilateral superficial femoral
arteries.
6. Descending and sigmoid diverticulosis.

## 2023-07-22 IMAGING — CT CT HEAD W/O CM
4 series · 16 of 47 positions shown, 18 images · non-contrast
Comparison: MRI 01/15/2017

CLINICAL DATA: Neuro deficit, acute, stroke suspected. Speech
disturbance over the last 3 weeks. Aphasia.



[Series 2: head wo · axial · 0.51mm/px · z∈[-117,+3]mm · 7 of 34 slices shown, 9 images]
[im 5/34  brain]
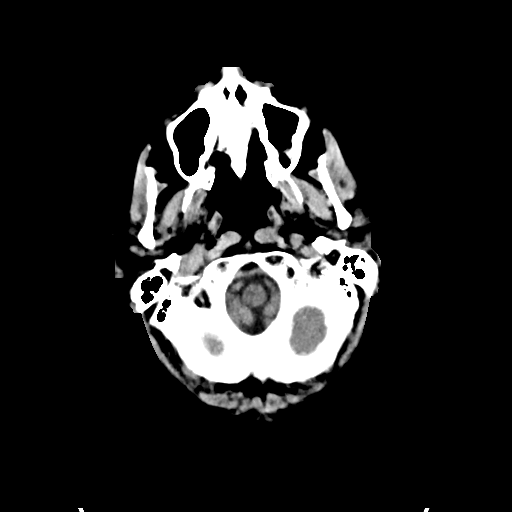
[im 5/34  bone]
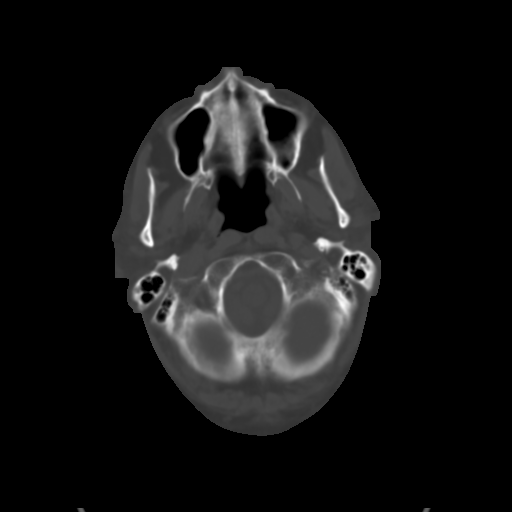
[im 9/34  brain]
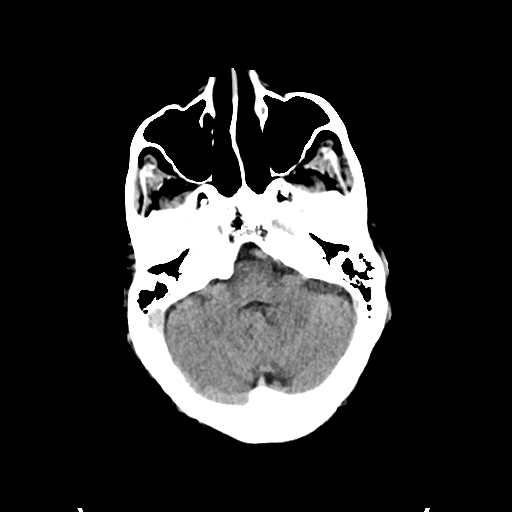
[im 13/34  brain]
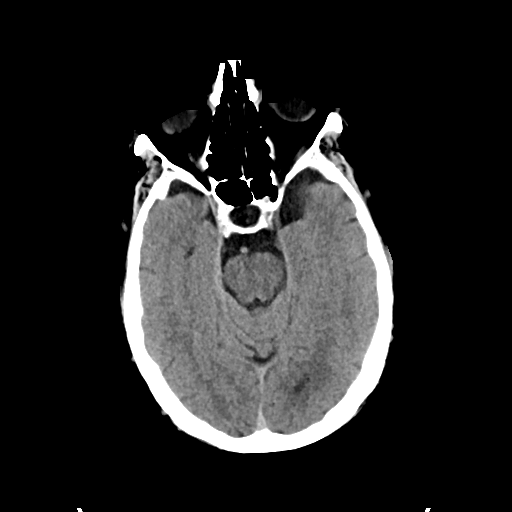
[im 17/34  brain]
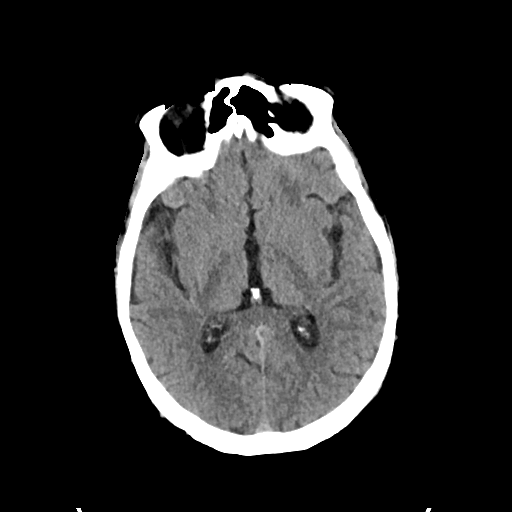
[im 21/34  brain]
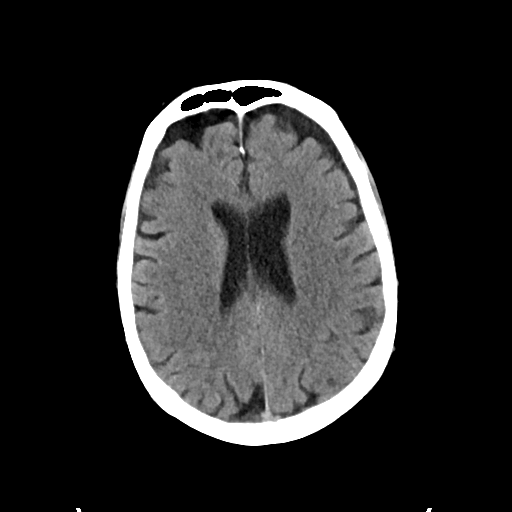
[im 21/34  bone]
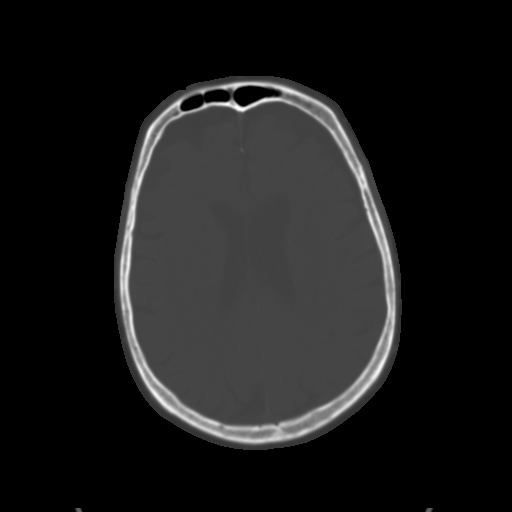
[im 25/34  brain]
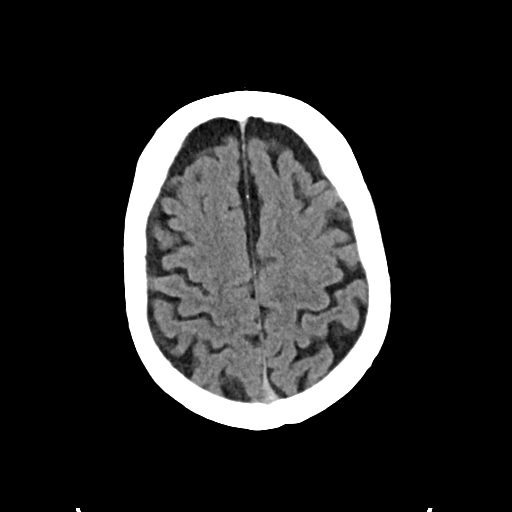
[im 29/34  brain]
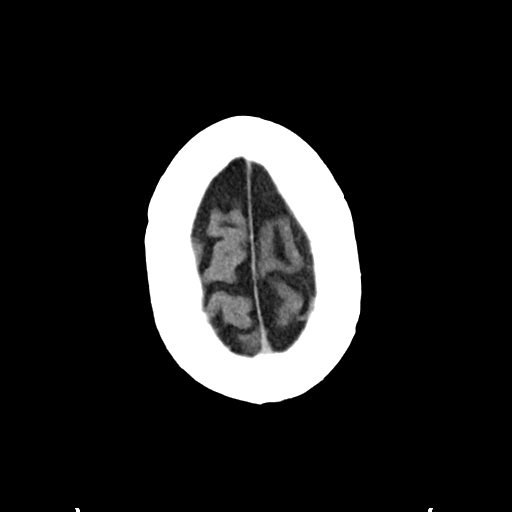

[Series 3: head bone · axial · 0.51mm/px · z∈[-121,-89]mm · 3 of 84 slices shown]
[im 9/84  bone]
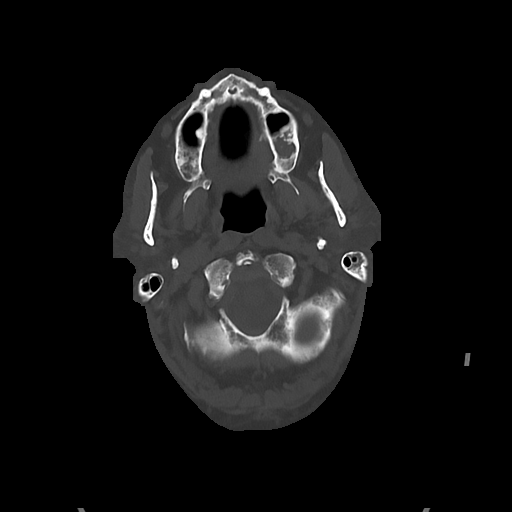
[im 17/84  bone]
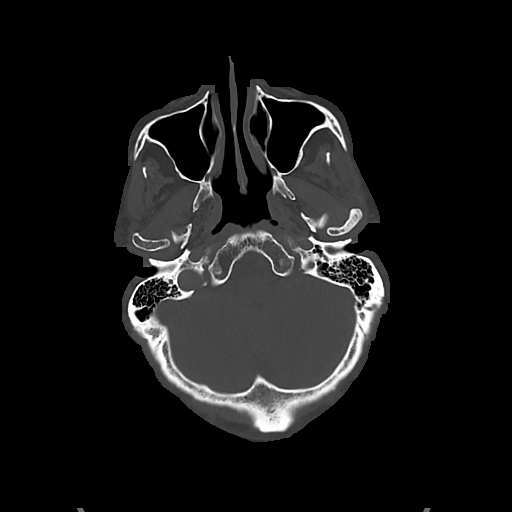
[im 25/84  bone]
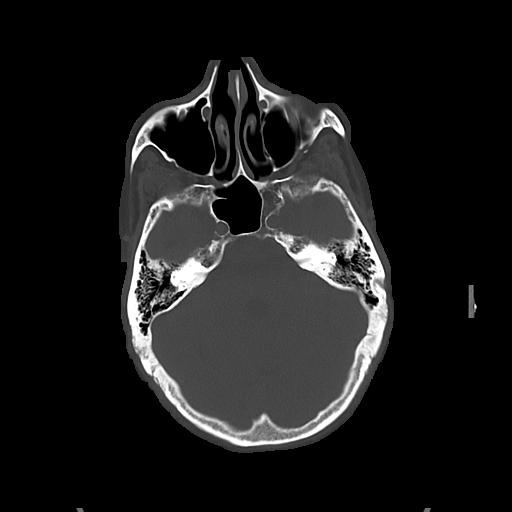

[Series 4: cor soft · coronal · 0.36mm/px · 3 of 78 slices shown]
[im 26/78  brain]
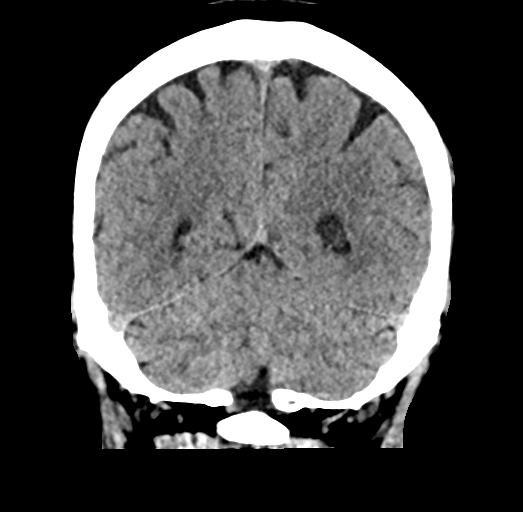
[im 35/78  brain]
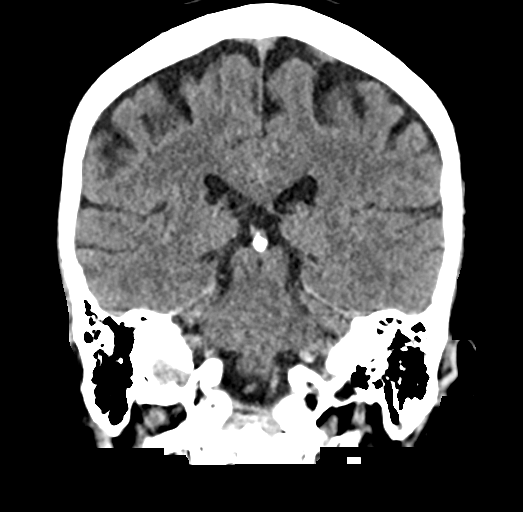
[im 43/78  brain]
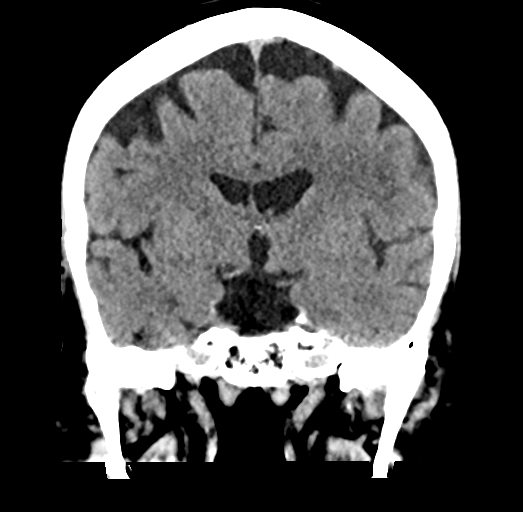

[Series 5: sag soft · sagittal · 0.36mm/px · 3 of 59 slices shown]
[im 20/59  brain]
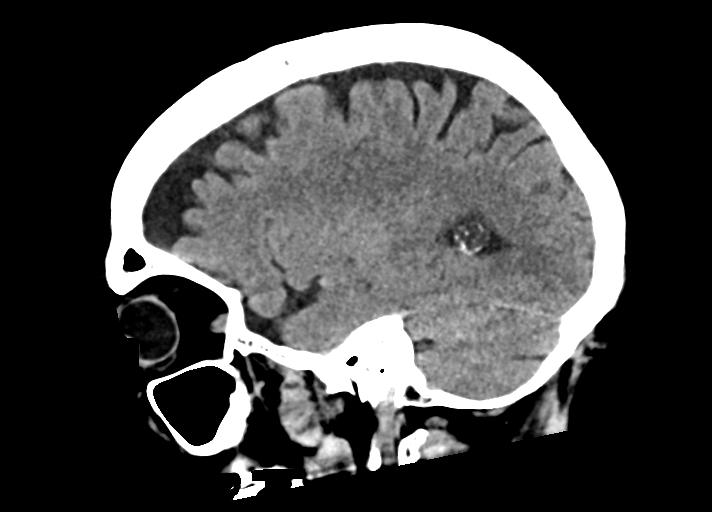
[im 30/59  brain]
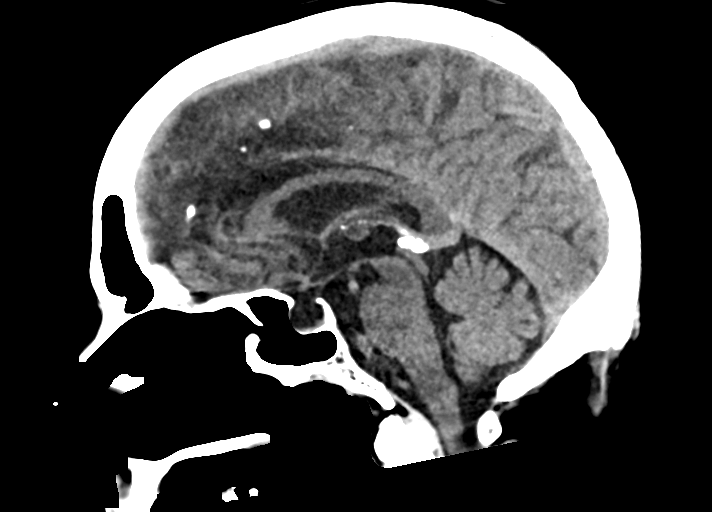
[im 39/59  brain]
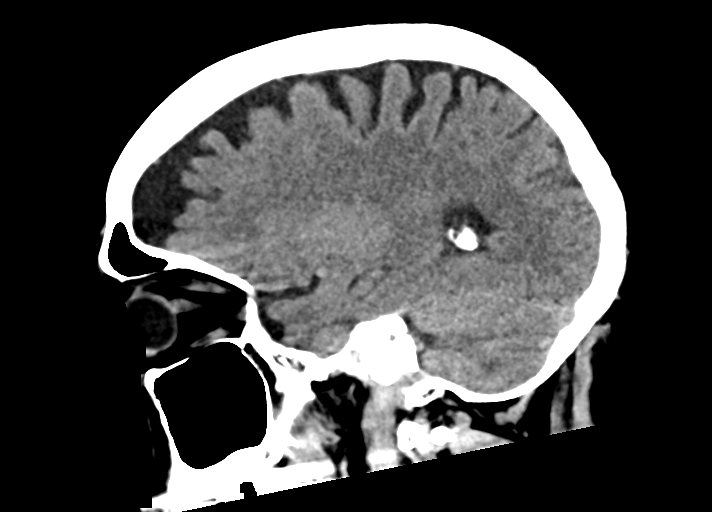

[16 of 47 positions shown; findings below may reference images not displayed]

FINDINGS: Brain: Mild generalized volume loss. No evidence of old or acute
focal infarction, mass lesion, hemorrhage, hydrocephalus or
extra-axial collection.

Vascular: There is atherosclerotic calcification of the major
vessels at the base of the brain.

Skull: Negative

Sinuses/Orbits: Clear/normal

Other: None
IMPRESSION: No acute, focal or reversible finding. Mild generalized age related
volume loss.
